# Patient Record
Sex: Female | Born: 1950
Health system: Southern US, Community
[De-identification: ages and names within clinical notes are randomized; demographics above are authoritative.]

## PROBLEM LIST (undated history)

## (undated) DIAGNOSIS — C20 Malignant neoplasm of rectum: Secondary | ICD-10-CM

## (undated) DIAGNOSIS — K921 Melena: Secondary | ICD-10-CM

## (undated) DIAGNOSIS — G4700 Insomnia, unspecified: Secondary | ICD-10-CM

## (undated) DIAGNOSIS — M1990 Unspecified osteoarthritis, unspecified site: Secondary | ICD-10-CM

## (undated) DIAGNOSIS — Z8042 Family history of malignant neoplasm of prostate: Secondary | ICD-10-CM

## (undated) DIAGNOSIS — R51 Headache: Secondary | ICD-10-CM

## (undated) DIAGNOSIS — Z5189 Encounter for other specified aftercare: Secondary | ICD-10-CM

## (undated) DIAGNOSIS — Z803 Family history of malignant neoplasm of breast: Secondary | ICD-10-CM

## (undated) DIAGNOSIS — Z800 Family history of malignant neoplasm of digestive organs: Secondary | ICD-10-CM

## (undated) DIAGNOSIS — R519 Headache, unspecified: Secondary | ICD-10-CM

## (undated) DIAGNOSIS — G8929 Other chronic pain: Secondary | ICD-10-CM

## (undated) DIAGNOSIS — N189 Chronic kidney disease, unspecified: Secondary | ICD-10-CM

## (undated) DIAGNOSIS — T7840XA Allergy, unspecified, initial encounter: Secondary | ICD-10-CM

## (undated) HISTORY — DX: Encounter for other specified aftercare: Z51.89

## (undated) HISTORY — DX: Family history of malignant neoplasm of breast: Z80.3

## (undated) HISTORY — DX: Headache, unspecified: R51.9

## (undated) HISTORY — DX: Allergy, unspecified, initial encounter: T78.40XA

## (undated) HISTORY — DX: Melena: K92.1

## (undated) HISTORY — DX: Unspecified osteoarthritis, unspecified site: M19.90

## (undated) HISTORY — DX: Family history of malignant neoplasm of prostate: Z80.42

## (undated) HISTORY — DX: Insomnia, unspecified: G47.00

## (undated) HISTORY — DX: Family history of malignant neoplasm of digestive organs: Z80.0

## (undated) HISTORY — PX: TONSILLECTOMY AND ADENOIDECTOMY: SUR1326

## (undated) HISTORY — PX: POLYPECTOMY: SHX149

## (undated) HISTORY — DX: Other chronic pain: G89.29

## (undated) HISTORY — PX: COLONOSCOPY: SHX174

## (undated) HISTORY — DX: Headache: R51

## (undated) HISTORY — PX: WISDOM TOOTH EXTRACTION: SHX21

## (undated) HISTORY — DX: Chronic kidney disease, unspecified: N18.9

---
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
## 1998-04-06 ENCOUNTER — Other Ambulatory Visit: Admission: RE | Admit: 1998-04-06 | Discharge: 1998-04-06 | Payer: Self-pay | Admitting: Obstetrics and Gynecology

## 1998-05-05 ENCOUNTER — Other Ambulatory Visit: Admission: RE | Admit: 1998-05-05 | Discharge: 1998-05-05 | Payer: Self-pay | Admitting: *Deleted

## 1998-05-13 ENCOUNTER — Other Ambulatory Visit: Admission: RE | Admit: 1998-05-13 | Discharge: 1998-05-13 | Payer: Self-pay | Admitting: Obstetrics and Gynecology

## 1999-03-17 ENCOUNTER — Encounter: Payer: Self-pay | Admitting: Family Medicine

## 1999-03-17 ENCOUNTER — Encounter: Admission: RE | Admit: 1999-03-17 | Discharge: 1999-03-17 | Payer: Self-pay | Admitting: Family Medicine

## 1999-05-04 ENCOUNTER — Other Ambulatory Visit: Admission: RE | Admit: 1999-05-04 | Discharge: 1999-05-04 | Payer: Self-pay | Admitting: Obstetrics and Gynecology

## 1999-05-04 ENCOUNTER — Encounter (INDEPENDENT_AMBULATORY_CARE_PROVIDER_SITE_OTHER): Payer: Self-pay

## 2000-06-20 ENCOUNTER — Other Ambulatory Visit: Admission: RE | Admit: 2000-06-20 | Discharge: 2000-06-20 | Payer: Self-pay | Admitting: Obstetrics and Gynecology

## 2001-07-02 ENCOUNTER — Other Ambulatory Visit: Admission: RE | Admit: 2001-07-02 | Discharge: 2001-07-02 | Payer: Self-pay | Admitting: Obstetrics and Gynecology

## 2003-03-09 ENCOUNTER — Other Ambulatory Visit: Admission: RE | Admit: 2003-03-09 | Discharge: 2003-03-09 | Payer: Self-pay | Admitting: Obstetrics and Gynecology

## 2004-03-09 ENCOUNTER — Other Ambulatory Visit: Admission: RE | Admit: 2004-03-09 | Discharge: 2004-03-09 | Payer: Self-pay | Admitting: Obstetrics and Gynecology

## 2008-01-28 ENCOUNTER — Ambulatory Visit: Payer: Self-pay | Admitting: Gastroenterology

## 2008-02-10 ENCOUNTER — Ambulatory Visit: Payer: Self-pay | Admitting: Gastroenterology

## 2010-01-02 DIAGNOSIS — Z5189 Encounter for other specified aftercare: Secondary | ICD-10-CM

## 2010-01-02 HISTORY — DX: Encounter for other specified aftercare: Z51.89

## 2010-01-02 HISTORY — PX: COLON SURGERY: SHX602

## 2010-07-25 ENCOUNTER — Encounter: Payer: Self-pay | Admitting: *Deleted

## 2010-07-25 ENCOUNTER — Telehealth: Payer: Self-pay | Admitting: Gastroenterology

## 2010-07-25 NOTE — Telephone Encounter (Signed)
Pt's last COLON 02/10/2008 without any problems or polyps, only family hx as a risk factor. Pt reports she's had a very tiny amount of blood from her rectum and her GYN thought it might be a fistula or fissure. Pt denies any pain and asked for an appt in late August and stated she will cancel if she gets better. Pt given an appt for 08/26/10 at 0900 and encouraged to keep it d/t the bleeding- new onset. Pt stated understanding.

## 2010-08-26 ENCOUNTER — Ambulatory Visit: Payer: Self-pay | Admitting: Gastroenterology

## 2010-09-02 ENCOUNTER — Encounter: Payer: Self-pay | Admitting: Gastroenterology

## 2010-09-02 ENCOUNTER — Ambulatory Visit (INDEPENDENT_AMBULATORY_CARE_PROVIDER_SITE_OTHER): Payer: BC Managed Care – PPO | Admitting: Gastroenterology

## 2010-09-02 VITALS — BP 110/76 | HR 96 | Ht 66.0 in | Wt 135.2 lb

## 2010-09-02 DIAGNOSIS — K625 Hemorrhage of anus and rectum: Secondary | ICD-10-CM

## 2010-09-02 DIAGNOSIS — Z800 Family history of malignant neoplasm of digestive organs: Secondary | ICD-10-CM | POA: Insufficient documentation

## 2010-09-02 NOTE — Patient Instructions (Signed)
Please go to the basement today for your labs.  Make sure you return the Ifob kit within 30 days or you will be charged for the kit.

## 2010-09-02 NOTE — Progress Notes (Signed)
This is a very pleasant 60 year old Caucasian female with a family history of colon cancer and colon polyps in several family members at elderly ages. She's had 2 negative colonoscopies, the last one performed 2 years ago. She comes in today because of some asymptomatic rectal bleeding several weeks ago. She currently is asymptomatic and denies rectal or abdominal pain, or any symptoms of anemia, upper GI or hepatobiliary complaints. Her appetite is good and her weight is stable. She has chronic headaches but otherwise is in excellent health. She has yearly physical exams by Dr. Theadora Rama and Dr. Marcelle Overlie in gynecology.  Current Medications, Allergies, Past Medical History, Past Surgical History, Family History and Social History were reviewed in [employer] record.  Pertinent Review of Systems Negative   Physical Exam: Awake alert no acute distress appearing her stated age. I cannot appreciate stigmata of chronic liver disease. Chest is clear cardiac exam is unremarkable with a regular rhythm. There is no organomegaly, abdominal masses or tenderness. Inspection of rectum is unremarkable as is rectal exam. There is solid stool present which is guaiac negative. Extremities unremarkable, and mental status is normal.    Assessment and Plan: Probable minor bleeding from internal hemorrhoids. We will check IFOB course for human hemoglobin in her stool, also CBC and anemia profile. I do not think she needs colonoscopy at this time unless her bleeding continues or her stool cards are positive. Otherwise we'll followup her colonoscopy as per clinical protocol.  Please copy her primary care physician, referring physician, and pertinent subspecialists. Encounter Diagnosis  Name Primary?  . Rectal bleeding Yes

## 2010-09-20 ENCOUNTER — Telehealth: Payer: Self-pay | Admitting: Gastroenterology

## 2010-09-20 NOTE — Telephone Encounter (Signed)
Pt called in to report another episode of blood in her stool. Pt saw Dr Jarold Motto on 09/02/10 for rectal bleeding. She has had 2 - COLONS before and Dr Jarold Motto was waiting on IFOB testing and labs before making a decision. Pt reports she had her labs faxed to [place]- they are under MEDIA from [place]. Called the lab and d/t the system being down this am, they are running behind. They hope her results will be ready tomorrow; they do have her cards. Explained this to pt. She reports she went for days w/o any blood, but yesterday, the blood was intertwined in her stool. Will call after discussing lab results with Dr Jarold Motto; pt stated understanding.

## 2010-09-22 ENCOUNTER — Other Ambulatory Visit: Payer: Self-pay | Admitting: Gastroenterology

## 2010-09-22 ENCOUNTER — Telehealth: Payer: Self-pay | Admitting: *Deleted

## 2010-09-22 ENCOUNTER — Other Ambulatory Visit: Payer: BC Managed Care – PPO

## 2010-09-22 DIAGNOSIS — K625 Hemorrhage of anus and rectum: Secondary | ICD-10-CM

## 2010-09-22 NOTE — Telephone Encounter (Signed)
Informed pt Dr Jarold Motto suggests ECL for pt. She will have her PRE VISIT on 09/29/10 at 0800 and her ECL on 10/17/10 at 0900. Pt stated understanding.

## 2010-09-22 NOTE — Telephone Encounter (Signed)
IFOB results were not read on 09/20/10. Pt call this am and results were positive per lab tech, but not entered; tech will have them resulted. Notified pt I will call her back after speaking with Dr Jarold Motto.  Dr Jarold Motto, pt's labs are under "MEDIA" and take the filter off.  Pt reports blood intertwined in the stool Monday and Tuesday of this week. Per 09/02/10 OV: Assessment and Plan: Probable minor bleeding from internal hemorrhoids. We will check IFOB course for human hemoglobin in her stool, also CBC and anemia profile. I do not think she needs colonoscopy at this time unless her bleeding continues or her stool cards are positive. Otherwise we'll followup her colonoscopy as per clinical protocol.  Please advise.

## 2010-09-22 NOTE — Telephone Encounter (Signed)
Message copied by Florene Glen on Thu Sep 22, 2010  3:54 PM ------      Message from: Jarold Motto, DAVID R      Created: Thu Sep 22, 2010 11:46 AM       ++ stool..needs endo/colon

## 2010-10-11 ENCOUNTER — Ambulatory Visit (AMBULATORY_SURGERY_CENTER): Payer: BC Managed Care – PPO

## 2010-10-11 VITALS — Ht 66.0 in | Wt 136.1 lb

## 2010-10-11 DIAGNOSIS — Z800 Family history of malignant neoplasm of digestive organs: Secondary | ICD-10-CM

## 2010-10-11 DIAGNOSIS — K625 Hemorrhage of anus and rectum: Secondary | ICD-10-CM

## 2010-10-11 MED ORDER — PEG-KCL-NACL-NASULF-NA ASC-C 100 G PO SOLR: 1.0000 | Freq: Once | ORAL | Status: AC

## 2010-10-17 ENCOUNTER — Ambulatory Visit (AMBULATORY_SURGERY_CENTER): Payer: BC Managed Care – PPO | Admitting: Gastroenterology

## 2010-10-17 ENCOUNTER — Encounter: Payer: Self-pay | Admitting: Gastroenterology

## 2010-10-17 VITALS — BP 122/75 | HR 77 | Temp 98.4°F | Resp 15 | Ht 66.0 in | Wt 136.0 lb

## 2010-10-17 DIAGNOSIS — D371 Neoplasm of uncertain behavior of stomach: Secondary | ICD-10-CM

## 2010-10-17 DIAGNOSIS — Z8371 Family history of colonic polyps: Secondary | ICD-10-CM

## 2010-10-17 DIAGNOSIS — D375 Neoplasm of uncertain behavior of rectum: Secondary | ICD-10-CM

## 2010-10-17 DIAGNOSIS — K625 Hemorrhage of anus and rectum: Secondary | ICD-10-CM

## 2010-10-17 DIAGNOSIS — Z800 Family history of malignant neoplasm of digestive organs: Secondary | ICD-10-CM

## 2010-10-17 DIAGNOSIS — D126 Benign neoplasm of colon, unspecified: Secondary | ICD-10-CM

## 2010-10-17 DIAGNOSIS — D378 Neoplasm of uncertain behavior of other specified digestive organs: Secondary | ICD-10-CM

## 2010-10-17 MED ORDER — SODIUM CHLORIDE 0.9 % IV SOLN: 500.0000 mL | INTRAVENOUS | Status: DC

## 2010-10-17 NOTE — Patient Instructions (Signed)
Please review discharge instructions (blue and green sheets)  Await pathology- Dr. Jarold Motto will get in touch with you either via letter or phone call as soon as we receive the pathology results.  He will also instruct you as to when you will need to have another procedure for this polyp  Resume normal medications

## 2010-10-17 NOTE — Progress Notes (Signed)
Pt passed large amount of air while in RR

## 2010-10-18 ENCOUNTER — Telehealth: Payer: Self-pay | Admitting: *Deleted

## 2010-10-18 NOTE — Telephone Encounter (Signed)
No answer. No ID on answering machine, no message left. 

## 2010-10-19 ENCOUNTER — Telehealth: Payer: Self-pay | Admitting: Gastroenterology

## 2010-10-19 DIAGNOSIS — K6289 Other specified diseases of anus and rectum: Secondary | ICD-10-CM

## 2010-10-19 NOTE — Telephone Encounter (Signed)
lmom for pt to call back. Polyp found was an adenoma that are sometimes pre cancerous and require more frequent observation- more frequent COLONs , 3 or 5 year surveillance instead of 10 years. The path report usually takes 5-7 days for completion; pt may call back for more questions.

## 2010-10-20 NOTE — Telephone Encounter (Signed)
Pt did not call back; we will call her when path is back.

## 2010-10-21 ENCOUNTER — Telehealth: Payer: Self-pay | Admitting: *Deleted

## 2010-10-21 NOTE — Telephone Encounter (Signed)
Phoned pt and she prefers Dr Abbey Chatters; r/s for 11/15/10 at 1:40pm. Pt will pick up contrast with instructions today.

## 2010-10-21 NOTE — Telephone Encounter (Signed)
NO CONCERN..SHE MAY SEE HEME.Marland KitchenMarland Kitchen

## 2010-10-21 NOTE — Telephone Encounter (Signed)
Per Dr Jarold Motto, order CT Abd/Pelvis and make a surgical referral; Dr Jarold Motto spoke with the pt. CT scan ordered for Monday, 10/24/10 at 0900am, NPO 4 hrs prior, Drink contast 1 bottle at 7am and the 2nd at 8am. Surgical referral with Dr Rayburn Ma for 10/25/10 at 0920am.

## 2010-10-21 NOTE — Telephone Encounter (Signed)
Pt stated dr Jarold Motto asked her this am if she had seen any blood and she reported no. At lunch, after her BM, there were small clots on the tissue and the BM was streaked with blood. Any advice? Thanks.

## 2010-10-24 ENCOUNTER — Ambulatory Visit (INDEPENDENT_AMBULATORY_CARE_PROVIDER_SITE_OTHER)
Admission: RE | Admit: 2010-10-24 | Discharge: 2010-10-24 | Disposition: A | Discharge status: Home / Self Care | Payer: BC Managed Care – PPO | Source: Ambulatory Visit | Attending: Gastroenterology | Admitting: Gastroenterology

## 2010-10-24 DIAGNOSIS — K6289 Other specified diseases of anus and rectum: Secondary | ICD-10-CM

## 2010-10-24 DIAGNOSIS — R198 Other specified symptoms and signs involving the digestive system and abdomen: Secondary | ICD-10-CM

## 2010-10-24 MED ORDER — IOHEXOL 300 MG/ML  SOLN
100.0000 mL | Freq: Once | INTRAMUSCULAR | Status: AC | PRN
  Administered 2010-10-24: 80 mL via INTRAVENOUS

## 2010-10-24 NOTE — Telephone Encounter (Signed)
Pt called to report she has diarrhea after the contrast. She asked about her CT scan, but there was no mention of the mass in the colon. Dr Bonnielee Haff will lokk at the scan and append per Erskine Squibb at [phone].

## 2010-10-25 ENCOUNTER — Ambulatory Visit (INDEPENDENT_AMBULATORY_CARE_PROVIDER_SITE_OTHER): Payer: Self-pay | Admitting: Surgery

## 2010-10-25 NOTE — Telephone Encounter (Addendum)
Dr Bonnielee Haff reviewed and made an addendum to the CT scan stating, "the rectal mass is not visualized". Notified the pt of this and she did not like the answer stating she did not understand how a 5cm mass could not show up on a CT scan. I could not reach a Radiologist in the viewing room, so I consulted Dr Rhea Belton who informed me that is why they use Colonoscopies for screening Colon Cancer rather than CT scan. Informed the pt of this and she stated it's hard for her to believe a 5cm mass did not show up. Explained to pt Dr Jarold Motto could not get the entire mass safely thru a scope, that's why he wants a surgeon to remove it entirely. The biopsies- 4 of them- were large and we don't know how much of the mass is left. We do not want to tell it's not cancer if we're uncertain. Pt still did not like my answers and I understand the pt's concern; Dr Rhea Belton spoke with pt and was able to answer her questions.

## 2010-11-01 ENCOUNTER — Ambulatory Visit (INDEPENDENT_AMBULATORY_CARE_PROVIDER_SITE_OTHER): Payer: BC Managed Care – PPO | Admitting: Surgery

## 2010-11-01 ENCOUNTER — Encounter (INDEPENDENT_AMBULATORY_CARE_PROVIDER_SITE_OTHER): Payer: Self-pay | Admitting: Surgery

## 2010-11-01 VITALS — BP 108/82 | HR 70 | Temp 98.1°F | Resp 20 | Ht 66.0 in | Wt 136.1 lb

## 2010-11-01 DIAGNOSIS — C20 Malignant neoplasm of rectum: Secondary | ICD-10-CM | POA: Insufficient documentation

## 2010-11-01 DIAGNOSIS — D128 Benign neoplasm of rectum: Secondary | ICD-10-CM

## 2010-11-01 HISTORY — DX: Malignant neoplasm of rectum: C20

## 2010-11-01 NOTE — Progress Notes (Signed)
Subjective:     Patient ID: Natasha Campbell, female   DOB: 1950/02/29, 60 y.o.   MRN: [id number]  HPI  Patient Care Team: Charolett Bumpers as PCP - General (Unknown Physician Specialty) Sheryn Bison, MD as Consulting Physician (Gastroenterology) Ardeth Sportsman, MD as Consulting Physician (General Surgery)  This patient is a 60 y.o.female who presents today for surgical evaluation.   Reason for visit: Rectal polyp with high-grade dysplasia in the proximal rectum. Consideration of surgery.  The patient is a pleasant healthy woman who had intermittent rectal bleeding for the past few months. It worsened. She had a normal colonoscopy 3 years ago. She was begun earlier this month. Dr. Jarold Motto found a 4.5 cm mass in the proximal rectum near the superior rectal fold.  He was able to reduce some of the piecemeal. Pathology showed tubulovillous adenoma with high-grade dysplasia. He sent the patient to me for surgical evaluation.  Patient walks several miles w/o difficulty. She normally has a bowel movement about every day. No prior abdominal surgeries. She comes today with her husband. There is family history of GI cancer. Her. No history of breast ovarian or other GYN cancers. Otherwise quite healthy and active.  Past Medical History  Diagnosis Date  . Family history of malignant neoplasm of gastrointestinal tract   . Chronic headaches   . Arthritis   . Blood in stool     Past Surgical History  Procedure Date  . Tonsillectomy and adenoidectomy     History   Social History  . Marital Status: Married    Spouse Name: N/A    Number of Children: 2  . Years of Education: N/A   Occupational History  .  [employer]   Social History Main Topics  . Smoking status: Never Smoker   . Smokeless tobacco: Never Used  . Alcohol Use: No  . Drug Use: No  . Sexually Active: Not on file   Other Topics Concern  . Not on file   Social History Narrative  . No narrative on file    Family  History  Problem Relation Age of Onset  . Colon cancer Paternal Grandmother   . Colon cancer Paternal Grandfather   . Colon cancer Maternal Grandmother   . Heart disease Mother   . Heart disease Father     Current outpatient prescriptions:Aspirin-Acetaminophen-Caffeine (EXCEDRIN PO), Take 1 tablet by mouth as needed.  , Disp: , Rfl: ;  Calcium Citrate-Vitamin D (CALCIUM CITRATE + PO), Take by mouth daily. Take 1200 mg daily , Disp: , Rfl: ;  cholecalciferol (VITAMIN D) 1000 UNITS tablet, Take 1,000 Units by mouth daily.  , Disp: , Rfl: ;  Coenzyme Q10 (CO Q-10 PO), Take by mouth daily. Take 100 mg daily , Disp: , Rfl:  fish oil-omega-3 fatty acids 1000 MG capsule, Take 1 g by mouth daily. Take 1200 mg daily , Disp: , Rfl: ;  imipramine (TOFRANIL) 10 MG tablet, Take 10 mg by mouth at bedtime.  , Disp: , Rfl: ;  Magnesium 500 MG CAPS, Take by mouth daily.  , Disp: , Rfl: ;  Multiple Vitamin (MULTIVITAMIN) tablet, Take 1 tablet by mouth daily.  , Disp: , Rfl: ;  NON FORMULARY, Zonisamide 150 mg 1 tablet daily , Disp: , Rfl:  NON FORMULARY, Flurbiprofen 100 1 as needed  , Disp: , Rfl: ;  rizatriptan (MAXALT) 10 MG tablet, Take 10 mg by mouth as needed. May repeat in 2 hours if needed , Disp: ,  Rfl: ;  Selenium 100 MCG TABS, Take by mouth daily.  , Disp: , Rfl: ;  vitamin E 100 UNIT capsule, Take 100 Units by mouth daily.  , Disp: , Rfl: ;  zolpidem (AMBIEN) 10 MG tablet, Take 10 mg by mouth at bedtime as needed.  , Disp: , Rfl:   No Known Allergies     Review of Systems  Constitutional: Negative for fever, chills, diaphoresis, appetite change and fatigue.  HENT: Negative for ear pain, sore throat, trouble swallowing, neck pain and ear discharge.   Eyes: Negative for photophobia, discharge and visual disturbance.  Respiratory: Negative for cough, choking, chest tightness and shortness of breath.   Cardiovascular: Negative for chest pain and palpitations.  Gastrointestinal: Positive for blood in  stool. Negative for nausea, vomiting, abdominal pain, diarrhea, constipation, abdominal distention, anal bleeding and rectal pain.       BM q 1-2d  Genitourinary: Negative for dysuria, frequency and difficulty urinating.  Musculoskeletal: Negative for myalgias and gait problem.  Skin: Negative for color change, pallor and rash.  Neurological: Negative for dizziness, speech difficulty, weakness and numbness.  Hematological: Negative for adenopathy.  Psychiatric/Behavioral: Negative for confusion and agitation. The patient is not nervous/anxious.        Objective:   Physical Exam  Constitutional: She is oriented to person, place, and time. She appears well-developed and well-nourished. No distress.  HENT:  Head: Normocephalic.  Mouth/Throat: Oropharynx is clear and moist. No oropharyngeal exudate.  Eyes: Conjunctivae and EOM are normal. Pupils are equal, round, and reactive to light. No scleral icterus.  Neck: Normal range of motion. Neck supple. No tracheal deviation present.  Cardiovascular: Normal rate, regular rhythm and intact distal pulses.   Pulmonary/Chest: Effort normal and breath sounds normal. No respiratory distress. She exhibits no tenderness.  Abdominal: Soft. She exhibits no distension and no mass. There is no tenderness. Hernia confirmed negative in the right inguinal area and confirmed negative in the left inguinal area.  Genitourinary: Vagina normal. No vaginal discharge found.       Perianal skin clean with good hygiene.  Mild hypopigmentation. No pruritis/rash.  No fissure.  No abscess/fistula.  No pilonidal disease.    Tolerates digital rectal exam.  Normal sphincter tone.  No rectal masses.  Mild fullness at tip anterior ly ?cervix.  Hemorrhoidal piles WNL   Musculoskeletal: Normal range of motion. She exhibits no tenderness.  Lymphadenopathy:    She has no cervical adenopathy.       Right: No inguinal adenopathy present.       Left: No inguinal adenopathy present.    Neurological: She is alert and oriented to person, place, and time. No cranial nerve deficit. She exhibits normal muscle tone. Coordination normal.  Skin: Skin is warm and dry. No rash noted. She is not diaphoretic. No erythema.  Psychiatric: She has a normal mood and affect. Her behavior is normal. Judgment and thought content normal.       Assessment:     Large proximal rectal polyp with high-grade dysplasia. Need for more definitive removal.    Plan:     Given that I cannot feel on digital exam and is quite proximal, I do not think she would be a good candidate for TEM. I think she would benefit from segmental rectosigmoid resection. The CAT scan does not show any evidence of metastatic disease. I think I see the fullness up in the mid body of the uterus implying that this is quite at the rectosigmoid  junction. I worried a peritoneal breech I tried to do it by TEM.  I did discuss doing a low anterior resection. I think should be an excellent candidate for laparoscopic technique. She had numerous questions and her husband as well. I believe I answered to their satisfaction.  The anatomy & physiology of the digestive tract was discussed.  The pathophysiology was discussed.  Natural history risks without surgery was discussed.   I feel the risks of no intervention will lead to serious problems that outweigh the operative risks; therefore, I recommended a partial colectomy to remove the pathology.  Laparoscopic & open techniques were discussed.   Risks such as bleeding, infection, abscess, leak, reoperation, possible ostomy, hernia, heart attack, death, and other risks were discussed.  Goals of post-operative recovery were discussed as well.  We will work to minimize complications.  An educational handout on the pathology was given as well.  Questions were answered.  The patient expresses understanding & wishes to proceed with surgery.

## 2010-11-01 NOTE — Patient Instructions (Signed)
Colon Polyps A polyp is extra tissue that grows inside your body. Colon polyps grow in the large intestine. The large intestine, also called the colon, is part of your digestive system. It is a long, hollow tube at the end of your digestive tract where your body makes and stores stool. Most polyps are not dangerous. They are benign. This means they are not cancerous. But over time, some types of polyps can turn into cancer. Polyps that are smaller than a pea are usually not harmful. But larger polyps could someday become or may already be cancerous. To be safe, doctors remove all polyps and test them.  WHO GETS POLYPS? Anyone can get polyps, but certain people are more likely than others. You may have a greater chance of getting polyps if:  You are over 50.   You have had polyps before.   Someone in your family has had polyps.   Someone in your family has had cancer of the large intestine.   Find out if someone in your family has had polyps. You may also be more likely to get polyps if you:   Eat a lot of fatty foods.   Smoke.   Drink alcohol.   Do not exercise.   Eat too much.  SYMPTOMS  Most small polyps do not cause symptoms. People often do not know they have one until their caregiver finds it during a regular checkup or while testing them for something else. Some people do have symptoms like these:  Bleeding from the anus. You might notice blood on your underwear or on toilet paper after you have had a bowel movement.   Constipation or diarrhea that lasts more than a week.   Blood in the stool. Blood can make stool look black or it can show up as red streaks in the stool.  If you have any of these symptoms, see your caregiver. HOW DOES THE DOCTOR TEST FOR POLYPS? The doctor can use four tests to check for polyps:  Digital rectal exam. The caregiver wears gloves and checks your rectum (the last part of the large intestine) to see if it feels normal. This test would find  polyps only in the rectum. Your caregiver may need to do one of the other tests listed below to find polyps higher up in the intestine.   Barium enema. The caregiver puts a liquid called barium into your rectum before taking x-rays of your large intestine. Barium makes your intestine look white in the pictures. Polyps are dark, so they are easy to see.   Sigmoidoscopy. With this test, the caregiver can see inside your large intestine. A thin flexible tube is placed into your rectum. The device is called a sigmoidoscope, which has a light and a tiny video camera in it. The caregiver uses the sigmoidoscope to look at the last third of your large intestine.   Colonoscopy. This test is like sigmoidoscopy, but the caregiver looks at all of the large intestine. It usually requires sedation. This is the most common method for finding and removing polyps.  TREATMENT   The caregiver will remove the polyp during sigmoidoscopy or colonoscopy. The polyp is then tested for cancer.   If you have had polyps, your caregiver may want you to get tested regularly in the future.  PREVENTION  There is not one sure way to prevent polyps. You might be able to lower your risk of getting them if you:  Eat more fruits and vegetables and less fatty   food.   Do not smoke.   Avoid alcohol.   Exercise every day.   Lose weight if you are overweight.   Eating more calcium and folate can also lower your risk of getting polyps. Some foods that are rich in calcium are milk, cheese, and broccoli. Some foods that are rich in folate are chickpeas, kidney beans, and spinach.   Aspirin might help prevent polyps. Studies are under way.  Document Released: 09/15/2003 Document Revised: 08/31/2010 Document Reviewed: 02/20/2007 [employer] Patient Information 2012 [employer]. 

## 2010-11-15 ENCOUNTER — Ambulatory Visit (INDEPENDENT_AMBULATORY_CARE_PROVIDER_SITE_OTHER): Payer: BC Managed Care – PPO | Admitting: General Surgery

## 2010-11-16 ENCOUNTER — Telehealth (INDEPENDENT_AMBULATORY_CARE_PROVIDER_SITE_OTHER): Payer: Self-pay

## 2010-11-16 NOTE — Telephone Encounter (Signed)
LMOM for pt to call me back about her message left on Monday for Dr Michaell Cowing [facility]

## 2010-11-21 ENCOUNTER — Encounter (HOSPITAL_COMMUNITY)
Admission: RE | Admit: 2010-11-21 | Discharge: 2010-11-21 | Disposition: A | Discharge status: Home / Self Care | Payer: BC Managed Care – PPO | Source: Ambulatory Visit | Attending: Surgery | Admitting: Surgery

## 2010-11-21 ENCOUNTER — Encounter (HOSPITAL_COMMUNITY): Payer: Self-pay

## 2010-11-21 HISTORY — DX: Headache: R51

## 2010-11-21 LAB — SURGICAL PCR SCREEN
MRSA, PCR: NEGATIVE
Staphylococcus aureus: POSITIVE — AB

## 2010-11-21 LAB — CBC
Hemoglobin: 13 g/dL (ref 12.0–15.0)
MCH: 29.7 pg (ref 26.0–34.0)
Platelets: 191 10*3/uL (ref 150–400)
RBC: 4.37 MIL/uL (ref 3.87–5.11)
WBC: 4.7 10*3/uL (ref 4.0–10.5)

## 2010-11-21 NOTE — Patient Instructions (Signed)
20 Natasha Campbell  11/21/2010   Your procedure is scheduled on:  Fri. 12/02/2010  Report to [facility] at 0530 AM.  Call this number if you have problems the morning of surgery: [phone]   Remember:Follow rectal prep from Dr. Gordy Savers office    Do not eat food:After Midnight.  Do not drink clear liquids: After Midnight.  Take these medicines the morning of surgery with A SIP OF WATER: NONE   Do not wear jewelry, make-up or nail polish.  Do not wear lotions, powders, or perfumes.   Do not shave 48 hours prior to surgery.  Do not bring valuables to the hospital.  Contacts, dentures or bridgework may not be worn into surgery.  Leave suitcase in the car. After surgery it may be brought to your room.  For patients admitted to the hospital, checkout time is 11:00 AM the day of discharge.   Patients discharged the day of surgery will not be allowed to drive home.  Name and phone number of your driver:   Special Instructions: CHG Shower Use Special Wash: 1/2 bottle night before surgery and 1/2 bottle morning of surgery.   Please read over the following fact sheets that you were given: MRSA Information

## 2010-11-22 ENCOUNTER — Other Ambulatory Visit (HOSPITAL_COMMUNITY): Payer: BC Managed Care – PPO

## 2010-12-01 MED ORDER — BUPIVACAINE 0.25 % ON-Q PUMP DUAL CATH 300 ML
300.0000 mL | INJECTION | Status: DC
  Administered 2010-12-02: 300 mL
  Filled 2010-12-01: qty 300

## 2010-12-01 MED ORDER — SODIUM CHLORIDE 0.9 % IV SOLN
1.0000 g | INTRAVENOUS | Status: DC
  Filled 2010-12-01: qty 1

## 2010-12-01 NOTE — Anesthesia Preprocedure Evaluation (Addendum)
Anesthesia Evaluation  Patient identified by MRN, date of birth, ID band Patient awake    Reviewed: Allergy & Precautions, H&P , NPO status , Patient's Chart, lab work & pertinent test results  Airway Mallampati: II TM Distance: >3 FB Neck ROM: Full    Dental No notable dental hx. (+) Teeth Intact and Dental Advisory Given   Pulmonary neg pulmonary ROS, Recent URI ,  clear to auscultation  Pulmonary exam normal       Cardiovascular neg cardio ROS Regular Normal    Neuro/Psych  Headaches, Negative Neurological ROS  Negative Psych ROS   GI/Hepatic negative GI ROS, Neg liver ROS,   Endo/Other  Negative Endocrine ROS  Renal/GU negative Renal ROS  Genitourinary negative   Musculoskeletal negative musculoskeletal ROS (+)   Abdominal   Peds negative pediatric ROS (+)  Hematology negative hematology ROS (+)   Anesthesia Other Findings   Reproductive/Obstetrics negative OB ROS                        Anesthesia Physical Anesthesia Plan  ASA: I  Anesthesia Plan: General   Post-op Pain Management:    Induction: Intravenous  Airway Management Planned: Oral ETT  Additional Equipment:   Intra-op Plan:   Post-operative Plan: Extubation in OR  Informed Consent: I have reviewed the patients History and Physical, chart, labs and discussed the procedure including the risks, benefits and alternatives for the proposed anesthesia with the patient or authorized representative who has indicated his/her understanding and acceptance.   Dental advisory given  Plan Discussed with: CRNA and Surgeon  Anesthesia Plan Comments:        Anesthesia Quick Evaluation

## 2010-12-02 ENCOUNTER — Encounter (HOSPITAL_COMMUNITY): Payer: Self-pay | Admitting: *Deleted

## 2010-12-02 ENCOUNTER — Other Ambulatory Visit (INDEPENDENT_AMBULATORY_CARE_PROVIDER_SITE_OTHER): Payer: Self-pay | Admitting: Surgery

## 2010-12-02 ENCOUNTER — Encounter (HOSPITAL_COMMUNITY): Payer: Self-pay | Admitting: Anesthesiology

## 2010-12-02 ENCOUNTER — Inpatient Hospital Stay (HOSPITAL_COMMUNITY)
Admission: RE | Admit: 2010-12-02 | Discharge: 2010-12-06 | DRG: 146 | Disposition: A | Discharge status: Home / Self Care | Payer: BC Managed Care – PPO | Source: Ambulatory Visit | Attending: Surgery | Admitting: Surgery

## 2010-12-02 ENCOUNTER — Encounter (HOSPITAL_COMMUNITY)
Admission: RE | Disposition: A | Discharge status: Home / Self Care | Payer: Self-pay | Source: Ambulatory Visit | Attending: Surgery

## 2010-12-02 ENCOUNTER — Inpatient Hospital Stay (HOSPITAL_COMMUNITY): Payer: BC Managed Care – PPO | Admitting: Anesthesiology

## 2010-12-02 DIAGNOSIS — Z800 Family history of malignant neoplasm of digestive organs: Secondary | ICD-10-CM

## 2010-12-02 DIAGNOSIS — D128 Benign neoplasm of rectum: Secondary | ICD-10-CM

## 2010-12-02 DIAGNOSIS — K620 Anal polyp: Secondary | ICD-10-CM | POA: Diagnosis present

## 2010-12-02 DIAGNOSIS — K625 Hemorrhage of anus and rectum: Secondary | ICD-10-CM

## 2010-12-02 DIAGNOSIS — D62 Acute posthemorrhagic anemia: Secondary | ICD-10-CM | POA: Diagnosis present

## 2010-12-02 DIAGNOSIS — C20 Malignant neoplasm of rectum: Secondary | ICD-10-CM | POA: Insufficient documentation

## 2010-12-02 DIAGNOSIS — M129 Arthropathy, unspecified: Secondary | ICD-10-CM | POA: Diagnosis present

## 2010-12-02 DIAGNOSIS — C189 Malignant neoplasm of colon, unspecified: Secondary | ICD-10-CM

## 2010-12-02 HISTORY — PX: COLON RESECTION: SHX5231

## 2010-12-02 HISTORY — DX: Malignant neoplasm of rectum: C20

## 2010-12-02 SURGERY — COLON RESECTION LAPAROSCOPIC
Anesthesia: General | Wound class: Contaminated

## 2010-12-02 MED ORDER — HYDROMORPHONE BOLUS VIA INFUSION
0.5000 mg | INTRAVENOUS | Status: DC | PRN
  Filled 2010-12-02: qty 200

## 2010-12-02 MED ORDER — NEOSTIGMINE METHYLSULFATE 1 MG/ML IJ SOLN
INTRAMUSCULAR | Status: DC | PRN
  Administered 2010-12-02: 3 mg via INTRAVENOUS

## 2010-12-02 MED ORDER — PROPOFOL 10 MG/ML IV EMUL
INTRAVENOUS | Status: DC | PRN
  Administered 2010-12-02: 150 mg via INTRAVENOUS

## 2010-12-02 MED ORDER — HYDROMORPHONE HCL PF 1 MG/ML IJ SOLN
INTRAMUSCULAR | Status: AC
  Administered 2010-12-02: 1 mg
  Filled 2010-12-02: qty 1

## 2010-12-02 MED ORDER — SODIUM CHLORIDE 0.9 % IV SOLN
INTRAVENOUS | Status: AC
  Filled 2010-12-02: qty 50

## 2010-12-02 MED ORDER — HETASTARCH-ELECTROLYTES 6 % IV SOLN
INTRAVENOUS | Status: DC | PRN
  Administered 2010-12-02: 10:00:00 via INTRAVENOUS

## 2010-12-02 MED ORDER — ONDANSETRON HCL 4 MG PO TABS: 4.0000 mg | ORAL_TABLET | Freq: Four times a day (QID) | ORAL | Status: DC | PRN

## 2010-12-02 MED ORDER — ZOLPIDEM TARTRATE 5 MG PO TABS: 5.0000 mg | ORAL_TABLET | Freq: Every evening | ORAL | Status: DC | PRN

## 2010-12-02 MED ORDER — PROMETHAZINE HCL 25 MG/ML IJ SOLN: 6.2500 mg | INTRAMUSCULAR | Status: DC | PRN

## 2010-12-02 MED ORDER — PHENOL 1.4 % MT LIQD: 2.0000 | OROMUCOSAL | Status: DC | PRN

## 2010-12-02 MED ORDER — ASPIRIN-ACETAMINOPHEN-CAFFEINE 250-250-65 MG PO TABS
1.0000 | ORAL_TABLET | Freq: Three times a day (TID) | ORAL | Status: DC | PRN
  Administered 2010-12-04: 1 via ORAL
  Filled 2010-12-02 (×2): qty 1

## 2010-12-02 MED ORDER — HEPARIN SODIUM (PORCINE) 5000 UNIT/ML IJ SOLN
5000.0000 [IU] | Freq: Once | INTRAMUSCULAR | Status: AC
  Administered 2010-12-02: 5000 [IU] via SUBCUTANEOUS

## 2010-12-02 MED ORDER — METOCLOPRAMIDE HCL 10 MG PO TABS: 5.0000 mg | ORAL_TABLET | Freq: Four times a day (QID) | ORAL | Status: DC | PRN

## 2010-12-02 MED ORDER — SODIUM CHLORIDE 0.9 % IV SOLN
1.0000 g | INTRAVENOUS | Status: DC | PRN
  Administered 2010-12-02: 1 g via INTRAVENOUS

## 2010-12-02 MED ORDER — LACTATED RINGERS IV SOLN
INTRAVENOUS | Status: DC
  Administered 2010-12-02 – 2010-12-04 (×3): via INTRAVENOUS

## 2010-12-02 MED ORDER — FLORA-Q PO CAPS
1.0000 | ORAL_CAPSULE | Freq: Every day | ORAL | Status: DC
  Administered 2010-12-02 – 2010-12-06 (×5): 1 via ORAL
  Filled 2010-12-02 (×5): qty 1

## 2010-12-02 MED ORDER — BUPIVACAINE-EPINEPHRINE 0.25% -1:200000 IJ SOLN
INTRAMUSCULAR | Status: DC | PRN
  Administered 2010-12-02: 50 mL

## 2010-12-02 MED ORDER — ACETAMINOPHEN 10 MG/ML IV SOLN
INTRAVENOUS | Status: AC
  Filled 2010-12-02: qty 100

## 2010-12-02 MED ORDER — ACETAMINOPHEN 325 MG PO TABS
650.0000 mg | ORAL_TABLET | Freq: Four times a day (QID) | ORAL | Status: DC
  Administered 2010-12-02 – 2010-12-06 (×16): 650 mg via ORAL
  Filled 2010-12-02 (×27): qty 2

## 2010-12-02 MED ORDER — LACTATED RINGERS IV BOLUS (SEPSIS): 1000.0000 mL | Freq: Four times a day (QID) | INTRAVENOUS | Status: AC | PRN

## 2010-12-02 MED ORDER — LACTATED RINGERS IV SOLN
INTRAVENOUS | Status: DC | PRN
  Administered 2010-12-02 (×3): via INTRAVENOUS

## 2010-12-02 MED ORDER — SODIUM CHLORIDE 0.9 % IR SOLN
Status: DC | PRN
  Administered 2010-12-02: 2000 mL

## 2010-12-02 MED ORDER — ALUM & MAG HYDROXIDE-SIMETH 400-400-40 MG/5ML PO SUSP
30.0000 mL | Freq: Four times a day (QID) | ORAL | Status: DC | PRN
  Filled 2010-12-02: qty 30

## 2010-12-02 MED ORDER — ZOLPIDEM TARTRATE 10 MG PO TABS: 10.0000 mg | ORAL_TABLET | Freq: Every evening | ORAL | Status: DC | PRN

## 2010-12-02 MED ORDER — METOCLOPRAMIDE HCL 5 MG/ML IJ SOLN: 5.0000 mg | Freq: Four times a day (QID) | INTRAMUSCULAR | Status: DC | PRN

## 2010-12-02 MED ORDER — LIP MEDEX EX OINT
1.0000 "application " | TOPICAL_OINTMENT | Freq: Two times a day (BID) | CUTANEOUS | Status: DC
  Administered 2010-12-03 – 2010-12-06 (×7): 1 via TOPICAL
  Filled 2010-12-02 (×2): qty 7

## 2010-12-02 MED ORDER — LACTATED RINGERS IR SOLN
Status: DC | PRN
  Administered 2010-12-02: 3000 mL

## 2010-12-02 MED ORDER — BUPIVACAINE-EPINEPHRINE 0.25% -1:200000 IJ SOLN
INTRAMUSCULAR | Status: AC
  Filled 2010-12-02: qty 1

## 2010-12-02 MED ORDER — OXYCODONE HCL 5 MG PO TABS: 5.0000 mg | ORAL_TABLET | ORAL | Status: AC | PRN

## 2010-12-02 MED ORDER — EPHEDRINE SULFATE 50 MG/ML IJ SOLN
INTRAMUSCULAR | Status: DC | PRN
  Administered 2010-12-02 (×6): 5 mg via INTRAVENOUS

## 2010-12-02 MED ORDER — IBUPROFEN 800 MG PO TABS
400.0000 mg | ORAL_TABLET | Freq: Four times a day (QID) | ORAL | Status: DC | PRN
  Administered 2010-12-03 – 2010-12-06 (×6): 800 mg via ORAL
  Filled 2010-12-02 (×3): qty 1
  Filled 2010-12-02 (×2): qty 2
  Filled 2010-12-02: qty 1
  Filled 2010-12-02: qty 2
  Filled 2010-12-02: qty 1

## 2010-12-02 MED ORDER — ALBUTEROL SULFATE (5 MG/ML) 0.5% IN NEBU: 2.5000 mg | INHALATION_SOLUTION | Freq: Four times a day (QID) | RESPIRATORY_TRACT | Status: DC | PRN

## 2010-12-02 MED ORDER — GUAIFENESIN-DM 100-10 MG/5ML PO SYRP: 15.0000 mL | ORAL_SOLUTION | ORAL | Status: DC | PRN

## 2010-12-02 MED ORDER — FENTANYL CITRATE 0.05 MG/ML IJ SOLN
INTRAMUSCULAR | Status: DC | PRN
  Administered 2010-12-02 (×5): 25 ug via INTRAVENOUS
  Administered 2010-12-02: 75 ug via INTRAVENOUS
  Administered 2010-12-02 (×2): 25 ug via INTRAVENOUS

## 2010-12-02 MED ORDER — OXYCODONE HCL 5 MG PO TABS
5.0000 mg | ORAL_TABLET | ORAL | Status: DC | PRN
  Administered 2010-12-02 (×2): 5 mg via ORAL
  Administered 2010-12-03: 10 mg via ORAL
  Administered 2010-12-03 – 2010-12-05 (×6): 5 mg via ORAL
  Filled 2010-12-02: qty 2
  Filled 2010-12-02 (×2): qty 1
  Filled 2010-12-02: qty 2
  Filled 2010-12-02 (×3): qty 1
  Filled 2010-12-02: qty 2
  Filled 2010-12-02: qty 1

## 2010-12-02 MED ORDER — ALVIMOPAN 12 MG PO CAPS
12.0000 mg | ORAL_CAPSULE | Freq: Once | ORAL | Status: AC
  Administered 2010-12-02: 12 mg via ORAL

## 2010-12-02 MED ORDER — MIDAZOLAM HCL 5 MG/5ML IJ SOLN
INTRAMUSCULAR | Status: DC | PRN
  Administered 2010-12-02 (×2): 1 mg via INTRAVENOUS
  Administered 2010-12-02: .5 mg via INTRAVENOUS

## 2010-12-02 MED ORDER — ONDANSETRON HCL 4 MG/2ML IJ SOLN: 4.0000 mg | Freq: Four times a day (QID) | INTRAMUSCULAR | Status: DC | PRN

## 2010-12-02 MED ORDER — MENTHOL 3 MG MT LOZG: 1.0000 | LOZENGE | OROMUCOSAL | Status: DC | PRN

## 2010-12-02 MED ORDER — SUCCINYLCHOLINE CHLORIDE 20 MG/ML IJ SOLN
INTRAMUSCULAR | Status: DC | PRN
  Administered 2010-12-02: 100 mg via INTRAVENOUS

## 2010-12-02 MED ORDER — GENTAMICIN SULFATE 0.1 % EX CREA
1.0000 "application " | TOPICAL_CREAM | Freq: Three times a day (TID) | CUTANEOUS | Status: DC
  Administered 2010-12-03: 1 via TOPICAL
  Filled 2010-12-02: qty 15

## 2010-12-02 MED ORDER — SUMATRIPTAN SUCCINATE 50 MG PO TABS: 50.0000 mg | ORAL_TABLET | ORAL | Status: DC | PRN

## 2010-12-02 MED ORDER — STERILE WATER FOR IRRIGATION IR SOLN
Status: DC | PRN
  Administered 2010-12-02: 1500 mL

## 2010-12-02 MED ORDER — ALVIMOPAN 12 MG PO CAPS
12.0000 mg | ORAL_CAPSULE | Freq: Two times a day (BID) | ORAL | Status: DC
  Administered 2010-12-03 – 2010-12-06 (×7): 12 mg via ORAL
  Filled 2010-12-02 (×8): qty 1

## 2010-12-02 MED ORDER — IMIPRAMINE HCL 10 MG PO TABS
20.0000 mg | ORAL_TABLET | Freq: Every day | ORAL | Status: DC
  Administered 2010-12-02 – 2010-12-05 (×4): 20 mg via ORAL
  Filled 2010-12-02 (×5): qty 2

## 2010-12-02 MED ORDER — KETAMINE HCL 10 MG/ML IJ SOLN
INTRAMUSCULAR | Status: DC | PRN
  Administered 2010-12-02: 1 mg via INTRAVENOUS
  Administered 2010-12-02: 2 mg via INTRAVENOUS
  Administered 2010-12-02: 1 mg via INTRAVENOUS
  Administered 2010-12-02: 2 mg via INTRAVENOUS
  Administered 2010-12-02: 1 mg via INTRAVENOUS
  Administered 2010-12-02: 2 mg via INTRAVENOUS
  Administered 2010-12-02: 1 mg via INTRAVENOUS

## 2010-12-02 MED ORDER — HYDROMORPHONE HCL PF 1 MG/ML IJ SOLN
0.5000 mg | INTRAMUSCULAR | Status: AC | PRN
  Administered 2010-12-02 (×4): 0.5 mg via INTRAVENOUS

## 2010-12-02 MED ORDER — ONDANSETRON HCL 4 MG/2ML IJ SOLN
INTRAMUSCULAR | Status: DC | PRN
  Administered 2010-12-02 (×2): 2 mg via INTRAVENOUS

## 2010-12-02 MED ORDER — GLYCOPYRROLATE 0.2 MG/ML IJ SOLN
INTRAMUSCULAR | Status: DC | PRN
  Administered 2010-12-02: .4 mg via INTRAVENOUS

## 2010-12-02 MED ORDER — BUPIVACAINE 0.25 % ON-Q PUMP DUAL CATH 300 ML
300.0000 mL | INJECTION | Status: AC
  Filled 2010-12-02: qty 300

## 2010-12-02 MED ORDER — CISATRACURIUM BESYLATE 2 MG/ML IV SOLN
INTRAVENOUS | Status: DC | PRN
  Administered 2010-12-02: 2 mg via INTRAVENOUS
  Administered 2010-12-02: 1 mg via INTRAVENOUS
  Administered 2010-12-02: 2 mg via INTRAVENOUS
  Administered 2010-12-02: 1 mg via INTRAVENOUS
  Administered 2010-12-02: 3 mg via INTRAVENOUS
  Administered 2010-12-02: 7 mg via INTRAVENOUS

## 2010-12-02 MED ORDER — ACETAMINOPHEN 10 MG/ML IV SOLN
INTRAVENOUS | Status: DC | PRN
  Administered 2010-12-02: 1000 mg via INTRAVENOUS

## 2010-12-02 MED ORDER — METOPROLOL TARTRATE 1 MG/ML IV SOLN
5.0000 mg | Freq: Four times a day (QID) | INTRAVENOUS | Status: DC | PRN
  Filled 2010-12-02: qty 5

## 2010-12-02 MED ORDER — PSYLLIUM 95 % PO PACK
1.0000 | PACK | Freq: Two times a day (BID) | ORAL | Status: DC
  Administered 2010-12-02 – 2010-12-06 (×8): 1 via ORAL
  Filled 2010-12-02 (×9): qty 1

## 2010-12-02 MED ORDER — LIDOCAINE HCL (CARDIAC) 20 MG/ML IV SOLN
INTRAVENOUS | Status: DC | PRN
  Administered 2010-12-02: 20 mg via INTRAVENOUS

## 2010-12-02 MED ORDER — LACTATED RINGERS IV SOLN
INTRAVENOUS | Status: DC
  Administered 2010-12-02: 12:00:00 via INTRAVENOUS

## 2010-12-02 MED ORDER — HEPARIN SODIUM (PORCINE) 5000 UNIT/ML IJ SOLN
5000.0000 [IU] | Freq: Three times a day (TID) | INTRAMUSCULAR | Status: DC
  Administered 2010-12-03: 5000 [IU] via SUBCUTANEOUS
  Filled 2010-12-02 (×4): qty 1

## 2010-12-02 MED ORDER — HEPARIN SODIUM (PORCINE) 5000 UNIT/ML IJ SOLN
5000.0000 [IU] | Freq: Three times a day (TID) | INTRAMUSCULAR | Status: DC
  Filled 2010-12-02 (×2): qty 1

## 2010-12-02 MED ORDER — ONDANSETRON 4 MG PO TBDP: 4.0000 mg | ORAL_TABLET | Freq: Four times a day (QID) | ORAL | Status: DC | PRN

## 2010-12-02 MED ORDER — HYDROMORPHONE HCL PF 2 MG/ML IJ SOLN
INTRAMUSCULAR | Status: AC
  Filled 2010-12-02: qty 1

## 2010-12-02 SURGICAL SUPPLY — 91 items
APPLIER CLIP 5 13 M/L LIGAMAX5 (MISCELLANEOUS)
APPLIER CLIP ROT 10 11.4 M/L (STAPLE)
APR CLP MED LRG 11.4X10 (STAPLE)
APR CLP MED LRG 5 ANG JAW (MISCELLANEOUS)
BAG URINE DRAINAGE (UROLOGICAL SUPPLIES) IMPLANT
BLADE EXTENDED COATED 6.5IN (ELECTRODE) ×2 IMPLANT
BLADE HEX COATED 2.75 (ELECTRODE) ×2 IMPLANT
BLADE SURG SZ10 CARB STEEL (BLADE) ×2 IMPLANT
CABLE HIGH FREQUENCY MONO STRZ (ELECTRODE) ×2 IMPLANT
CANISTER SUCTION 2500CC (MISCELLANEOUS) ×2 IMPLANT
CATH FOLEY SILVER 30CC 28FR (CATHETERS) IMPLANT
CELLS DAT CNTRL 66122 CELL SVR (MISCELLANEOUS) IMPLANT
CHLORAPREP W/TINT 26ML (MISCELLANEOUS) ×2 IMPLANT
CLIP APPLIE 5 13 M/L LIGAMAX5 (MISCELLANEOUS) IMPLANT
CLIP APPLIE ROT 10 11.4 M/L (STAPLE) IMPLANT
CLOSURE STERI STRIP 1/2 X4 (GAUZE/BANDAGES/DRESSINGS) ×2 IMPLANT
CLOTH BEACON ORANGE TIMEOUT ST (SAFETY) ×2 IMPLANT
COVER MAYO STAND STRL (DRAPES) ×2 IMPLANT
DECANTER SPIKE VIAL GLASS SM (MISCELLANEOUS) ×2 IMPLANT
DRAIN CHANNEL RND F F (WOUND CARE) IMPLANT
DRAPE LAPAROSCOPIC ABDOMINAL (DRAPES) ×2 IMPLANT
DRAPE LG THREE QUARTER DISP (DRAPES) ×2 IMPLANT
DRAPE WARM FLUID 44X44 (DRAPE) ×4 IMPLANT
DRSG TEGADERM 2-3/8X2-3/4 SM (GAUZE/BANDAGES/DRESSINGS) ×6 IMPLANT
DRSG TEGADERM 4X4.75 (GAUZE/BANDAGES/DRESSINGS) ×2 IMPLANT
ELECT REM PT RETURN 9FT ADLT (ELECTROSURGICAL) ×2
ELECTRODE REM PT RTRN 9FT ADLT (ELECTROSURGICAL) ×1 IMPLANT
ENDOLOOP SUT PDS II  0 18 (SUTURE) ×1
ENDOLOOP SUT PDS II 0 18 (SUTURE) ×1 IMPLANT
EVACUATOR SILICONE 100CC (DRAIN) IMPLANT
FILTER SMOKE EVAC LAPAROSHD (FILTER) IMPLANT
GAUZE SPONGE 2X2 8PLY STRL LF (GAUZE/BANDAGES/DRESSINGS) ×1 IMPLANT
GELPOINT ADV PLATFORM (ENDOMECHANICALS)
GLOVE ECLIPSE 8.0 STRL XLNG CF (GLOVE) ×4 IMPLANT
GLOVE INDICATOR 8.0 STRL GRN (GLOVE) ×4 IMPLANT
GOWN STRL NON-REIN LRG LVL3 (GOWN DISPOSABLE) ×2 IMPLANT
GOWN STRL REIN XL XLG (GOWN DISPOSABLE) ×4 IMPLANT
HAND ACTIVATED (MISCELLANEOUS) IMPLANT
KIT BASIN OR (CUSTOM PROCEDURE TRAY) ×2 IMPLANT
LEGGING LITHOTOMY PAIR STRL (DRAPES) ×2 IMPLANT
LIGASURE IMPACT 36 18CM CVD LR (INSTRUMENTS) IMPLANT
NS IRRIG 1000ML POUR BTL (IV SOLUTION) ×4 IMPLANT
PENCIL BUTTON HOLSTER BLD 10FT (ELECTRODE) ×2 IMPLANT
PLATFORM STD W/COL CELL SVR (ENDOMECHANICALS) IMPLANT
RTRCTR WOUND ALEXIS 18CM MED (MISCELLANEOUS)
SCISSORS LAP 5X35 DISP (ENDOMECHANICALS) ×2 IMPLANT
SEALER TISSUE G2 CVD JAW 35 (ENDOMECHANICALS) IMPLANT
SEALER TISSUE G2 CVD JAW 45CM (ENDOMECHANICALS) ×2 IMPLANT
SET IRRIG TUBING LAPAROSCOPIC (IRRIGATION / IRRIGATOR) ×2 IMPLANT
SLEEVE Z-THREAD 5X100MM (TROCAR) ×4 IMPLANT
SPONGE GAUZE 2X2 STER 10/PKG (GAUZE/BANDAGES/DRESSINGS) ×1
SPONGE GAUZE 4X4 12PLY (GAUZE/BANDAGES/DRESSINGS) ×2 IMPLANT
SPONGE LAP 18X18 X RAY DECT (DISPOSABLE) ×4 IMPLANT
STAPLER CIRC ILS CVD 33MM 37CM (STAPLE) ×2 IMPLANT
STAPLER CUT CVD 40MM BLUE (STAPLE) ×2 IMPLANT
STAPLER VISISTAT 35W (STAPLE) ×2 IMPLANT
SUCTION POOLE TIP (SUCTIONS) ×2 IMPLANT
SUT ETHILON 2 0 PS N (SUTURE) ×4 IMPLANT
SUT MNCRL AB 4-0 PS2 18 (SUTURE) IMPLANT
SUT PDS AB 1 CTX 36 (SUTURE) IMPLANT
SUT PDS AB 1 TP1 96 (SUTURE) IMPLANT
SUT PROLENE 0 CT 2 (SUTURE) ×2 IMPLANT
SUT PROLENE 2 0 CT2 30 (SUTURE) ×4 IMPLANT
SUT PROLENE 2 0 KS (SUTURE) IMPLANT
SUT SILK 2 0 (SUTURE) ×2
SUT SILK 2 0 SH CR/8 (SUTURE) ×2 IMPLANT
SUT SILK 2-0 18XBRD TIE 12 (SUTURE) ×1 IMPLANT
SUT SILK 3 0 (SUTURE)
SUT SILK 3 0 SH CR/8 (SUTURE) ×2 IMPLANT
SUT SILK 3-0 18XBRD TIE 12 (SUTURE) IMPLANT
SUT VIC AB 2-0 SH 18 (SUTURE) IMPLANT
SUT VICRYL 2 0 18  UND BR (SUTURE)
SUT VICRYL 2 0 18 UND BR (SUTURE) IMPLANT
SYR 30ML LL (SYRINGE) IMPLANT
SYR BULB IRRIGATION 50ML (SYRINGE) ×2 IMPLANT
SYRINGE IRR TOOMEY STRL 70CC (SYRINGE) IMPLANT
SYS LAPSCP GELPORT 120MM (MISCELLANEOUS) ×2
SYSTEM LAPSCP GELPORT 120MM (MISCELLANEOUS) ×1 IMPLANT
TOWEL OR 17X26 10 PK STRL BLUE (TOWEL DISPOSABLE) ×2 IMPLANT
TRAY FOLEY CATH 14FRSI W/METER (CATHETERS) ×2 IMPLANT
TRAY LAP CHOLE (CUSTOM PROCEDURE TRAY) ×2 IMPLANT
TROCAR XCEL BLADELESS 5X75MML (TROCAR) ×2 IMPLANT
TROCAR Z-THREAD FIOS 11X100 BL (TROCAR) ×2 IMPLANT
TROCAR Z-THREAD FIOS 12X100MM (TROCAR) IMPLANT
TROCAR Z-THREAD FIOS 5X100MM (TROCAR) ×2 IMPLANT
TROCAR Z-THREAD SLEEVE 11X100 (TROCAR) IMPLANT
TUBING FILTER THERMOFLATOR (ELECTROSURGICAL) ×2 IMPLANT
TUNNELER SHEATH ON-Q 16GX12 DP (PAIN MANAGEMENT) ×2 IMPLANT
WATER STERILE IRR 1500ML POUR (IV SOLUTION) ×2 IMPLANT
YANKAUER SUCT BULB TIP 10FT TU (MISCELLANEOUS) ×2 IMPLANT
YANKAUER SUCT BULB TIP NO VENT (SUCTIONS) ×2 IMPLANT

## 2010-12-02 NOTE — Anesthesia Postprocedure Evaluation (Signed)
  Anesthesia Post-op Note  Patient: Natasha Campbell  Procedure(s) Performed:  COLON RESECTION LAPAROSCOPIC - Laparoscopic Low Anterior Resection, Rigid Proctoscopy  Patient Location: PACU  Anesthesia Type: General  Level of Consciousness: awake and alert   Airway and Oxygen Therapy: Patient Spontanous Breathing  Post-op Pain: mild  Post-op Assessment: Post-op Vital signs reviewed, Patient's Cardiovascular Status Stable, Respiratory Function Stable, Patent Airway and No signs of Nausea or vomiting  Post-op Vital Signs: stable  Complications: No apparent anesthesia complications

## 2010-12-02 NOTE — Brief Op Note (Addendum)
12/02/2010  11:00 AM  PATIENT:  Natasha Campbell  60 y.o. 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  open proximal, silk stitch at polyp.  anastomotic rings (blue stitch proximal)  DISPOSITION OF SPECIMEN:  PATHOLOGY  COUNTS:  YES  TOURNIQUET:  * No tourniquets in log *  DICTATION: .Other Dictation: Dictation Number [id number]  PLAN OF CARE: Admit to inpatient   PATIENT DISPOSITION:  PACU - hemodynamically stable.   Delay start of Pharmacological VTE agent (>24hrs) due to surgical blood loss or risk of bleeding:  NO

## 2010-12-02 NOTE — Op Note (Signed)
Natasha Campbell, Natasha Campbell NO.:  [id number]  MEDICAL RECORD NO.:  [id number]  LOCATION:  1529                         FACILITY:  [facility]  PHYSICIAN:  Ardeth Sportsman, MD     DATE OF BIRTH:  02-27-1950  DATE OF PROCEDURE:  12/02/2010 DATE OF DISCHARGE:                              OPERATIVE REPORT   PRIMARY CARE PHYSICIAN:  Theadora Rama, MD.  GASTROENTEROLOGIST:  Vania Rea. Jarold Motto, MD, Clementeen Graham, FACP, FAGA.  OPERATING SURGEON:  Ardeth Sportsman, MD  ASSISTANT:  Ollen Devynn Scheff. Vernell Morgans, M.D.  PREOPERATIVE DIAGNOSIS:  Tubulovillous adenoma with high-grade dysplasia, status post partial polypectomy at rectosigmoid junction.  POSTOPERATIVE DIAGNOSIS:  Tubulovillous adenoma with high-grade dysplasia, status post partial polypectomy at rectosigmoid junction.  PROCEDURES PERFORMED: 1. Rigid proctoscopy. 2. Laparoscopically-assisted low anterior resection with colorectal     stapled anastomosis.  ANESTHESIA: 1. General anesthesia. 2. Local anesthetic and a field block around all port sites. 3. On-Q continuous bupivacaine pump in preperitoneal plane for a     regional block.  DRAINS:  None.  ESTIMATED BLOOD LOSS:  50 mL.  COMPLICATIONS:  None major.  SPECIMENS: 1. Rectosigmoid colon, open end is proximal.  Silk stitches at the     retained polyp site, staple end is distal.  ANASTOMOTIC RINGS:  Blue stitches and proximal ring.  INDICATIONS:  Natasha Campbell is a 60 year old female who noticed rectal bleeding and was found to have a large polyp in the proximal rectum. Dr. Jarold Motto did a partial polypectomy on this, but did not feel it was safe to completely excise.  Pathology showed high-grade dysplasia.  He sent the patient to me for surgical evaluation.  Given the proximal nature of it, I did not think she was a good candidate for TEM since I was worried about peritoneal breach.  The anatomy and physiology of the digestive tract was explained. Pathophysiology of  colorectal polyps and its natural history risk. Possibility of retained cancer within the remaining specimen was discussed.  Options discussed.  Recommendation was made for segmental resection of the rectosigmoid region that had a polyp within it.  Risks such as bleeding, stroke, heart attack, death, anastomotic leak, need for colostomy, reoperation etc. were discussed.  Questions were answered and she agreed to proceed.  OPERATIVE FINDINGS:  She had a retained polyp in the left anterior region at the rectosigmoid junction.  It was not able to be seen by rigid proctoscopy, but was able to be palpated and later confirmed on excision.  There was no evidence of any metastatic disease.  The anastomosis sits at 12 cm from the anal verge.  DESCRIPTION OF PROCEDURE:  Informed consent was confirmed.  The patient underwent general anesthesia without any difficulty.  She had a Foley catheter sterilely placed.  She had sequential compression devices active during the entire case.  She was positioned in lithotomy with arms tucked.  Surgical time-out confirmed our plan.  I began with a digital exam and rigid proctoscopy.  Her prep was poor. I could get up to 20 cm, but I could not definitively locate the polyp for certain.  I got excellent visualization up to 10 cm, but then  she had some moderate angulation of her rectal fold and I could get into the sigmoid colon.  However, it was hard to glean a definite location.  Knowing that the 10 cm distally were well seen and cleared, I decided to proceed with retrosigmoid resection.  Dr. Jarold Motto had noted it was hidden near the superior rectal fold, so I felt like he had good anatomical landmarks.  CT scan did not show any definite localization either, but there was no obvious mass proximal.  I decided to proceed with abdominal exploration.  After re-prepping and draping confirming proper time-out, I placed a #5 mm port in the right upper  paramedian abdomen using optical entry technique.  I did this with the patient in steep reverse Trendelenburg and right side up.  Entry was clean.  I introduced carbon dioxide insufflation.  Camera inspection revealed no injury.  Under direct visualization, I placed 5 mm ports in the right mid abdomen in the right suprapubic region.  I began with abdominal exploration.  I positioned the patient in Trendelenburg positioning and right side down.  I reflected the greater omentum off the left colon and freed some adhesions off and reflected that up over the stomach and liver to get that out of the way.  I hoped to get the small bowel out of the pelvis.  I could see the rectosigmoid region.  There was a little bit of mild hematoma at the rectosigmoid junction in the mesentery posteriorly, but I did not see any tattooing or any other abnormalities.  I proceeded with rectosigmoid dissection. I scored the peritoneum on the sigmoid mesentery on the right side and then followed that all way down to the peritoneal reflection on the right.  I carried that up a little more cephalad as well.  I therefore proceeded with medial to lateral dissection.  I elevated the sigmoid mesentery off the retroperitoneum.  I immediately saw the left ureter and gonadal vessels and left iliac as her retroperitoneum was actually thin and felt the anatomic structure as well.  I freed the rectosigmoid mesentery off the structures using primarily blunt dissection as well as some focused bipolar EnSeal dissection.  I followed that up to the takeoff of the inferior mesenteric artery, which actually was a little more proximal.  I did further dissection and freed the left colon off its retroperitoneal structures including the left obturator's fascia of the left kidney all the way up to the splenic flexure.  I did further dissection and freed off the mesial rectum over the sacral promontory and down off the presacral plane.  I  could see the nervi erigentes coming up in the classic wishbone pattern over the sacrum and kept those posterior.  I did dissection to mobilize the left colon in a lateral to medial fashion.  I was able to free its attachments off the left pelvic brim and free the sigmoid colon off its attachments at the line of Toldt.  I was able to connect with the retroperitoneal dissection pocket and further dissection up to the splenic flexure.  I then turned attention down and continued dissection distally to help free the left rectum peritoneal coverings off as well to get good mobilization.  I again saw and preserved the retroperitoneal structures, especially the ureter, gonadals and iliacs.  I did further dissection on the mesorectum and freed it bluntly all the way down to the coccyx to help get a good stretch on the rectosigmoid.  She had some redundant left  colon and had a good reach at the descending colon easily.  We reached down into the mid pelvis well.  I went ahead and isolated the superior rectal artery coming off the inferior mesenteric artery and ligated that using an EnSeal system with several burns and ligation.  I ligated that stump also with a 0 PDS endo- loop.  I kept the left colic branch intact since it was nice and since the left colon mesentery was long and stretched well.  I marked the area of epiploic appendage at the region of the distal descending colon that would easily reach down the pelvis.  At this point, I decided to place a gel port as a wound protector through a Pfannenstiel incision.  I did feel the rectosigmoid and had excellent mobilization.  It was hard to palpate anything from the serosal side.  I therefore did use my right hand as a rectal examination and my left hand in through the wound protector and felt.  Finally I could feel a 1.5 cm lobulated mass on the left anterior aspect, right at the rectosigmoid junction.  Dr. Carolynne Edouard agreed.  He placed a silk  stitch at that spot.  We went ahead and isolated the mesorectum and transected at the junction between the proximal and mid rectum.  After skeletonizing the mesorectum using bipolar system and stapled off with a contour stapler.  I eviscerated and found a good location of the descending colon that would easily reach down to the rectal stump.  We took the mesentery radially, preserving the left colic artery by taking the superior rectal drainage, although it had been ligated along with some somewhat thickened fattening.  They were, however, suspicious for perhaps a few lymph nodes right at that junction.  I clamped the descending colon proximally and transected at the descending/sigmoid junction using a scalpel.  I had healthy pink mucosa that bled well implying a good blood supply.  I did sizers and was able to place a 33 anvil into this open end and closed with a 0 Prolene pursestring stitch around it.  Next we went and opened up the specimen.  I opened up on the anti mesenteric side, at the rectosigmoid junction.  I could easily palpate the polyp and transect further until I could eviscerate and  visually confirm that there was a polyp.  I had about 5 cm distal margin with good margins.  There was no other abnormalities.  We felt this was consistent with description by Dr. Jarold Motto and the location, and felt that we had gotten the appropriate mass.  Next we scoped back in.  Dr. Carolynne Edouard did rigid proctoscopy, confirmed there were no other polyps in the remaining rectal stump.  He brought a 33 stapler up through the rectal polyp and brought the spike out.  I attached the anvil of the descending colon to the spike of the rectal stump.  We had made sure that the uterus and vagina were freed off the anterior rectal wall and brought the anvil on the stapler.  We held a clamp for 60 seconds.  He fired and held the fire for 30 seconds and released.  He had 2 excellent anastomotic rings.  He  did rigid proctoscopy and confirmed the anastomosis was at 11 cm from the anal verge.  We did insufflation under water and we had a good airtight seal arguing against it with a negative air leak test.  The anastomosis looked healthy and viable.  She did have about  a chicken egg size fibroid on the posterior dome of the uterus, but we decided to leave that alone.  We did return to diagnostic laparoscopy, confirmed hemostasis was excellent on the remaining left colon.  There was some small bowel underneath the left colon mesentery, so we mobilized and freed that off and confirmed that there was nothing entrapped underneath.  The anastomosis rested well without any tension and things looked pink and happy.  I evacuated carbon dioxide and removed the ports.  I placed the On-Q catheters.  I closed the fascia of the Pfannenstiel incision using 0 Vicryl stitch to help reapproximate the rectus muscles to the midline and close the peritoneum.  I closed the anterior rectus fascia transversely using #1 loop PDS.  I closed the skin at the port sites using Monocryl and also the Pfannenstiel incision.  I did leave a few small gaps to allow some Betadine wicks in the Pfannenstiel wound.  I placed sterile dressing.  The patient is being extubated.  She should go to recovery room.  I think she can go to the floor.  I discussed postop care in the office with the patient just prior to surgery.  I have written instructions.  I am about to locate her husband and family and discuss with them as well.     Ardeth Sportsman, MD     SCG/MEDQ  D:  12/02/2010  T:  12/02/2010  Job:  [id number]  cc:   Rolly Pancake Fax: [phone]  Theadora Rama, MD Fax: [phone]

## 2010-12-02 NOTE — Transfer of Care (Signed)
Immediate Anesthesia Transfer of Care Note  Patient: Natasha Campbell  Procedure(s) Performed:  COLON RESECTION LAPAROSCOPIC - Laparoscopic Low Anterior Resection, Rigid Proctoscopy  Patient Location: PACU  Anesthesia Type: General  Level of Consciousness: awake and alert   Airway & Oxygen Therapy: Patient Spontanous Breathing  Post-op Assessment: Report given to PACU RN and Post -op Vital signs reviewed and stable  Post vital signs: Reviewed and stable  Complications: No apparent anesthesia complications

## 2010-12-02 NOTE — H&P (Signed)
Patient ID: Natasha Campbell, female DOB: May 28, 1950, 60 y.o. MRN: [id number]  HPI  Patient Care Team:  Charolett Bumpers as PCP - General (Unknown Physician Specialty)  Sheryn Bison, MD as Consulting Physician (Gastroenterology)  Ardeth Sportsman, MD as Consulting Physician (General Surgery)   This patient is a 60 y.o.female who presents today for surgical evaluation.  Reason for visit: Rectal polyp with high-grade dysplasia in the proximal rectum. Consideration of surgery.   The patient is a pleasant healthy woman who had intermittent rectal bleeding for the past few months. It worsened. She had a normal colonoscopy 3 years ago. She was begun earlier this month. Dr. Jarold Motto found a 4.5 cm mass in the proximal rectum near the superior rectal fold. He was able to reduce some of the piecemeal. Pathology showed tubulovillous adenoma with high-grade dysplasia. He sent the patient to me for surgical evaluation.  Patient walks several miles w/o difficulty. She normally has a bowel movement about every day. No prior abdominal surgeries. She comes today with her husband. There is family history of GI cancer. Her. No history of breast ovarian or other GYN cancers. Otherwise quite healthy and active.     Past Medical History   Diagnosis  Date   .  Family history of malignant neoplasm of gastrointestinal tract    .  Chronic headaches    .  Arthritis    .  Blood in stool     Past Surgical History   Procedure  Date   .  Tonsillectomy and adenoidectomy     History    Social History   .  Marital Status:  Married     Spouse Name:  N/A     Number of Children:  2   .  Years of Education:  N/A    Occupational History   .   [employer]    Social History Main Topics   .  Smoking status:  Never Smoker   .  Smokeless tobacco:  Never Used   .  Alcohol Use:  No   .  Drug Use:  No   .  Sexually Active:  Not on file    Other Topics  Concern   .  Not on file    Social History Narrative   .  No  narrative on file    Family History   Problem  Relation  Age of Onset   .  Colon cancer  Paternal Grandmother    .  Colon cancer  Paternal Grandfather    .  Colon cancer  Maternal Grandmother    .  Heart disease  Mother    .  Heart disease  Father     Current outpatient prescriptions:Aspirin-Acetaminophen-Caffeine (EXCEDRIN PO), Take 1 tablet by mouth as needed. , Disp: , Rfl: ; Calcium Citrate-Vitamin D (CALCIUM CITRATE + PO), Take by mouth daily. Take 1200 mg daily , Disp: , Rfl: ; cholecalciferol (VITAMIN D) 1000 UNITS tablet, Take 1,000 Units by mouth daily. , Disp: , Rfl: ; Coenzyme Q10 (CO Q-10 PO), Take by mouth daily. Take 100 mg daily , Disp: , Rfl:  fish oil-omega-3 fatty acids 1000 MG capsule, Take 1 g by mouth daily. Take 1200 mg daily , Disp: , Rfl: ; imipramine (TOFRANIL) 10 MG tablet, Take 10 mg by mouth at bedtime. , Disp: , Rfl: ; Magnesium 500 MG CAPS, Take by mouth daily. , Disp: , Rfl: ; Multiple Vitamin (MULTIVITAMIN) tablet, Take 1 tablet by mouth daily. ,  Disp: , Rfl: ; NON FORMULARY, Zonisamide 150 mg 1 tablet daily , Disp: , Rfl:  NON FORMULARY, Flurbiprofen 100 1 as needed , Disp: , Rfl: ; rizatriptan (MAXALT) 10 MG tablet, Take 10 mg by mouth as needed. May repeat in 2 hours if needed , Disp: , Rfl: ; Selenium 100 MCG TABS, Take by mouth daily. , Disp: , Rfl: ; vitamin E 100 UNIT capsule, Take 100 Units by mouth daily. , Disp: , Rfl: ; zolpidem (AMBIEN) 10 MG tablet, Take 10 mg by mouth at bedtime as needed. , Disp: , Rfl:  No Known Allergies  Review of Systems  Constitutional: Negative for fever, chills, diaphoresis, appetite change and fatigue.  HENT: Negative for ear pain, sore throat, trouble swallowing, neck pain and ear discharge.  Eyes: Negative for photophobia, discharge and visual disturbance.  Respiratory: Negative for cough, choking, chest tightness and shortness of breath.  Cardiovascular: Negative for chest pain and palpitations.  Gastrointestinal: Positive  for blood in stool. Negative for nausea, vomiting, abdominal pain, diarrhea, constipation, abdominal distention, anal bleeding and rectal pain.  BM q 1-2d  Genitourinary: Negative for dysuria, frequency and difficulty urinating.  Musculoskeletal: Negative for myalgias and gait problem.  Skin: Negative for color change, pallor and rash.  Neurological: Negative for dizziness, speech difficulty, weakness and numbness.  Hematological: Negative for adenopathy.  Psychiatric/Behavioral: Negative for confusion and agitation. The patient is not nervous/anxious.    Objective:   Physical Exam  Constitutional: She is oriented to person, place, and time. She appears well-developed and well-nourished. No distress.  Smiling, relaxed HENT:  Head: Normocephalic.  Mouth/Throat: Oropharynx is clear and moist. No oropharyngeal exudate.  Eyes: Conjunctivae and EOM are normal. Pupils are equal, round, and reactive to light. No scleral icterus.  Neck: Normal range of motion. Neck supple. No tracheal deviation present.  Cardiovascular: Normal rate, regular rhythm and intact distal pulses.  Pulmonary/Chest: Effort normal and breath sounds normal. No respiratory distress. She exhibits no tenderness.  Abdominal: Soft. She exhibits no distension and no mass. There is no tenderness. Hernia confirmed negative in the right inguinal area and confirmed negative in the left inguinal area.  Musculoskeletal: Normal range of motion. She exhibits no tenderness.  Lymphadenopathy:  She has no cervical adenopathy.  Right: No inguinal adenopathy present.  Left: No inguinal adenopathy present.  Neurological: She is alert and oriented to person, place, and time. No cranial nerve deficit. She exhibits normal muscle tone. Coordination normal.  Skin: Skin is warm and dry. No rash noted. She is not diaphoretic. No erythema.  Psychiatric: She has a normal mood and affect. Her behavior is normal. Judgment and thought content normal.     Assessment:    Large proximal rectal polyp with high-grade dysplasia. Need for more definitive removal.   Plan:    Given that I cannot feel on digital exam and is quite proximal, I do not think she would be a good candidate for TEM. I think she would benefit from segmental rectosigmoid resection. The CAT scan does not show any evidence of metastatic disease. I think I see the fullness up in the mid body of the uterus implying that this is quite at the rectosigmoid junction. I worried a peritoneal breech I tried to do it by TEM.  I did discuss doing a low anterior resection. I think should be an excellent candidate for laparoscopic technique. She had numerous questions and her husband as well. I believe I answered to their satisfaction.  The anatomy &  physiology of the digestive tract was discussed. The pathophysiology was discussed. Natural history risks without surgery was discussed. I feel the risks of no intervention will lead to serious problems that outweigh the operative risks; therefore, I recommended a partial colectomy to remove the pathology. Laparoscopic & open techniques were discussed.  Risks such as bleeding, infection, abscess, leak, reoperation, possible ostomy, hernia, heart attack, death, and other risks were discussed. Goals of post-operative recovery were discussed as well. We will work to minimize complications. An educational handout on the pathology was given as well. Questions were answered. The patient expresses understanding & wishes to proceed with surgery.   ATTENDING ADDENDUM:  I personally reviewed patient's record, examined patient, and formulated the following plan: Plan lap segmental resection of rectosigmoid mass.  Post-op recommendations, pathway, concerns, etc were again explained.  She agrees to proceed

## 2010-12-03 LAB — BASIC METABOLIC PANEL
BUN: 22 mg/dL (ref 6–23)
Calcium: 7.9 mg/dL — ABNORMAL LOW (ref 8.4–10.5)
GFR calc Af Amer: 78 mL/min — ABNORMAL LOW (ref >90)
GFR calc non Af Amer: 67 mL/min — ABNORMAL LOW (ref >90)
Glucose, Bld: 151 mg/dL — ABNORMAL HIGH (ref 70–99)
Potassium: 4.3 mEq/L (ref 3.5–5.1)

## 2010-12-03 LAB — CBC
Hemoglobin: 7.7 g/dL — ABNORMAL LOW (ref 12.0–15.0)
MCH: 30 pg (ref 26.0–34.0)
MCHC: 32.9 g/dL (ref 30.0–36.0)
Platelets: 185 10*3/uL (ref 150–400)
RDW: 13.7 % (ref 11.5–15.5)

## 2010-12-03 LAB — PROTIME-INR: INR: 1.23 (ref 0.00–1.49)

## 2010-12-03 LAB — PREPARE RBC (CROSSMATCH)

## 2010-12-03 MED ORDER — LACTATED RINGERS IV BOLUS (SEPSIS)
1000.0000 mL | Freq: Once | INTRAVENOUS | Status: AC
  Administered 2010-12-03: 1000 mL via INTRAVENOUS

## 2010-12-03 MED ORDER — ZONISAMIDE 25 MG PO CAPS
150.0000 mg | ORAL_CAPSULE | Freq: Every day | ORAL | Status: DC
Start: 2010-12-03 — End: 2010-12-06
  Administered 2010-12-03 – 2010-12-05 (×3): 150 mg via ORAL
  Filled 2010-12-03 (×5): qty 2

## 2010-12-03 NOTE — Progress Notes (Addendum)
Repeat hemoglobin is 6.3.  The patient is alert and oriented. Skin is warm and dry. Nailbeds are pale. Conjunctiva are pale.  Blood pressure is about 118/67,  HR 111.  Abdomen is soft and nontender.  Patient and husband described the stools as being brown with no blood in them.  Assessment: Acute blood loss anemia. She is stable.I do not know whether this is secondary to intraoperative blood loss, or whether there has been postoperative bleeding. Historically that this does not sound like an anastomotic bleed.  Plan:  I will check a PT, PTT and transfuse 2 units of packed red blood cells. Heparin has already been discontinued. I discussed this with the patient and her husband. I advised against ambulating due to the risk of syncope. They asked very good questions about blood-borne diseases. They're comfortable with this plan.  Sharif Rendell M 12/03/2010/3:20 PM

## 2010-12-03 NOTE — Progress Notes (Signed)
1 Day Post-Op  Subjective: Patient is awake and alert and states that she feels pretty good. Tolerating clear liquids. She has asked to pass flatus and some liquid stool per rectum. Abdominal pain is under good control. She has angulated to the bathroom. Foley catheter was removed this morning on schedule.  Objective: Vital signs in last 24 hours: Temp:  [97.3 F (36.3 C)-98.8 F (37.1 C)] 98.1 F (36.7 C) (12/01 1345) Pulse Rate:  [90-120] 108  (12/01 1345) Resp:  [16-20] 20  (12/01 1345) BP: (90-126)/(62-84) 116/65 mmHg (12/01 1345) SpO2:  [98 %-100 %] 100 % (12/01 1345) Last BM Date: 12/02/10  Intake/Output from previous day: 11/30 0701 - 12/01 0700 In: 5013 [P.O.:240; I.V.:4273; IV Piggyback:500] Out: 1475 [Urine:1325; Blood:150] Intake/Output this shift: Total I/O In: 1200 [P.O.:1200] Out: 200 [Urine:200]  General appearance: alert Resp: clear to auscultation bilaterally GI: abdomen is generally soft. Slightly distended. Minimally tender. On-Q device in place.  Lab Results:  Results for orders placed during the hospital encounter of 12/02/10 (from the past 24 hour(s))  BASIC METABOLIC PANEL     Status: Abnormal   Collection Time   12/03/10  4:12 AM      Component Value Range   Sodium 134 (*) 135 - 145 (mEq/L)   Potassium 4.3  3.5 - 5.1 (mEq/L)   Chloride 101  96 - 112 (mEq/L)   CO2 25  19 - 32 (mEq/L)   Glucose, Bld 151 (*) 70 - 99 (mg/dL)   BUN 22  6 - 23 (mg/dL)   Creatinine, Ser 1.19  0.50 - 1.10 (mg/dL)   Calcium 7.9 (*) 8.4 - 10.5 (mg/dL)   GFR calc non Af Amer 67 (*) >90 (mL/min)   GFR calc Af Amer 78 (*) >90 (mL/min)  CBC     Status: Abnormal   Collection Time   12/03/10  4:12 AM      Component Value Range   WBC 13.3 (*) 4.0 - 10.5 (K/uL)   RBC 2.57 (*) 3.87 - 5.11 (MIL/uL)   Hemoglobin 7.7 (*) 12.0 - 15.0 (g/dL)   HCT 14.7 (*) 82.9 - 46.0 (%)   MCV 91.1  78.0 - 100.0 (fL)   MCH 30.0  26.0 - 34.0 (pg)   MCHC 32.9  30.0 - 36.0 (g/dL)   RDW 56.2  13.0 -  86.5 (%)   Platelets 185  150 - 400 (K/uL)     Studies/Results: @RISRSLT24 @     . acetaminophen  650 mg Oral QID  . alvimopan  12 mg Oral BID  . Flora-Q  1 capsule Oral Daily  . gentamicin cream  1 application Topical TID  . heparin  5,000 Units Subcutaneous Q8H  . HYDROmorphone      . HYDROmorphone      . imipramine  20 mg Oral QHS  . lip balm  1 application Topical BID  . psyllium  1 packet Oral BID     Assessment/Plan: s/p Procedure(s): COLON RESECTION LAPAROSCOPIC Ileus resolving early. Will cautiously advance diet to full liquids.  Postop anemia. This does not correlate with her physical exam, vital signs, or blood loss in OR. Will repeat hemoglobin stat and repeat labs tomorrow morning. Patient was informed of the low hemoglobin value.  Increase IV to 125 cc per hour until we can reassess her volume status and whether she has lost blood not  Discontinue pharmacologic DVT prophylaxis because of risk of bleeding.       LOS: 1 day  Shamiyah Ngu M 12/03/2010  . .prob

## 2010-12-04 LAB — CBC
HCT: 24.1 % — ABNORMAL LOW (ref 36.0–46.0)
MCV: 85.8 fL (ref 78.0–100.0)
Platelets: 138 10*3/uL — ABNORMAL LOW (ref 150–400)
RBC: 2.81 MIL/uL — ABNORMAL LOW (ref 3.87–5.11)
WBC: 11.7 10*3/uL — ABNORMAL HIGH (ref 4.0–10.5)

## 2010-12-04 LAB — BASIC METABOLIC PANEL
CO2: 27 mEq/L (ref 19–32)
Chloride: 104 mEq/L (ref 96–112)
Creatinine, Ser: 0.78 mg/dL (ref 0.50–1.10)
GFR calc Af Amer: >90 mL/min (ref >90)
Potassium: 3.7 mEq/L (ref 3.5–5.1)

## 2010-12-04 LAB — HEMOGLOBIN AND HEMATOCRIT, BLOOD: Hemoglobin: 8.1 g/dL — ABNORMAL LOW (ref 12.0–15.0)

## 2010-12-04 NOTE — Progress Notes (Signed)
Patient received 2 units of PRBCs . 1st unit was finished at 2030 and the 2nd unit finished at 2355. Time in Epic does reflect the correct time the unit of blood was finished.

## 2010-12-04 NOTE — Progress Notes (Signed)
2 Days Post-Op  Subjective: Feeling better. +flatus and +BM (nonbloody). Tolerating full liquid diet  Objective: Vital signs in last 24 hours: Temp:  [97.6 F (36.4 C)-98.9 F (37.2 C)] 98.6 F (37 C) (12/02 0636) Pulse Rate:  [104-115] 105  (12/02 0636) Resp:  [16-20] 18  (12/02 0636) BP: (102-130)/(63-84) 130/82 mmHg (12/02 0636) SpO2:  [94 %-100 %] 94 % (12/02 0636) Last BM Date: 12/02/10  Intake/Output from previous day: 12/01 0701 - 12/02 0700 In: 3935.8 [P.O.:1440; I.V.:1764.6; Blood:731.3] Out: 1950 [Urine:1950] Intake/Output this shift:    General appearance: alert, cooperative and no distress GI: minimal incisional tenderness, ND, no peritonitis or sign of bleeding, Incisions without infection, wicks from lower incision removed and wound redressed.  Lab Results:   Basename 12/04/10 0140 12/03/10 1405 12/03/10 0412  WBC 11.7* -- 13.3*  HGB 8.2* 6.3* --  HCT 24.1* 18.9* --  PLT 138* -- 185   BMET  Basename 12/04/10 0140 12/03/10 0412  NA 135 134*  K 3.7 4.3  CL 104 101  CO2 27 25  GLUCOSE 113* 151*  BUN 14 22  CREATININE 0.78 0.92  CALCIUM 7.9* 7.9*   PT/INR  Basename 12/03/10 1645  LABPROT 15.8*  INR 1.23   ABG No results found for this basename: PHART:2,PCO2:2,PO2:2,HCO3:2 in the last 72 hours  Studies/Results: No results found.  Anti-infectives: Anti-infectives     Start     Dose/Rate Route Frequency Ordered Stop   12/01/10 2130   ertapenem (INVANZ) 1 g in sodium chloride 0.9 % 50 mL IVPB  Status:  Discontinued        1 g 100 mL/hr over 30 Minutes Intravenous 60 min pre-op 12/01/10 2129 12/02/10 1258          Assessment/Plan: s/p Procedure(s): COLON RESECTION LAPAROSCOPIC Heplock IV, will follow H/H to eval for ongoing blood loss/anemia, otherwise she seems to be doing okay and appropriate response to transfusion, if Hgb stable tommorrow, would restart lovenox.  LOS: 2 days    Lodema Pilot DAVID 12/04/2010

## 2010-12-05 LAB — CBC
Hemoglobin: 7.8 g/dL — ABNORMAL LOW (ref 12.0–15.0)
MCH: 28.6 pg (ref 26.0–34.0)
Platelets: 156 10*3/uL (ref 150–400)
RBC: 2.73 MIL/uL — ABNORMAL LOW (ref 3.87–5.11)
WBC: 8.7 10*3/uL (ref 4.0–10.5)

## 2010-12-05 LAB — TYPE AND SCREEN

## 2010-12-05 LAB — OCCULT BLOOD X 1 CARD TO LAB, STOOL: Fecal Occult Bld: POSITIVE

## 2010-12-05 NOTE — Progress Notes (Signed)
Pt states that she has not taken the Gentamicin cream 0.1% application for approximately 3 - weeks now; does not wish to take the medication while in the hospital. Marcelino Duster, RN

## 2010-12-05 NOTE — Progress Notes (Signed)
3 Days Post-Op  Subjective: Pt tolerating liquids.  Denies shortness of breath.  Denies sig pain.    Objective: Vital signs in last 24 hours: Temp:  [97.2 F (36.2 C)-98.8 F (37.1 C)] 97.5 F (36.4 C) (12/03 1800) Pulse Rate:  [97-107] 97  (12/03 1800) Resp:  [18-20] 20  (12/03 1800) BP: (117-131)/(76-86) 130/86 mmHg (12/03 1800) SpO2:  [92 %-100 %] 97 % (12/03 1800) Last BM Date: 12/03/10  Intake/Output from previous day: 12/02 0701 - 12/03 0700 In: 600 [P.O.:600] Out: 3700 [Urine:3700] Intake/Output this shift:    General appearance: alert and cooperative Resp: clear to auscultation bilaterally GI: soft, non-tender; bowel sounds normal; no masses,  no organomegaly  Lab Results:   [facility] 12/05/10 0442 12/04/10 1045 12/04/10 0140  WBC 8.7 -- 11.7*  HGB 7.8* 8.1* --  HCT 23.7* 23.8* --  PLT 156 -- 138*   BMET  Basename 12/04/10 0140 12/03/10 0412  NA 135 134*  K 3.7 4.3  CL 104 101  CO2 27 25  GLUCOSE 113* 151*  BUN 14 22  CREATININE 0.78 0.92  CALCIUM 7.9* 7.9*   PT/INR  Basename 12/03/10 1645  LABPROT 15.8*  INR 1.23   ABG No results found for this basename: PHART:2,PCO2:2,PO2:2,HCO3:2 in the last 72 hours  Studies/Results: No results found.  Anti-infectives: Anti-infectives     Start     Dose/Rate Route Frequency Ordered Stop   12/01/10 2130   ertapenem [facility]) 1 g in sodium chloride 0.9 % 50 mL IVPB  Status:  Discontinued        1 g 100 mL/hr over 30 Minutes Intravenous 60 min pre-op 12/01/10 2129 12/02/10 1258          Assessment/Plan: s/p Procedure(s): COLON RESECTION LAPAROSCOPIC Advance diet Recheck CBC in AM  LOS: 3 days    Meril Dray 12/05/2010

## 2010-12-05 NOTE — Progress Notes (Signed)
Patient has ambulated in hall with husband this shift. No c/o pain at present.

## 2010-12-05 NOTE — Progress Notes (Signed)
Pt had small stool in measuring hat- possibly small amt of blood in stool but no order for testing- will save stool for now

## 2010-12-05 NOTE — Progress Notes (Deleted)
Pt being d/c to home. Husband and pt have verbalized understanding of all d/c instructions. Pt taken to car via wheelchair. Marcelino Duster, RN

## 2010-12-06 LAB — CBC
HCT: 26.7 % — ABNORMAL LOW (ref 36.0–46.0)
Hemoglobin: 8.8 g/dL — ABNORMAL LOW (ref 12.0–15.0)
MCV: 88.1 fL (ref 78.0–100.0)
RBC: 3.03 MIL/uL — ABNORMAL LOW (ref 3.87–5.11)
WBC: 6.7 10*3/uL (ref 4.0–10.5)

## 2010-12-06 MED ORDER — OXYCODONE HCL 5 MG PO TABS: 5.0000 mg | ORAL_TABLET | ORAL | Status: DC | PRN

## 2010-12-06 NOTE — Progress Notes (Signed)
Patient ID: Natasha Campbell, female   DOB: January 17, 1950, 60 y.o.   MRN: [id number] 4 Days Post-Op   Subjective: Pt tolerating regular diet.    Objective: Vital signs in last 24 hours: Temp:  [97.5 F (36.4 C)-98.3 F (36.8 C)] 98.2 F (36.8 C) (12/04 1005) Pulse Rate:  [90-101] 101  (12/04 1005) Resp:  [18-20] 18  (12/04 1005) BP: (115-134)/(73-87) 115/73 mmHg (12/04 1005) SpO2:  [97 %-100 %] 97 % (12/04 1005) Last BM Date: 12/05/10  Intake/Output from previous day: 12/03 0701 - 12/04 0700 In: 1320 [P.O.:1320] Out: 4850 [Urine:4850] Intake/Output this shift: Total I/O In: 240 [P.O.:240] Out: 400 [Urine:400]  General appearance: alert and cooperative Resp: clear to auscultation bilaterally GI: soft, non-tender; bowel sounds normal; no masses,  no organomegaly  Lab Results:   [facility] 12/06/10 1251 12/05/10 0442  WBC 6.7 8.7  HGB 8.8* 7.8*  HCT 26.7* 23.7*  PLT 189 156   BMET  Basename 12/04/10 0140  NA 135  K 3.7  CL 104  CO2 27  GLUCOSE 113*  BUN 14  CREATININE 0.78  CALCIUM 7.9*   Assessment/Plan: s/p Procedure(s): COLON RESECTION LAPAROSCOPIC HCT stable Home when tolerating diet.   LOS: 4 days    [facility] 12/06/2010

## 2010-12-06 NOTE — Progress Notes (Signed)
Pt d/c to the home. Pt and pt's significant other (spouse) verbalize understanding of all d/c instructions. States they have no additional questions concerning d/c instructions. Pt taken to car via wheelchair. Marcelino Duster, RN

## 2010-12-07 ENCOUNTER — Encounter (HOSPITAL_COMMUNITY): Payer: Self-pay | Admitting: Surgery

## 2010-12-07 NOTE — Discharge Summary (Addendum)
Physician Discharge Summary  Patient ID: DEBBY CLYNE MRN: [id number] DOB/AGE: 10-Nov-1950 60 y.o.  Admit date: 12/02/2010 Discharge date: 12/06/2010  Admission Diagnoses: Rectal polyp with high grade dysplasia  Discharge Diagnoses: T1N0 adenocarcinoma of proximal rectum Active Problems:  Family history of malignant neoplasm of gastrointestinal tract Post-operative anemia     Discharged Condition: good  Hospital Course: Pt had lap- assisted resection by LAR of rectosigmoid colon containing the retained polyp.  She advanced on her diet gradually.  She developed post-operative anemia & was transfused.  Her Hgb stabilized.  She mobilized well.  By the time of D/C, she was walking well, tolerating PO well, had good pain control, etc.  Therefore she was D/C'd home on POD#4  Consults: none  Significant Diagnostic Studies:   Treatments: surgery: Lap LAR / rigid proctoscopy   Discharge Exam: Blood pressure 115/73, pulse 101, temperature 98.2 F (36.8 C), temperature source Oral, resp. rate 18, height 5\' 6"  (1.676 m), weight 134 lb (60.782 kg), SpO2 97.00%.  General: Pt awake/alert/oriented x4 in no major acute distress Eyes: PERRL, normal EOM. Neuro: CN II-XII intact w/o focal sensory/motor deficits. Lymph: No head/neck/groin lymphadenopathy Psych:  No delerium/psychosis/paranoia HEENT: Normocephalic, Mucus membranes moist.  No thrush Neck: Supple, No tracheal deviation Chest: No pain w good excursion CV:  Pulses intact.  Regular rhythm Abdomen: soft, nontender/nondistended.  No incarcerated hernias.  Incisions clean Ext:  SCDs BLE.  No mjr edema.  No cyanosis Skin: No petechiae / purpurae  Disposition: Home or Self Care  Discharge Orders    Future Appointments: Provider: Department: Dept Phone: Center:   12/15/2010 11:30 AM Ardeth Sportsman, MD [facility] [phone] None     Future Orders Please Complete By Expires   Diet - low sodium heart healthy      Increase  activity slowly      Discharge instructions      Comments:   [facility]      [facility], [place] [phone]  OPEN ABDOMINAL SURGERY: POST OP INSTRUCTIONS  Always review your discharge instruction sheet given to you by the facility where your surgery was performed.  IF YOU HAVE DISABILITY OR FAMILY LEAVE FORMS, YOU MUST BRING THEM TO THE OFFICE FOR PROCESSING.  PLEASE DO NOT GIVE THEM TO YOUR DOCTOR.  A prescription for pain medication may be given to you upon discharge.  Take your pain medication as prescribed, if needed.  If narcotic pain medicine is not needed, then you may take acetaminophen (Tylenol) or ibuprofen (Advil) as needed. Take your usually prescribed medications unless otherwise directed. If you need a refill on your pain medication, please contact your pharmacy. They will contact our office to request authorization.  Prescriptions will not be filled after 5pm or on week-ends. You should follow a light diet the first few days after arrival home, such as soup and crackers, pudding, etc.unless your doctor has advised otherwise. A high-fiber, low fat diet can be resumed as tolerated.   Be sure to include lots of fluids daily. Most patients will experience some swelling and bruising on the chest and neck area.  Ice packs will help.  Swelling and bruising can take several days to resolve Most patients will experience some swelling and bruising in the area of the incision. Ice pack will help. Swelling and bruising can take several days to resolve..  It is common to experience some constipation if taking pain medication after surgery.  Increasing fluid intake and taking a stool softener will usually help  or prevent this problem from occurring.  A mild laxative (Milk of Magnesia or Miralax) should be taken according to package directions if there are no bowel movements after 48 hours.  You may have steri-strips (small skin tapes) in place directly over the incision.  These strips should  be left on the skin for 7-10 days.  If your surgeon used skin glue on the incision, you may shower in 24 hours.  The glue will flake off over the next 2-3 weeks.  Any sutures or staples will be removed at the office during your follow-up visit. You may find that a light gauze bandage over your incision may keep your staples from being rubbed or pulled. You may shower and replace the bandage daily. ACTIVITIES:  You may resume regular (light) daily activities beginning the next day-such as daily self-care, walking, climbing stairs-gradually increasing activities as tolerated.  You may have sexual intercourse when it is comfortable.  Refrain from any heavy lifting or straining until approved by your doctor. You may drive when you no longer are taking prescription pain medication, you can comfortably wear a seatbelt, and you can safely maneuver your car and apply brakes Return to Work: To be determined at post op visit. You should see your doctor in the office for a follow-up appointment approximately two weeks after your surgery.  Make sure that you call for this appointment within a day or two after you arrive home to insure a convenient appointment time. OTHER INSTRUCTIONS:  _____________________________________________________________ _____________________________________________________________  WHEN TO CALL YOUR DOCTOR: Fever over 101.0 Inability to urinate Nausea and/or vomiting Extreme swelling or bruising Continued bleeding from incision. Increased pain, redness, or drainage from the incision. Difficulty swallowing or breathing Muscle cramping or spasms. Numbness or tingling in hands or feet or around lips.  The clinic staff is available to answer your questions during regular business hours.  Please don't hesitate to call and ask to speak to one of the nurses if you have concerns.  For further questions, please visit [email or web address]     Driving Restrictions       Comments:   No driving on narcotics, and no driving while unable to swerve or slam on the breaks.   Lifting restrictions      Comments:   No lifting over 20 pounds times 6 weeks.   Call MD for:  temperature >100.4      Call MD for:  persistant nausea and vomiting      Call MD for:  severe uncontrolled pain      Call MD for:  redness, tenderness, or signs of infection (pain, swelling, redness, odor or green/yellow discharge around incision site)      Call MD for:  extreme fatigue      Call MD for:  persistant dizziness or light-headedness        Discharge Medication List as of 12/06/2010  3:52 PM    START taking these medications   Details  !! oxyCODONE (OXY IR/ROXICODONE) 5 MG immediate release tablet Take 1-2 tablets (5-10 mg total) by mouth every 4 (four) hours as needed for pain., Starting 12/02/2010, Until Mon 12/12/10, Print    !! oxyCODONE (OXY IR/ROXICODONE) 5 MG immediate release tablet Take 1-2 tablets (5-10 mg total) by mouth every 4 (four) hours as needed., Starting 12/06/2010, Until Fri 12/16/10, Print     !! - Potential duplicate medications found. Please discuss with provider.    CONTINUE these medications which have NOT CHANGED   Details  Aspirin-Acetaminophen-Caffeine (  EXCEDRIN PO) Take 1 tablet by mouth as needed. HEADACHE, Until Discontinued, Historical Med    Calcium Citrate-Vitamin D (CALCIUM CITRATE + PO) Take 600 mg by mouth 2 (two) times daily. , Until Discontinued, Historical Med    Cholecalciferol (VITAMIN D) 2000 UNITS tablet Take 2,000 Units by mouth daily. , Until Discontinued, Historical Med    Coenzyme Q10 (CO Q-10 PO) Take 100 mg by mouth daily. , Until Discontinued, Historical Med    DM-Phenylephrine-Acetaminophen (ALKA-SELTZER PLUS DAY COLD/FLU) 10-5-325 MG CAPS Take 1 capsule by mouth every 4 (four) hours as needed. COUGH , Until Discontinued, Historical Med    fish oil-omega-3 fatty acids 1000 MG capsule Take 1 g by mouth daily. , Until  Discontinued, Historical Med    gentamicin cream (GARAMYCIN) 0.1 % Apply 1 application topically 3 (three) times daily. , Until Discontinued, Historical Med    ibuprofen (ADVIL,MOTRIN) 200 MG tablet Take 200 mg by mouth every 6 (six) hours as needed.  , Until Discontinued, Historical Med    imipramine (TOFRANIL) 10 MG tablet Take 20 mg by mouth at bedtime. , Until Discontinued, Historical Med    Magnesium 500 MG CAPS Take 1 capsule by mouth daily. , Until Discontinued, Historical Med    Multiple Vitamin (MULTIVITAMIN) tablet Take 1 tablet by mouth daily. , Until Discontinued, Historical Med    !! NON FORMULARY Take 150 mg by mouth at bedtime. Zonisamide 150 mg 1 tablet daily, Until Discontinued, Historical Med    !! NON FORMULARY Take 100 mg by mouth every 8 (eight) hours as needed. Flurbiprofen.  HEADACHE , Until Discontinued, Historical Med    Phenyleph-Doxylamine-DM-APAP (ALKA-SELTZER PLS NIGHT CLD/FLU PO) Take 2 tablets by mouth every 6 (six) hours as needed. COLD, Until Discontinued, Historical Med    rizatriptan (MAXALT) 10 MG tablet Take 10 mg by mouth as needed. May repeat in 2 hours if needed. HEADACHE, Until Discontinued, Historical Med    Selenium 100 MCG TABS Take 1 tablet by mouth 2 (two) times daily. , Until Discontinued, Historical Med    zolpidem (AMBIEN) 10 MG tablet Take 10 mg by mouth at bedtime as needed. SLEEP , Until Discontinued, Historical Med     !! - Potential duplicate medications found. Please discuss with provider.     Follow-up Information    Follow up with Curley Fayette C., MD. Make an appointment in 1 week.   Contact information:   [employer] [address] [place] [address] [phone]          Signed: Ardeth Sportsman. 12/07/2010, 4:22 PM

## 2010-12-08 ENCOUNTER — Telehealth (INDEPENDENT_AMBULATORY_CARE_PROVIDER_SITE_OTHER): Payer: Self-pay

## 2010-12-08 NOTE — Telephone Encounter (Signed)
Called pt with path report and advised her that there was a small cancer in the mass that was removed but no other treatment needed per DR Gross. The pt notified that the cure rate of 95% with surgery. The pt requested a copy of her path report to be mailed to her so I will put copy in the mail today./ [facility]

## 2010-12-15 ENCOUNTER — Ambulatory Visit (INDEPENDENT_AMBULATORY_CARE_PROVIDER_SITE_OTHER): Payer: BC Managed Care – PPO | Admitting: Surgery

## 2010-12-15 ENCOUNTER — Encounter (INDEPENDENT_AMBULATORY_CARE_PROVIDER_SITE_OTHER): Payer: Self-pay | Admitting: Surgery

## 2010-12-15 VITALS — BP 128/84 | HR 88 | Temp 97.6°F | Resp 16 | Ht 66.5 in | Wt 137.2 lb

## 2010-12-15 DIAGNOSIS — C20 Malignant neoplasm of rectum: Secondary | ICD-10-CM

## 2010-12-15 DIAGNOSIS — G4700 Insomnia, unspecified: Secondary | ICD-10-CM | POA: Insufficient documentation

## 2010-12-15 MED ORDER — OXYCODONE HCL 5 MG PO TABS
5.0000 mg | ORAL_TABLET | ORAL | Status: AC | PRN
Start: 2010-12-15 — End: 2010-12-25

## 2010-12-15 NOTE — Progress Notes (Signed)
Subjective:     Patient ID: Natasha Campbell, female   DOB: February 12, 1950, 60 y.o.   MRN: [id number]  HPI  Natasha Campbell  [birth year] [id number]  Patient Care Team: Charolett Bumpers as PCP - General (Unknown Physician Specialty) Sheryn Bison, MD as Consulting Physician (Gastroenterology) Ardeth Sportsman, MD as Consulting Physician (General Surgery)  This patient is a 60 y.o.female who presents today for surgical evaluation.   Procedure: Laparoscopic low anterior resection  Diagnosis: Stage I early cancer in remaining polyp.  The patient comes in today doing pretty well. She is using a few oxycodone at night to help sleep. Trying to maximize her no narcotic pain control.  No fevers or chills or sweats. Bowel movements about every day with occasional loose stools. Energy level slowly recurred returning. She's been hesitant to exercise despite her husband encouragement. She has not shower or bathe and one that was located. She's been doing sponge baths. Overall improving from last week and certainly since discharge.  She her husband felt relieved that we caught in early cancer. She had numerous questions about the pathology and long-term issues et Karie Soda.  Patient Active Problem List  Diagnoses  . Family history of malignant neoplasm of gastrointestinal tract  . Rectal cancer, T1N0 s/p LAR 12/02/2010  . Insomnia    Past Medical History  Diagnosis Date  . Family history of malignant neoplasm of gastrointestinal tract   . Chronic headaches   . Blood in stool   . Arthritis   . Headache     migraines  . Recurrent upper respiratory infection (URI)     cold now started 11/16/2010  . Rectal cancer, T1N0 s/p LAR 12/02/2010 11/01/2010    Past Surgical History  Procedure Date  . Tonsillectomy and adenoidectomy     age 60  . Colon resection 12/02/2010    Procedure: COLON RESECTION LAPAROSCOPIC;  Surgeon: Ardeth Sportsman, MD;  Location: [facility];  Service: General;  Laterality: N/A;   Laparoscopic Low Anterior Resection, Rigid Proctoscopy    History   Social History  . Marital Status: Married    Spouse Name: N/A    Number of Children: 2  . Years of Education: N/A   Occupational History  .  [employer]   Social History Main Topics  . Smoking status: Never Smoker   . Smokeless tobacco: Never Used  . Alcohol Use: No  . Drug Use: No  . Sexually Active: Not on file   Other Topics Concern  . Not on file   Social History Narrative  . No narrative on file    Family History  Problem Relation Age of Onset  . Colon cancer Paternal Grandmother   . Cancer Paternal Grandmother     colon  . Colon cancer Paternal Grandfather   . Cancer Paternal Grandfather     colon  . Colon cancer Maternal Grandmother   . Cancer Maternal Grandmother     colon  . Heart disease Mother   . Heart disease Father     pacemaker    Current outpatient prescriptions:Aspirin-Acetaminophen-Caffeine (EXCEDRIN PO), Take 1 tablet by mouth as needed. HEADACHE, Disp: , Rfl: ;  Calcium Citrate-Vitamin D (CALCIUM CITRATE + PO), Take 600 mg by mouth 2 (two) times daily. , Disp: , Rfl: ;  Cholecalciferol (VITAMIN D) 2000 UNITS tablet, Take 2,000 Units by mouth daily. , Disp: , Rfl: ;  Coenzyme Q10 (CO Q-10 PO), Take 100 mg by mouth daily. , Disp: , Rfl:  fish  oil-omega-3 fatty acids 1000 MG capsule, Take 1 g by mouth daily. , Disp: , Rfl: ;  ibuprofen (ADVIL,MOTRIN) 200 MG tablet, Take 200 mg by mouth every 6 (six) hours as needed.  , Disp: , Rfl: ;  imipramine (TOFRANIL) 10 MG tablet, Take 20 mg by mouth at bedtime. , Disp: , Rfl: ;  Magnesium 500 MG CAPS, Take 1 capsule by mouth daily. , Disp: , Rfl: ;  Multiple Vitamin (MULTIVITAMIN) tablet, Take 1 tablet by mouth daily. , Disp: , Rfl:  NON FORMULARY, Take 150 mg by mouth at bedtime. Zonisamide 150 mg 1 tablet daily, Disp: , Rfl: ;  NON FORMULARY, Take 100 mg by mouth every 8 (eight) hours as needed. Flurbiprofen.  HEADACHE , Disp: , Rfl: ;   Phenyleph-Doxylamine-DM-APAP (ALKA-SELTZER PLS NIGHT CLD/FLU PO), Take 2 tablets by mouth every 6 (six) hours as needed. COLD, Disp: , Rfl:  rizatriptan (MAXALT) 10 MG tablet, Take 10 mg by mouth as needed. May repeat in 2 hours if needed. HEADACHE, Disp: , Rfl: ;  Selenium 100 MCG TABS, Take 1 tablet by mouth 2 (two) times daily. , Disp: , Rfl: ;  zolpidem (AMBIEN) 10 MG tablet, Take 10 mg by mouth at bedtime as needed. SLEEP , Disp: , Rfl: ;  zonisamide (ZONEGRAN) 50 MG capsule, , Disp: , Rfl:  oxyCODONE (OXY IR/ROXICODONE) 5 MG immediate release tablet, Take 1-2 tablets (5-10 mg total) by mouth every 4 (four) hours as needed for pain., Disp: 50 tablet, Rfl: 0  No Known Allergies  BP 128/84  Pulse 88  Temp(Src) 97.6 F (36.4 C) (Temporal)  Resp 16  Ht 5' 6.5" (1.689 m)  Wt 137 lb 3.2 oz (62.234 kg)  BMI 21.81 kg/m2     Review of Systems  Constitutional: Negative for fever, chills and diaphoresis.  HENT: Negative for ear pain, sore throat and trouble swallowing.   Eyes: Negative for photophobia and visual disturbance.  Respiratory: Negative for cough and choking.   Cardiovascular: Negative for chest pain, palpitations and leg swelling.  Gastrointestinal: Negative for nausea, vomiting, abdominal pain, diarrhea, blood in stool, abdominal distention, anal bleeding and rectal pain.  Genitourinary: Negative for dysuria, frequency and difficulty urinating.  Musculoskeletal: Negative for myalgias, arthralgias and gait problem.  Skin: Negative for color change, pallor and rash.  Neurological: Negative for dizziness, speech difficulty, weakness and numbness.  Hematological: Negative for adenopathy.  Psychiatric/Behavioral: Negative for confusion and agitation. The patient is not nervous/anxious.        Objective:   Physical Exam  Constitutional: She is oriented to person, place, and time. She appears well-developed and well-nourished. No distress.  HENT:  Head: Normocephalic.    Mouth/Throat: Oropharynx is clear and moist. No oropharyngeal exudate.  Eyes: Conjunctivae and EOM are normal. Pupils are equal, round, and reactive to light. No scleral icterus.  Neck: Normal range of motion. No tracheal deviation present.  Cardiovascular: Normal rate and intact distal pulses.   Pulmonary/Chest: Effort normal. No respiratory distress. She exhibits no tenderness.  Abdominal: Soft. She exhibits no distension and no mass. There is no tenderness. There is no rebound and no guarding. Hernia confirmed negative in the right inguinal area and confirmed negative in the left inguinal area.       Pt had bandages on since leaving hospital - I removed them.  Incisions clean with normal healing ridges.  No hernias  Genitourinary: No vaginal discharge found.  Musculoskeletal: Normal range of motion. She exhibits no tenderness.  Lymphadenopathy:  Right: No inguinal adenopathy present.       Left: No inguinal adenopathy present.  Neurological: She is alert and oriented to person, place, and time. No cranial nerve deficit. She exhibits normal muscle tone. Coordination normal.  Skin: Skin is warm and dry. No rash noted. She is not diaphoretic.  Psychiatric: She has a normal mood and affect. Her behavior is normal.       Assessment:     2 weeks s/p lap LAR for early rectal cancer, recovering well    Plan:     Increase activity as tolerated.  Do not push through pain.  I encouraged her to exercise more regularly. Walk 60 minutes a day. Her husband agreed.  Increase nonnarcotic pain control. Again gave a handout discussing this  Advanced on diet as tolerated. Bowel regimen to avoid problems.  Okay to travel over holidays. Again, I discussed reasons to call or other issues. My hope is she's getting out of the woods as far as any serious complications. However, it is only 2 weeks.  She will need any followup colonoscopy in December 2013. [facility] and [facility] will help ensure  that.  Continue bowel regimen with high fiber diet.  I went over the pathology in detail. CT scan is negative. Cure rate is very high with surgery only. She & her husband seemed reassured.  Return to clinic 3weeks. The patient expressed understanding and appreciation

## 2010-12-15 NOTE — Patient Instructions (Signed)

## 2011-01-02 ENCOUNTER — Telehealth (INDEPENDENT_AMBULATORY_CARE_PROVIDER_SITE_OTHER): Payer: Self-pay

## 2011-01-02 NOTE — Telephone Encounter (Signed)
C/o of frequent BM's- as many as 20 per day- thumb size- regular diet- metamucil at bedtime- per Dr. Magnus Ivan get  C.Diff test. - Rx faxed to [phone] [facility]. - Patient out of town.

## 2011-01-05 ENCOUNTER — Telehealth (INDEPENDENT_AMBULATORY_CARE_PROVIDER_SITE_OTHER): Payer: Self-pay | Admitting: General Surgery

## 2011-01-05 NOTE — Telephone Encounter (Signed)
Called patient to follow up on her phone call from Monday. Patient was sent for a C-diff test out of town. Patient went to the urgent care and was evaluated. Since her bowel movements were all hard they did not think she needed the C-diff test. She states this is getting better. Patient does tend to have about 3 very small bowel movements after every meal, but these are formed. Patient has no other symptoms. No fever, chills, diarrhea. Patient will keep an eye on this and call if needed. She has a follow up appt in a week and will keep appt.

## 2011-01-09 NOTE — Telephone Encounter (Signed)
Pt better - refused C.Diff test.  BMs more normal. Due for close f/u

## 2011-01-12 ENCOUNTER — Encounter (INDEPENDENT_AMBULATORY_CARE_PROVIDER_SITE_OTHER): Payer: Self-pay | Admitting: Surgery

## 2011-01-12 ENCOUNTER — Ambulatory Visit (INDEPENDENT_AMBULATORY_CARE_PROVIDER_SITE_OTHER): Payer: BC Managed Care – PPO | Admitting: Surgery

## 2011-01-12 VITALS — BP 108/62 | HR 70 | Resp 16 | Ht 66.0 in | Wt 135.0 lb

## 2011-01-12 DIAGNOSIS — D649 Anemia, unspecified: Secondary | ICD-10-CM

## 2011-01-12 DIAGNOSIS — C20 Malignant neoplasm of rectum: Secondary | ICD-10-CM

## 2011-01-12 LAB — BASIC METABOLIC PANEL
Chloride: 103 mEq/L (ref 96–112)
Creat: 0.99 mg/dL (ref 0.50–1.10)
Potassium: 4.3 mEq/L (ref 3.5–5.3)

## 2011-01-12 LAB — CBC WITH DIFFERENTIAL/PLATELET
Basophils Relative: 1 % (ref 0–1)
Hemoglobin: 12.3 g/dL (ref 12.0–15.0)
Lymphs Abs: 1.4 10*3/uL (ref 0.7–4.0)
Monocytes Relative: 6 % (ref 3–12)
Neutro Abs: 2.9 10*3/uL (ref 1.7–7.7)
Neutrophils Relative %: 61 % (ref 43–77)
RBC: 4.27 MIL/uL (ref 3.87–5.11)

## 2011-01-12 LAB — ALBUMIN: Albumin: 4.6 g/dL (ref 3.5–5.2)

## 2011-01-12 NOTE — Progress Notes (Signed)
Subjective:     Patient ID: Natasha Campbell, female   DOB: Jan 04, 1950, 61 y.o.   MRN: [id number]  HPI   Natasha Campbell  14-Feb-1950 [id number]  Patient Care Team: Charolett Bumpers as PCP - General (Unknown Physician Specialty) Sheryn Bison, MD as Consulting Physician (Gastroenterology) Ardeth Sportsman, MD as Consulting Physician (General Surgery)  This patient is a 61 y.o.female who presents today for surgical evaluation.   Procedure: Laparoscopic low anterior resection 12/02/2010  Diagnosis: Stage I early cancer in remaining polyp.  The patient comes in today doing OK but is frustrated that she is not normal.  Off all narcotics.   Using PRN motrin, 4-6 pills a day.  No fevers or chills or sweats.   Bowel movements are numerous small pellets with some rectal fullness.  Called w concerns, declined Cdiff test & went to UC.  They said she was fine (we have bnot been successful in getting their note...yet).  BMs better, not loose but still not normal. Energy level slowly returning. She is still not walking/exercising despite her husband encouragement.   Overall improving from last week and certainly since discharge, but not where she wants to be.   She again has numerous questions.  She is afraid to go back to work yet  Patient Active Problem List  Diagnoses  . Family history of malignant neoplasm of gastrointestinal tract  . Rectal cancer, T1N0 s/p LAR 12/02/2010  . Insomnia  . Postoperative anemia    Past Medical History  Diagnosis Date  . Family history of malignant neoplasm of gastrointestinal tract   . Chronic headaches   . Blood in stool   . Arthritis   . Headache     migraines  . Recurrent upper respiratory infection (URI)     cold now started 11/16/2010  . Rectal cancer, T1N0 s/p LAR 12/02/2010 11/01/2010    Past Surgical History  Procedure Date  . Tonsillectomy and adenoidectomy     age 30  . Colon resection 12/02/2010    Procedure: COLON RESECTION  LAPAROSCOPIC;  Surgeon: Ardeth Sportsman, MD;  Location: [facility];  Service: General;  Laterality: N/A;  Laparoscopic Low Anterior Resection, Rigid Proctoscopy    History   Social History  . Marital Status: Married    Spouse Name: N/A    Number of Children: 2  . Years of Education: N/A   Occupational History  .  [employer]   Social History Main Topics  . Smoking status: Never Smoker   . Smokeless tobacco: Never Used  . Alcohol Use: No  . Drug Use: No  . Sexually Active: Not on file   Other Topics Concern  . Not on file   Social History Narrative  . No narrative on file    Family History  Problem Relation Age of Onset  . Colon cancer Paternal Grandmother   . Cancer Paternal Grandmother     colon  . Colon cancer Paternal Grandfather   . Cancer Paternal Grandfather     colon  . Colon cancer Maternal Grandmother   . Cancer Maternal Grandmother     colon  . Heart disease Mother   . Heart disease Father     pacemaker    Current outpatient prescriptions:Aspirin-Acetaminophen-Caffeine (EXCEDRIN PO), Take 1 tablet by mouth as needed. HEADACHE, Disp: , Rfl: ;  Calcium Citrate-Vitamin D (CALCIUM CITRATE + PO), Take 600 mg by mouth 2 (two) times daily. , Disp: , Rfl: ;  Cholecalciferol (VITAMIN D) 2000 UNITS  tablet, Take 2,000 Units by mouth daily. , Disp: , Rfl: ;  Coenzyme Q10 (CO Q-10 PO), Take 100 mg by mouth daily. , Disp: , Rfl:  fish oil-omega-3 fatty acids 1000 MG capsule, Take 1 g by mouth daily. , Disp: , Rfl: ;  ibuprofen (ADVIL,MOTRIN) 200 MG tablet, Take 200 mg by mouth every 6 (six) hours as needed.  , Disp: , Rfl: ;  imipramine (TOFRANIL) 10 MG tablet, Take 20 mg by mouth at bedtime. , Disp: , Rfl: ;  Magnesium 500 MG CAPS, Take 1 capsule by mouth daily. , Disp: , Rfl: ;  Multiple Vitamin (MULTIVITAMIN) tablet, Take 1 tablet by mouth daily. , Disp: , Rfl:  NON FORMULARY, Take 150 mg by mouth at bedtime. Zonisamide 150 mg 1 tablet daily, Disp: , Rfl: ;  NON FORMULARY, Take  100 mg by mouth every 8 (eight) hours as needed. Flurbiprofen.  HEADACHE , Disp: , Rfl: ;  rizatriptan (MAXALT) 10 MG tablet, Take 10 mg by mouth as needed. May repeat in 2 hours if needed. HEADACHE, Disp: , Rfl: ;  Selenium 100 MCG TABS, Take 1 tablet by mouth 2 (two) times daily. , Disp: , Rfl:  zolpidem (AMBIEN) 10 MG tablet, Take 10 mg by mouth at bedtime as needed. SLEEP , Disp: , Rfl: ;  zonisamide (ZONEGRAN) 50 MG capsule, , Disp: , Rfl: ;  Phenyleph-Doxylamine-DM-APAP (ALKA-SELTZER PLS NIGHT CLD/FLU PO), Take 2 tablets by mouth every 6 (six) hours as needed. COLD, Disp: , Rfl:   No Known Allergies  BP 108/62  Pulse 70  Resp 16  Ht 5\' 6"  (1.676 m)  Wt 135 lb (61.236 kg)  BMI 21.79 kg/m2     Review of Systems  Constitutional: Negative for fever, chills and diaphoresis.  HENT: Negative for ear pain, sore throat and trouble swallowing.   Eyes: Negative for photophobia and visual disturbance.  Respiratory: Negative for cough and choking.   Cardiovascular: Negative for chest pain, palpitations and leg swelling.  Gastrointestinal: Negative for nausea, vomiting, abdominal pain, diarrhea, blood in stool, abdominal distention, anal bleeding and rectal pain.  Genitourinary: Negative for dysuria, frequency and difficulty urinating.  Musculoskeletal: Negative for myalgias, arthralgias and gait problem.  Skin: Negative for color change, pallor and rash.  Neurological: Negative for dizziness, speech difficulty, weakness and numbness.  Hematological: Negative for adenopathy.  Psychiatric/Behavioral: Negative for confusion and agitation. The patient is not nervous/anxious.        Objective:   Physical Exam  Constitutional: She is oriented to person, place, and time. She appears well-developed and well-nourished. No distress.  HENT:  Head: Normocephalic.  Mouth/Throat: Oropharynx is clear and moist. No oropharyngeal exudate.  Eyes: Conjunctivae and EOM are normal. Pupils are equal, round,  and reactive to light. No scleral icterus.  Neck: Normal range of motion. No tracheal deviation present.  Cardiovascular: Normal rate and intact distal pulses.   Pulmonary/Chest: Effort normal. No respiratory distress. She exhibits no tenderness.  Abdominal: Soft. She exhibits no distension and no mass. There is no tenderness. There is no rebound and no guarding. Hernia confirmed negative in the right inguinal area and confirmed negative in the left inguinal area.       Incisions clean with normal healing ridges.  No hernias  Genitourinary: No vaginal discharge found.  Musculoskeletal: Normal range of motion. She exhibits no tenderness.  Lymphadenopathy:       Right: No inguinal adenopathy present.       Left: No inguinal adenopathy present.  Neurological: She is alert and oriented to person, place, and time. No cranial nerve deficit. She exhibits normal muscle tone. Coordination normal.  Skin: Skin is warm and dry. No rash noted. She is not diaphoretic.  Psychiatric: She has a normal mood and affect. Her behavior is normal.       Assessment:     5 weeks s/p lap LAR for early rectal cancer, recovering OKl    Plan:     Increase activity as tolerated.  Do not push through pain.  I AGAIN encouraged her to exercise more regularly. Walk 60 minutes a day. Her husband agreed.  Bowel regimen to avoid problems.  Inc metamucil up to 6 doses a day (using 2 now) to help bulk up stoos.  I suspect that the EEA anastomosis is swollen & tight, but it was 33mm & should allow normal caliber stools.  Too high up to check by rectal.    She will need any followup colonoscopy in December 2013.  GI and [facility] will help ensure that.  Continue bowel regimen with high fiber diet.  Return to clinic 3weeks. The patient expressed understanding and appreciation.  RTwork next Thu-Fri 1/2 days, then full the following week  Check labs to see if anemia/nutrition numbers better.  CT scan if worse or not  improving.

## 2011-01-12 NOTE — Patient Instructions (Signed)
GENERAL SURGERY: POST OP INSTRUCTIONS  1. DIET: Follow a light bland diet the first 24 hours after arrival home, such as soup, liquids, crackers, etc.  Be sure to include lots of fluids daily.  Avoid fast food or heavy meals as your are more likely to get nauseated.   2. Take your usually prescribed home medications unless otherwise directed. 3. PAIN CONTROL: a. Pain is best controlled by a usual combination of three different methods TOGETHER: i. Ice/Heat ii. Over the counter pain medication iii. Prescription pain medication b. Most patients will experience some swelling and bruising around the incisions.  Ice packs or heating pads (30-60 minutes up to 6 times a day) will help. Use ice for the first few days to help decrease swelling and bruising, then switch to heat to help relax tight/sore spots and speed recovery.  Some people prefer to use ice alone, heat alone, alternating between ice & heat.  Experiment to what works for you.  Swelling and bruising can take several weeks to resolve.   c. It is helpful to take an over-the-counter pain medication regularly for the first few weeks.  Choose one of the following that works best for you: i. Naproxen (Aleve, etc)  Two 220mg tabs twice a day ii. Ibuprofen (Advil, etc) Three 200mg tabs four times a day (every meal & bedtime) iii. Acetaminophen (Tylenol, etc) 500-650mg four times a day (every meal & bedtime) d. A  prescription for pain medication (such as oxycodone, hydrocodone, etc) should be given to you upon discharge.  Take your pain medication as prescribed.  i. If you are having problems/concerns with the prescription medicine (does not control pain, nausea, vomiting, rash, itching, etc), please call us [phone] to see if we need to switch you to a different pain medicine that will work better for you and/or control your side effect better. ii. If you need a refill on your pain medication, please contact your pharmacy.  They will contact our  office to request authorization. Prescriptions will not be filled after 5 pm or on week-ends. 4. Avoid getting constipated.  Between the surgery and the pain medications, it is common to experience some constipation.  Increasing fluid intake and taking a fiber supplement (such as Metamucil, Citrucel, FiberCon, MiraLax, etc) 1-2 times a day regularly will usually help prevent this problem from occurring.  A mild laxative (prune juice, Milk of Magnesia, MiraLax, etc) should be taken according to package directions if there are no bowel movements after 48 hours.   5. Wash / shower every day.  You may shower over the dressings as they are waterproof.  Continue to shower over incision(s) after the dressing is off. 6. Remove your waterproof bandages 5 days after surgery.  You may leave the incision open to air.  You may have skin tapes (Steri Strips) covering the incision(s).  Leave them on until one week, then remove.  You may replace a dressing/Band-Aid to cover the incision for comfort if you wish.      7. ACTIVITIES as tolerated:   a. You may resume regular (light) daily activities beginning the next day-such as daily self-care, walking, climbing stairs-gradually increasing activities as tolerated.  If you can walk 30 minutes without difficulty, it is safe to try more intense activity such as jogging, treadmill, bicycling, low-impact aerobics, swimming, etc. b. Save the most intensive and strenuous activity for last such as sit-ups, heavy lifting, contact sports, etc  Refrain from any heavy lifting or straining until you   are off narcotics for pain control.   c. DO NOT PUSH THROUGH PAIN.  Let pain be your guide: If it hurts to do something, don't do it.  Pain is your body warning you to avoid that activity for another week until the pain goes down. d. You may drive when you are no longer taking prescription pain medication, you can comfortably wear a seatbelt, and you can safely maneuver your car and apply  brakes. e. You may have sexual intercourse when it is comfortable.  8. FOLLOW UP in our office a. Please call [facility] at [phone] to set up an appointment to see your surgeon in the office for a follow-up appointment approximately 2-3 weeks after your surgery. b. Make sure that you call for this appointment the day you arrive home to insure a convenient appointment time. 9. IF YOU HAVE DISABILITY OR FAMILY LEAVE FORMS, BRING THEM TO THE OFFICE FOR PROCESSING.  DO NOT GIVE THEM TO YOUR DOCTOR.   WHEN TO CALL US [phone]: 1. Poor pain control 2. Reactions / problems with new medications (rash/itching, nausea, etc)  3. Fever over 101.5 F (38.5 C) 4. Worsening swelling or bruising 5. Continued bleeding from incision. 6. Increased pain, redness, or drainage from the incision   The clinic staff is available to answer your questions during regular business hours (8:30am-5pm).  Please don't hesitate to call and ask to speak to one of our nurses for clinical concerns.   If you have a medical emergency, go to the nearest emergency room or call 911.  A surgeon from [facility] is always on call at the hospitals   [facility] [address], [address], [place], [place]  [address] ? MAIN: [phone] ? TOLL FREE: [phone] ?  FAX [phone] [email or web address]  

## 2011-01-13 LAB — URINALYSIS W MICROSCOPIC + REFLEX CULTURE
Bilirubin Urine: NEGATIVE
Crystals: NONE SEEN
Leukocytes, UA: NEGATIVE
Specific Gravity, Urine: 1.013 (ref 1.005–1.030)
Squamous Epithelial / LPF: NONE SEEN
Urobilinogen, UA: 0.2 mg/dL (ref 0.0–1.0)

## 2011-01-13 LAB — PREALBUMIN: Prealbumin: 32.5 mg/dL (ref 17.0–34.0)

## 2011-01-16 ENCOUNTER — Telehealth (INDEPENDENT_AMBULATORY_CARE_PROVIDER_SITE_OTHER): Payer: Self-pay

## 2011-01-16 NOTE — Telephone Encounter (Signed)
Called pt to notify her of her labs that were drawn last wk of being normal per DR Gross. I asked how the pt was feeling and she is doing much better since she increase her Metamucil. The pt said that her bowels are the best they ever been and she is walking everyday now. The pt feels really good.

## 2011-01-19 ENCOUNTER — Telehealth (INDEPENDENT_AMBULATORY_CARE_PROVIDER_SITE_OTHER): Payer: Self-pay

## 2011-01-19 ENCOUNTER — Encounter (INDEPENDENT_AMBULATORY_CARE_PROVIDER_SITE_OTHER): Payer: Self-pay

## 2011-01-19 NOTE — Telephone Encounter (Signed)
LMOM stating that I did do a new note for work giving her part time hours next wk and return full duty on 01-30-11. I faxed the note attn:Judy Gerre Pebbles [phone].

## 2011-02-06 ENCOUNTER — Ambulatory Visit (INDEPENDENT_AMBULATORY_CARE_PROVIDER_SITE_OTHER): Payer: BC Managed Care – PPO | Admitting: Surgery

## 2011-02-06 ENCOUNTER — Encounter (INDEPENDENT_AMBULATORY_CARE_PROVIDER_SITE_OTHER): Payer: Self-pay | Admitting: Surgery

## 2011-02-06 DIAGNOSIS — R12 Heartburn: Secondary | ICD-10-CM

## 2011-02-06 DIAGNOSIS — C20 Malignant neoplasm of rectum: Secondary | ICD-10-CM

## 2011-02-06 NOTE — Progress Notes (Signed)
Subjective:     Patient ID: Natasha Campbell, female   DOB: March 22, 1950, 61 y.o.   MRN: [id number]  HPI   ANALILIA GEDDIS  [birth year] [id number]  Patient Care Team: Charolett Bumpers as PCP - General (Unknown Physician Specialty) Sheryn Bison, MD as Consulting Physician (Gastroenterology) Ardeth Sportsman, MD as Consulting Physician (General Surgery)  This patient is a 61 y.o.female who presents today for surgical evaluation.   Procedure: Laparoscopic low anterior resection 12/02/2010  Diagnosis: Stage I early cancer in remaining polyp.  The patient comes in today doing much better.  No pain.  No fevers or chills or sweats.  Energy level is coming back.  BMs better in that well-formed & more normal caliber w 6tsp Metamucil a day. But, more flatulent  Mild mid-back soreness - improving.  Mild HB.  She is walking/exercising more with her husband's encouragement.   She & he again have numerous questions.   Patient Active Problem List  Diagnoses  . Family history of malignant neoplasm of gastrointestinal tract  . Rectal cancer, T1N0 s/p LAR 12/02/2010  . Insomnia  . Postoperative anemia  . Heartburn    Past Medical History  Diagnosis Date  . Family history of malignant neoplasm of gastrointestinal tract   . Chronic headaches   . Blood in stool   . Arthritis   . Headache     migraines  . Recurrent upper respiratory infection (URI)     cold now started 11/16/2010  . Rectal cancer, T1N0 s/p LAR 12/02/2010 11/01/2010    Past Surgical History  Procedure Date  . Tonsillectomy and adenoidectomy     age 61  . Colon resection 12/02/2010    Procedure: COLON RESECTION LAPAROSCOPIC;  Surgeon: Ardeth Sportsman, MD;  Location: [facility];  Service: General;  Laterality: N/A;  Laparoscopic Low Anterior Resection, Rigid Proctoscopy    History   Social History  . Marital Status: Married    Spouse Name: N/A    Number of Children: 2  . Years of Education: N/A   Occupational History  .   [employer]   Social History Main Topics  . Smoking status: Never Smoker   . Smokeless tobacco: Never Used  . Alcohol Use: No  . Drug Use: No  . Sexually Active: Not on file   Other Topics Concern  . Not on file   Social History Narrative  . No narrative on file    Family History  Problem Relation Age of Onset  . Colon cancer Paternal Grandmother   . Cancer Paternal Grandmother     colon  . Colon cancer Paternal Grandfather   . Cancer Paternal Grandfather     colon  . Colon cancer Maternal Grandmother   . Cancer Maternal Grandmother     colon  . Heart disease Mother   . Heart disease Father     pacemaker    Current outpatient prescriptions:Aspirin-Acetaminophen-Caffeine (EXCEDRIN PO), Take 1 tablet by mouth as needed. HEADACHE, Disp: , Rfl: ;  Calcium Citrate-Vitamin D (CALCIUM CITRATE + PO), Take 600 mg by mouth 2 (two) times daily. , Disp: , Rfl: ;  Cholecalciferol (VITAMIN D) 2000 UNITS tablet, Take 2,000 Units by mouth daily. , Disp: , Rfl: ;  Coenzyme Q10 (CO Q-10 PO), Take 100 mg by mouth daily. , Disp: , Rfl:  fish oil-omega-3 fatty acids 1000 MG capsule, Take 1 g by mouth daily. , Disp: , Rfl: ;  ibuprofen (ADVIL,MOTRIN) 200 MG tablet, Take 200 mg by  mouth every 6 (six) hours as needed.  , Disp: , Rfl: ;  imipramine (TOFRANIL) 10 MG tablet, Take 20 mg by mouth at bedtime. , Disp: , Rfl: ;  Magnesium 500 MG CAPS, Take 1 capsule by mouth daily. , Disp: , Rfl: ;  Multiple Vitamin (MULTIVITAMIN) tablet, Take 1 tablet by mouth daily. , Disp: , Rfl:  NON FORMULARY, Take 150 mg by mouth at bedtime. Zonisamide 150 mg 1 tablet daily, Disp: , Rfl: ;  NON FORMULARY, Take 100 mg by mouth every 8 (eight) hours as needed. Flurbiprofen.  HEADACHE , Disp: , Rfl: ;  rizatriptan (MAXALT) 10 MG tablet, Take 10 mg by mouth as needed. May repeat in 2 hours if needed. HEADACHE, Disp: , Rfl: ;  Selenium 100 MCG TABS, Take 1 tablet by mouth 2 (two) times daily. , Disp: , Rfl:  zolpidem (AMBIEN) 10 MG  tablet, Take 10 mg by mouth at bedtime as needed. SLEEP , Disp: , Rfl: ;  zonisamide (ZONEGRAN) 50 MG capsule, , Disp: , Rfl:   No Known Allergies  BP 118/82  Pulse 76  Temp(Src) 98 F (36.7 C) (Temporal)  Resp 16  Ht 5\' 6"  (1.676 m)  Wt 137 lb 3.2 oz (62.234 kg)  BMI 22.14 kg/m2     Review of Systems  Constitutional: Negative for fever, chills and diaphoresis.  HENT: Negative for ear pain, sore throat and trouble swallowing.   Eyes: Negative for photophobia and visual disturbance.  Respiratory: Negative for cough and choking.   Cardiovascular: Negative for chest pain, palpitations and leg swelling.  Gastrointestinal: Negative for nausea, vomiting, abdominal pain, diarrhea, blood in stool, abdominal distention, anal bleeding and rectal pain.  Genitourinary: Negative for dysuria, frequency and difficulty urinating.  Musculoskeletal: Negative for myalgias, arthralgias and gait problem.  Skin: Negative for color change, pallor and rash.  Neurological: Negative for dizziness, speech difficulty, weakness and numbness.  Hematological: Negative for adenopathy.  Psychiatric/Behavioral: Negative for confusion and agitation. The patient is not nervous/anxious.        Objective:   Physical Exam  Constitutional: She is oriented to person, place, and time. She appears well-developed and well-nourished. No distress.  HENT:  Head: Normocephalic.  Mouth/Throat: Oropharynx is clear and moist. No oropharyngeal exudate.  Eyes: Conjunctivae and EOM are normal. Pupils are equal, round, and reactive to light. No scleral icterus.  Neck: Normal range of motion. No tracheal deviation present.  Cardiovascular: Normal rate and intact distal pulses.   Pulmonary/Chest: Effort normal. No respiratory distress. She exhibits no tenderness.  Abdominal: Soft. She exhibits no distension and no mass. There is no tenderness. There is no rebound and no guarding. Hernia confirmed negative in the right inguinal  area and confirmed negative in the left inguinal area.       Incisions clean with normal healing ridges.  No hernias  Genitourinary: No vaginal discharge found.  Musculoskeletal: Normal range of motion. She exhibits no tenderness.  Lymphadenopathy:       Right: No inguinal adenopathy present.       Left: No inguinal adenopathy present.  Neurological: She is alert and oriented to person, place, and time. No cranial nerve deficit. She exhibits normal muscle tone. Coordination normal.  Skin: Skin is warm and dry. No rash noted. She is not diaphoretic.  Psychiatric: She has a normal mood and affect. Her behavior is normal.       Assessment:     2 months s/p lap LAR for early rectal cancer, recovering  well    Plan:     I spent 30 minutes re-reviewing pathology, follow-up, endoscopy, diet, bowel regimen, exercise, etc...  Increase activity as tolerated.  Do not push through pain.  Exercise more regularly, 60 minutes a day. Her husband agreed.  Continue bowel regimen to avoid problems.   May change gradually to MiraLax or flax seed if gassiness a problem.  OK to try Align/Activia/Beano to help tolerate fiber better.  She will need any followup colonoscopy in December 2013. [facility] will help ensure that.  Continue high fiber diet.  Return to clinic PRN.  The patient & husband expressed understanding and appreciation.

## 2011-02-06 NOTE — Patient Instructions (Signed)
GETTING TO GOOD BOWEL HEALTH. Irregular bowel habits such as constipation and diarrhea can lead to many problems over time.  Having one soft bowel movement a day is the most important way to prevent further problems.  The anorectal canal is designed to handle stretching and feces to safely manage our ability to get rid of solid waste (feces, poop, stool) out of our body.  BUT, hard constipated stools can act like ripping concrete bricks and diarrhea can be a burning fire to this very sensitive area of our body, causing inflamed hemorrhoids, anal fissures, increasing risk is perirectal abscesses, abdominal pain/bloating, an making irritable bowel worse.     The goal: ONE SOFT BOWEL MOVEMENT A DAY!  To have soft, regular bowel movements:    Drink at least 8 tall glasses of water a day.     Take plenty of fiber.  Fiber is the undigested part of plant food that passes into the colon, acting s "natures broom" to encourage bowel motility and movement.  Fiber can absorb and hold large amounts of water. This results in a larger, bulkier stool, which is soft and easier to pass. Work gradually over several weeks up to 6 servings a day of fiber (25g a day even more if needed) in the form of: o Vegetables -- Root (potatoes, carrots, turnips), leafy green (lettuce, salad greens, celery, spinach), or cooked high residue (cabbage, broccoli, etc) o Fruit -- Fresh (unpeeled skin & pulp), Dried (prunes, apricots, cherries, etc ),  or stewed ( applesauce)  o Whole grain breads, pasta, etc (whole wheat)  o Bran cereals    Bulking Agents -- This type of water-retaining fiber generally is easily obtained each day by one of the following:  o Psyllium bran -- The psyllium plant is remarkable because its ground seeds can retain so much water. This product is available as Metamucil, Konsyl, Effersyllium, Per Diem Fiber, or the less expensive generic preparation in drug and health food stores. Although labeled a laxative, it really  is not a laxative.  o Methylcellulose -- This is another fiber derived from wood which also retains water. It is available as Citrucel. o Polyethylene Glycol - and "artificial" fiber commonly called Miralax or Glycolax.  It is helpful for people with gassy or bloated feelings with regular fiber o Flax Seed - a less gassy fiber than psyllium   No reading or other relaxing activity while on the toilet. If bowel movements take longer than 5 minutes, you are too constipated   AVOID CONSTIPATION.  High fiber and water intake usually takes care of this.  Sometimes a laxative is needed to stimulate more frequent bowel movements, but    Laxatives are not a good long-term solution as it can wear the colon out. o Osmotics (Milk of Magnesia, Fleets phosphosoda, Magnesium citrate, MiraLax, GoLytely) are safer than  o Stimulants (Senokot, Castor Oil, Dulcolax, Ex Lax)    o Do not take laxatives for more than 7days in a row.    IF SEVERELY CONSTIPATED, try a Bowel Retraining Program: o Do not use laxatives.  o Eat a diet high in roughage, such as bran cereals and leafy vegetables.  o Drink six (6) ounces of prune or apricot juice each morning.  o Eat two (2) large servings of stewed fruit each day.  o Take one (1) heaping tablespoon of a psyllium-based bulking agent twice a day. Use sugar-free sweetener when possible to avoid excessive calories.  o Eat a normal breakfast.  o   Set aside 15 minutes after breakfast to sit on the toilet, but do not strain to have a bowel movement.  o If you do not have a bowel movement by the third day, use an enema and repeat the above steps.    Controlling diarrhea o Switch to liquids and simpler foods for a few days to avoid stressing your intestines further. o Avoid dairy products (especially milk & ice cream) for a short time.  The intestines often can lose the ability to digest lactose when stressed. o Avoid foods that cause gassiness or bloating.  Typical foods include  beans and other legumes, cabbage, broccoli, and dairy foods.  Every person has some sensitivity to other foods, so listen to our body and avoid those foods that trigger problems for you. o Adding fiber (Citrucel, Metamucil, psyllium, Miralax) gradually can help thicken stools by absorbing excess fluid and retrain the intestines to act more normally.  Slowly increase the dose over a few weeks.  Too much fiber too soon can backfire and cause cramping & bloating. o Probiotics (such as active yogurt, Align, etc) may help repopulate the intestines and colon with normal bacteria and calm down a sensitive digestive tract.  Most studies show it to be of mild help, though, and such products can be costly. o Medicines:   Bismuth subsalicylate (ex. Kayopectate, Pepto Bismol) every 30 minutes for up to 6 doses can help control diarrhea.  Avoid if pregnant.   Loperamide (Immodium) can slow down diarrhea.  Start with two tablets (4mg  total) first and then try one tablet every 6 hours.  Avoid if you are having fevers or severe pain.  If you are not better or start feeling worse, stop all medicines and call your doctor for advice o Call your doctor if you are getting worse or not better.  Sometimes further testing (cultures, endoscopy, X-ray studies, bloodwork, etc) may be needed to help diagnose and treat the cause of the diarrhea.   Heartburn Heartburn is a painful, burning sensation in the chest. It may feel worse in certain positions, such as lying down or bending over. It is caused by stomach acid backing up into the tube that carries food from the mouth down to the stomach (lower esophagus).  CAUSES   Large meals.   Certain foods and drinks.   Exercise.   Increased acid production.   Being overweight or obese.   Certain medicines.  SYMPTOMS   Burning pain in the chest or lower throat.   Bitter taste in the mouth.   Coughing.  DIAGNOSIS  If the usual treatments for heartburn do not improve your  symptoms, then tests may be done to see if there is another condition present. Possible tests may include:  X-rays.   Endoscopy. This is when a tube with a light and a camera on the end is used to examine the esophagus and the stomach.   A test to measure the amount of acid in the esophagus (pH test).   A test to see if the esophagus is working properly (esophageal manometry).   Blood, breath, or stool tests to check for bacteria that cause ulcers.  TREATMENT   Your caregiver may tell you to use certain over-the-counter medicines (antacids, acid reducers) for mild heartburn.   Your caregiver may prescribe medicines to decrease the acid in your stomach or protect your stomach lining.   Your caregiver may recommend certain diet changes.   For severe cases, your caregiver may recommend that the  head of your bed be elevated on blocks. (Sleeping with more pillows is not an effective treatment as it only changes the position of your head and does not improve the main problem of stomach acid refluxing into the esophagus.)  HOME CARE INSTRUCTIONS   Take all medicines as directed by your caregiver.   Raise the head of your bed by putting blocks under the legs if instructed to by your caregiver.   Do not exercise right after eating.   Avoid eating 2 or 3 hours before bed. Do not lie down right after eating.   Eat small meals throughout the day instead of 3 large meals.   Stop smoking if you smoke.   Maintain a healthy weight.   Identify foods and beverages that make your symptoms worse and avoid them. Foods you may want to avoid include:   Peppers.   Chocolate.   High-fat foods, including fried foods.   Spicy foods.   Garlic and onions.   Citrus fruits, including oranges, grapefruit, lemons, and limes.   Food containing tomatoes or tomato products.   Mint.   Carbonated drinks, caffeinated drinks, and alcohol.   Vinegar.  SEEK IMMEDIATE MEDICAL CARE IF:  You have  severe chest pain that goes down your arm or into your jaw or neck.   You feel sweaty, dizzy, or lightheaded.   You are short of breath.   You vomit blood.   You have difficulty or pain with swallowing.   You have bloody or black, tarry stools.   You have episodes of heartburn more than 3 times a week for more than 2 weeks.  MAKE SURE YOU:  Understand these instructions.   Will watch your condition.   Will get help right away if you are not doing well or get worse.  Document Released: 05/07/2008 Document Revised: 08/31/2010 Document Reviewed: 06/05/2010 [facility] Patient Information 2012 [place], [place].

## 2011-05-09 ENCOUNTER — Telehealth: Payer: Self-pay | Admitting: Gastroenterology

## 2011-05-09 NOTE — Telephone Encounter (Signed)
Vee the RN at [facility] reports pt awoke in the middle of the night with abdominal pain. Elisabeth Cara has a Film/video editor who saw pt. U/A was negative and they have sent a culture in; Vicodin was given for pain. Pt had rectal cancer in November last year and had surgery by Dr Michaell Cowing. She did report to the RN her stools were getting smaller. Pt given an appt with Willette Cluster, NP in am.

## 2011-05-10 ENCOUNTER — Encounter: Payer: Self-pay | Admitting: Nurse Practitioner

## 2011-05-10 ENCOUNTER — Ambulatory Visit (INDEPENDENT_AMBULATORY_CARE_PROVIDER_SITE_OTHER): Payer: BC Managed Care – PPO | Admitting: Nurse Practitioner

## 2011-05-10 VITALS — BP 108/70 | HR 92

## 2011-05-10 DIAGNOSIS — R358 Other polyuria: Secondary | ICD-10-CM

## 2011-05-10 DIAGNOSIS — R102 Pelvic and perineal pain: Secondary | ICD-10-CM

## 2011-05-10 DIAGNOSIS — N949 Unspecified condition associated with female genital organs and menstrual cycle: Secondary | ICD-10-CM

## 2011-05-10 DIAGNOSIS — R3589 Other polyuria: Secondary | ICD-10-CM

## 2011-05-10 DIAGNOSIS — R194 Change in bowel habit: Secondary | ICD-10-CM

## 2011-05-10 DIAGNOSIS — R198 Other specified symptoms and signs involving the digestive system and abdomen: Secondary | ICD-10-CM

## 2011-05-10 DIAGNOSIS — R350 Frequency of micturition: Secondary | ICD-10-CM

## 2011-05-10 MED ORDER — DICYCLOMINE HCL 10 MG PO CAPS: ORAL_CAPSULE | ORAL | Status: DC

## 2011-05-10 NOTE — Progress Notes (Signed)
Natasha Campbell [id number] 22-Sep-1950   HISTORY OR PRESENT ILLNESS : Patient is a 61 year old female known to Dr. Jarold Motto for a family history of colon cancer and a personal history of colon cancer. She had a large tubulovillous adenomatous polyp removed from the rectum by Dr. Jarold Motto in October 2012. In December 2012 she underwent a low anterior resection. Colon pathology was compatible with adenocarcinoma arising in a tubulovillous adenoma, invading into the submucosa. There was no evidence of angiolymphatic invasion. Fifteen lymph nodes were negative, margins were clear. She has felt fine from a GI standpoint since surgery.  Two days ago Natasha Campbell developed sharp pelvic pain. Over the last two days she's had increased frequency of urination. She's also having increased frequency of stool though stools are not loose. She's urinating almost every thirty minutes. No hematuria. No blood in her stool. No fevers. For pain she has been taking Advil every four hours , it helps but the pain is still present to some degree. Patient initially thought she had a urinary tract infection. She saw Dr. Cleda Clarks, her urinalysis was normal, urine culture is pending. Her weight is stable ago.   A few weeks ago patient's dose of imipramine was increased from 20-30 mg. Because of numbness in upper extremities, patient reduced the dose back to 20 mg several days ago. No other recent medication changes.    Current Medications, Allergies, Past Medical History, Past Surgical History, Family History and Social History were reviewed in [employer] record.   PHYSICAL EXAMINATION : General:  Well developed  female in no acute distress Head: Normocephalic and atraumatic Eyes:  sclerae anicteric,conjunctive pink. Ears: Normal auditory acuity Neck: Supple, no masses.  Lungs: Clear throughout to auscultation Heart: Regular rate and rhythm; no murmurs heard Abdomen: Soft, nondistended, nontender. No  masses or hepatomegaly noted. Normal bowel sounds Rectal: Scant amount of light brown stool in vault. No gross abnormalities of the rectum. Musculoskeletal: Symmetrical with no gross deformities  Skin: No lesions on visible extremities Extremities: No edema or deformities noted Neurological: Oriented, grossly nonfocal Cervical Nodes:  No significant cervical adenopathy Psychological:  Alert and cooperative. Normal mood and affect  ASSESSMENT AND PLAN :  1. Two day history of pelvic pain, increased frequency of urination as well as increased frequency of formed bowel movements . Urinalysis is normal. Urine culture is pending.  This is an unusual presentation and it isn't clear what is causing simultaneous urinary and gastrointestinal symptoms. She is 5 months out from a low anterior resection so doubt post-operative problem. Tofranil can cause bowel changes and she did increase her dosage a few weeks ago but that wouldn't explain her pelvic pain. For further evaluation will obtain a pelvic ultrasound. She can try Levsin for possible spasm type pain. She will get urine culture result to me when available. We will call patient with ultrasound results and further recommendations. She may ultimately need GYN and /or urology evaluation.    2. History of colon cancer, s/p low anterior resection December 2012. Colon pathology was compatible with adenocarcinoma arising in a tubulovillous adenoma, invading into the submucosa. There was no evidence of angiolymphatic invasion. Fifteen lymph nodes were negative, margins were clear. She is for follow colonoscopy at one year.

## 2011-05-10 NOTE — Patient Instructions (Signed)
We sent a prescription for Bentyl ( Dicyclomine) to [facility] corner of [address]/ [address]  We scheduled an Ultrasound for Friday 05-12-2011, [facility]   .  Arrive at 1:15. PM Nothing to eat after 7:30 AM. They want you to come with a full bladder.  Drink water slowly after 12:30 PM .

## 2011-05-11 ENCOUNTER — Encounter: Payer: Self-pay | Admitting: Nurse Practitioner

## 2011-05-11 DIAGNOSIS — R194 Change in bowel habit: Secondary | ICD-10-CM | POA: Insufficient documentation

## 2011-05-11 DIAGNOSIS — R102 Pelvic and perineal pain: Secondary | ICD-10-CM | POA: Insufficient documentation

## 2011-05-11 DIAGNOSIS — R3589 Other polyuria: Secondary | ICD-10-CM | POA: Insufficient documentation

## 2011-05-11 DIAGNOSIS — R358 Other polyuria: Secondary | ICD-10-CM | POA: Insufficient documentation

## 2011-05-11 NOTE — Progress Notes (Signed)
Reviewed, I think she ought to taper off her Tofranil .She may be able to get back on it if her symptoms persist after stopping it for several weeks.

## 2011-05-12 ENCOUNTER — Other Ambulatory Visit (HOSPITAL_COMMUNITY): Payer: BC Managed Care – PPO

## 2011-05-15 ENCOUNTER — Other Ambulatory Visit (HOSPITAL_COMMUNITY): Payer: BC Managed Care – PPO

## 2011-05-15 ENCOUNTER — Telehealth: Payer: Self-pay | Admitting: *Deleted

## 2011-05-15 NOTE — Telephone Encounter (Signed)
I looked for the results of the Ultrasound that was to be done on this pt on Fri 05-12-2011.  There were no results so I called and spoke to Tiffany at [facility].  She said the pt called and said she was asymptomatic and she cancelled the Ultrasounds.  I called the pt today and left a message to call us if she was having more problems and made her aware we knew she cancelled the ultasound.

## 2011-05-16 ENCOUNTER — Telehealth: Payer: Self-pay | Admitting: *Deleted

## 2011-05-16 NOTE — Telephone Encounter (Signed)
Per Willette Cluster, NP, call patient and tell her if symptoms return, we will get the ultrasound.

## 2011-05-16 NOTE — Telephone Encounter (Signed)
Spoke with patient and she states the symptoms were totally gone in next morning. Only symptom left was numbness in fingers which she had prior to OV. She will call us back if symptoms reoccur.

## 2011-05-18 ENCOUNTER — Telehealth: Payer: Self-pay | Admitting: *Deleted

## 2011-05-18 NOTE — Telephone Encounter (Signed)
Received a call from [place] , [facility].  She said the pt was originally scheduled for [place] on 05-12-2011. The pt called back and rescheduled it for 05-15-2011.  She did not come for that appt.  I called the pt and left her a message to call Vernona Peake at [facility] [phone] if she wants to cancel the appt  Or she can call [phone] Radiology and reschedule the Pelvic US/Transvaginal US.  I need to know if the pt wants to cancel so I can cancel it and get it off the radiology work que.

## 2011-05-18 NOTE — Telephone Encounter (Signed)
The patient called me back and had called our nurse, Rene Kocher,  on 05-16-2011  To let her know her pain was gone and she was feeling much better and decided not to have the Ultrasound done. I called Alesha at Radiology to advise and she told me the order was still on their work que.  I transferred her to [place] and they spoke. Morrie Sheldon asked Lamona Curl to cancel the orders.  They are now cancelled.

## 2011-09-13 ENCOUNTER — Telehealth: Payer: Self-pay | Admitting: Gastroenterology

## 2011-09-13 NOTE — Telephone Encounter (Signed)
lmom for pt to call back. Per Dr Michaell Cowing' OV note on 01/12/11, pt is to repeat COLON in December 2013.

## 2011-09-14 ENCOUNTER — Encounter: Payer: Self-pay | Admitting: Gastroenterology

## 2011-09-14 NOTE — Telephone Encounter (Signed)
lmom for pt that I changed the Recall date and she can schedule the COLON with me or with the scheduler who answers the phone.

## 2011-09-14 NOTE — Telephone Encounter (Signed)
Pt had Lap rectal resection for Stage 1 rectal cancer on 12/02/10 and in Dr Michaell Cowing' notes on 02/06/11, he wrote pt should have a repeat COLON in 12/2011. Pt wants to know if she can have the COLON in early November before selecting her insurance carrier for next year? Please advise. Thanks.

## 2011-09-14 NOTE — Telephone Encounter (Signed)
So, I can order in early November? Thanks.

## 2011-09-14 NOTE — Telephone Encounter (Signed)
I agree she needs colonoscopy at one and 3 years postop

## 2011-09-14 NOTE — Telephone Encounter (Signed)
i agree colon now

## 2011-09-20 NOTE — Telephone Encounter (Signed)
Pt has scheduled her own appts for pre op and procedure

## 2011-10-10 DIAGNOSIS — G43009 Migraine without aura, not intractable, without status migrainosus: Secondary | ICD-10-CM | POA: Insufficient documentation

## 2011-10-23 ENCOUNTER — Ambulatory Visit (AMBULATORY_SURGERY_CENTER): Payer: BC Managed Care – PPO | Admitting: *Deleted

## 2011-10-23 ENCOUNTER — Encounter: Payer: Self-pay | Admitting: Gastroenterology

## 2011-10-23 VITALS — Ht 66.0 in | Wt 143.2 lb

## 2011-10-23 DIAGNOSIS — Z85038 Personal history of other malignant neoplasm of large intestine: Secondary | ICD-10-CM

## 2011-10-23 MED ORDER — MOVIPREP 100 G PO SOLR: ORAL | Status: DC

## 2011-11-06 ENCOUNTER — Ambulatory Visit (AMBULATORY_SURGERY_CENTER): Payer: BC Managed Care – PPO | Admitting: Gastroenterology

## 2011-11-06 ENCOUNTER — Encounter: Payer: Self-pay | Admitting: Gastroenterology

## 2011-11-06 VITALS — BP 137/74 | HR 79 | Temp 98.2°F | Resp 13 | Ht 66.0 in | Wt 143.0 lb

## 2011-11-06 DIAGNOSIS — Z85038 Personal history of other malignant neoplasm of large intestine: Secondary | ICD-10-CM

## 2011-11-06 DIAGNOSIS — Z800 Family history of malignant neoplasm of digestive organs: Secondary | ICD-10-CM

## 2011-11-06 MED ORDER — SODIUM CHLORIDE 0.9 % IV SOLN: 500.0000 mL | INTRAVENOUS | Status: DC

## 2011-11-06 NOTE — Patient Instructions (Addendum)
YOU HAD AN ENDOSCOPIC PROCEDURE TODAY AT THE [facility]: Refer to the procedure report that was given to you for any specific questions about what was found during the examination.  If the procedure report does not answer your questions, please call your gastroenterologist to clarify.  If you requested that your care partner not be given the details of your procedure findings, then the procedure report has been included in a sealed envelope for you to review at your convenience later.  YOU SHOULD EXPECT: Some feelings of bloating in the abdomen. Passage of more gas than usual.  Walking can help get rid of the air that was put into your GI tract during the procedure and reduce the bloating. If you had a lower endoscopy (such as a colonoscopy or flexible sigmoidoscopy) you may notice spotting of blood in your stool or on the toilet paper. If you underwent a bowel prep for your procedure, then you may not have a normal bowel movement for a few days.  DIET: Your first meal following the procedure should be a light meal and then it is ok to progress to your normal diet.  A half-sandwich or bowl of soup is an example of a good first meal.  Heavy or fried foods are harder to digest and may make you feel nauseous or bloated.  Likewise meals heavy in dairy and vegetables can cause extra gas to form and this can also increase the bloating.  Drink plenty of fluids but you should avoid alcoholic beverages for 24 hours.  ACTIVITY: Your care partner should take you home directly after the procedure.  You should plan to take it easy, moving slowly for the rest of the day.  You can resume normal activity the day after the procedure however you should NOT DRIVE or use heavy machinery for 24 hours (because of the sedation medicines used during the test).    SYMPTOMS TO REPORT IMMEDIATELY: A gastroenterologist can be reached at any hour.  During normal business hours, 8:30 AM to 5:00 PM Monday through Friday,  call [phone].  After hours and on weekends, please call the GI answering service at [phone] who will take a message and have the physician on call contact you.   Following lower endoscopy (colonoscopy or flexible sigmoidoscopy):  Excessive amounts of blood in the stool  Significant tenderness or worsening of abdominal pains  Swelling of the abdomen that is new, acute  Fever of 100F or higher   FOLLOW UP: If any biopsies were taken you will be contacted by phone or by letter within the next 1-3 weeks.  Call your gastroenterologist if you have not heard about the biopsies in 3 weeks.  Our staff will call the home number listed on your records the next business day following your procedure to check on you and address any questions or concerns that you may have at that time regarding the information given to you following your procedure. This is a courtesy call and so if there is no answer at the home number and we have not heard from you through the emergency physician on call, we will assume that you have returned to your regular daily activities without incident.  SIGNATURES/CONFIDENTIALITY: You and/or your care partner have signed paperwork which will be entered into your electronic medical record.  These signatures attest to the fact that that the information above on your After Visit Summary has been reviewed and is understood.  Full responsibility of the confidentiality of   of this discharge information lies with you and/or your care-partner.   Repeat exam in 3 years

## 2011-11-06 NOTE — Progress Notes (Signed)
Patient did not experience any of the following events: a burn prior to discharge; a fall within the facility; wrong site/side/patient/procedure/implant event; or a hospital transfer or hospital admission upon discharge from the facility. (G8907) Patient did not have preoperative order for IV antibiotic SSI prophylaxis. (G8918)  

## 2011-11-06 NOTE — Op Note (Signed)
[facility] [address] [place], [address]   COLONOSCOPY PROCEDURE REPORT  PATIENT: Natasha, Campbell  MR#: [id number] BIRTHDATE: [birth year] , 61  yrs. old GENDER: Female ENDOSCOPIST: Mardella Layman, MD, [facility] REFERRED BY: PROCEDURE DATE:  11/06/2011 PROCEDURE: ASA CLASS:   Class II INDICATIONS:high risk patient with personal history of colon cancer and surgical resection of early rectal cancer 1 year ago.Marland Kitchen MEDICATIONS: propofol (Diprivan) 200mg  IV  DESCRIPTION OF PROCEDURE:   After the risks and benefits and of the procedure were explained, informed consent was obtained.  A digital rectal exam revealed no abnormalities of the rectum.    The LB [id number]  endoscope was introduced through the anus and advanced to the cecum, which was identified by both the appendix and ileocecal valve .  The quality of the prep was adequate, using MoviPrep .  The instrument was then slowly withdrawn as the colon was fully examined.     COLON FINDINGS: A normal appearing cecum, ileocecal valve, and appendiceal orifice were identified.  The ascending, hepatic flexure, transverse, splenic flexure, descending, sigmoid colon and rectum appeared unremarkable.  No polyps or cancers were seen. There was evidence of a prior low anterior surgical anastomosis. Retroflexed views revealed no abnormalities.     The scope was then withdrawn from the patient and the procedure completed.  COMPLICATIONS: There were no complications. ENDOSCOPIC IMPRESSION: 1.   Normal colon...no polyps or cancer 2.   There was evidence of a prior low anterior surgical anastomosis   RECOMMENDATIONS: Repeat Colonoscopy in 3 years.   REPEAT EXAM:  XB:JYNWGN Gross, MD and Theadora Rama, MD  _______________________________ eSigned:  Mardella Layman, MD, [facility] 11/06/2011 9:19 AM

## 2011-11-07 ENCOUNTER — Telehealth: Payer: Self-pay | Admitting: *Deleted

## 2011-11-07 NOTE — Telephone Encounter (Signed)
  Follow up Call-  Call back number 11/06/2011 10/17/2010  Post procedure Call Back phone  # [phone]  Permission to leave phone message Yes -     Patient questions:  Do you have a fever, pain , or abdominal swelling? no Pain Score  0 *  Have you tolerated food without any problems? yes  Have you been able to return to your normal activities? yes  Do you have any questions about your discharge instructions: Diet   no Medications  no Follow up visit  no  Do you have questions or concerns about your Care? no  Actions: * If pain score is 4 or above: No action needed, pain <4.

## 2011-12-04 NOTE — Telephone Encounter (Signed)
Error

## 2012-01-03 DIAGNOSIS — N189 Chronic kidney disease, unspecified: Secondary | ICD-10-CM

## 2012-01-03 HISTORY — DX: Chronic kidney disease, unspecified: N18.9

## 2012-09-26 ENCOUNTER — Other Ambulatory Visit: Payer: Self-pay | Admitting: Nephrology

## 2012-09-26 DIAGNOSIS — N182 Chronic kidney disease, stage 2 (mild): Secondary | ICD-10-CM

## 2012-09-30 ENCOUNTER — Other Ambulatory Visit: Payer: BC Managed Care – PPO

## 2012-09-30 ENCOUNTER — Ambulatory Visit
Admission: RE | Admit: 2012-09-30 | Discharge: 2012-09-30 | Disposition: A | Discharge status: Home / Self Care | Payer: BC Managed Care – PPO | Source: Ambulatory Visit | Attending: Nephrology | Admitting: Nephrology

## 2012-09-30 DIAGNOSIS — N182 Chronic kidney disease, stage 2 (mild): Secondary | ICD-10-CM

## 2012-10-01 ENCOUNTER — Encounter (INDEPENDENT_AMBULATORY_CARE_PROVIDER_SITE_OTHER): Payer: Self-pay

## 2012-11-15 DIAGNOSIS — N189 Chronic kidney disease, unspecified: Secondary | ICD-10-CM | POA: Insufficient documentation

## 2013-01-06 ENCOUNTER — Other Ambulatory Visit (HOSPITAL_COMMUNITY): Payer: Self-pay | Admitting: Family Medicine

## 2013-01-06 ENCOUNTER — Ambulatory Visit
Admission: RE | Admit: 2013-01-06 | Discharge: 2013-01-06 | Disposition: A | Discharge status: Home / Self Care | Payer: BC Managed Care – PPO | Source: Ambulatory Visit | Attending: Family Medicine | Admitting: Family Medicine

## 2013-01-06 DIAGNOSIS — R52 Pain, unspecified: Secondary | ICD-10-CM

## 2013-01-06 DIAGNOSIS — W19XXXA Unspecified fall, initial encounter: Secondary | ICD-10-CM

## 2013-09-18 ENCOUNTER — Other Ambulatory Visit: Payer: Self-pay | Admitting: Obstetrics and Gynecology

## 2013-09-22 LAB — CYTOLOGY - PAP

## 2014-08-25 ENCOUNTER — Encounter: Payer: Self-pay | Admitting: *Deleted

## 2014-08-31 ENCOUNTER — Encounter: Payer: Self-pay | Admitting: Internal Medicine

## 2014-10-30 ENCOUNTER — Ambulatory Visit (AMBULATORY_SURGERY_CENTER): Payer: Self-pay | Admitting: *Deleted

## 2014-10-30 VITALS — Ht 65.0 in | Wt 152.2 lb

## 2014-10-30 DIAGNOSIS — Z85038 Personal history of other malignant neoplasm of large intestine: Secondary | ICD-10-CM

## 2014-10-30 MED ORDER — SUPREP BOWEL PREP KIT 17.5-3.13-1.6 GM/177ML PO SOLN: 1.0000 | Freq: Once | ORAL | Status: DC

## 2014-10-30 NOTE — Progress Notes (Signed)
Patient denies any allergies to egg or soy products. Patient denies complications with anesthesia/sedation.  Patient denies oxygen use at home and denies diet medications. Emmi instructions for colonoscopy explained and patient denied.

## 2014-11-04 ENCOUNTER — Encounter: Payer: Self-pay | Admitting: Gastroenterology

## 2014-11-13 ENCOUNTER — Encounter: Payer: Self-pay | Admitting: Internal Medicine

## 2014-11-23 ENCOUNTER — Encounter: Payer: Self-pay | Admitting: Internal Medicine

## 2014-12-29 NOTE — Addendum Note (Signed)
Addended by: Steva Ready on: 12/29/2014 02:56 PM   Modules accepted: Level of Service

## 2015-01-25 ENCOUNTER — Telehealth: Payer: Self-pay | Admitting: Internal Medicine

## 2015-01-25 NOTE — Telephone Encounter (Signed)
Pt scheduled for colon Wednesday 01-27-2015 arriving at 10 am for an 11 am colon.   Instructed Tuesday clears only, no solid foods. Instructed 1st suprep 6 pm Tuesday and on how to mix and drink . Instructed Wednesday am 600 2nd suprep and clears until; 8 am then it all stops, not even a sip of water after 8 am Wednesday , instructed all meds in by 8 am Wednesday .     Pt verbalized understanding of instructions discussed today   Lelan Pons PV

## 2015-01-27 ENCOUNTER — Ambulatory Visit (AMBULATORY_SURGERY_CENTER): Payer: BLUE CROSS/BLUE SHIELD | Admitting: Internal Medicine

## 2015-01-27 ENCOUNTER — Encounter: Payer: Self-pay | Admitting: Internal Medicine

## 2015-01-27 VITALS — BP 128/68 | HR 67 | Temp 98.9°F | Resp 22 | Ht 65.0 in | Wt 152.0 lb

## 2015-01-27 DIAGNOSIS — D120 Benign neoplasm of cecum: Secondary | ICD-10-CM

## 2015-01-27 DIAGNOSIS — Z85038 Personal history of other malignant neoplasm of large intestine: Secondary | ICD-10-CM | POA: Diagnosis not present

## 2015-01-27 DIAGNOSIS — D125 Benign neoplasm of sigmoid colon: Secondary | ICD-10-CM | POA: Diagnosis not present

## 2015-01-27 MED ORDER — SODIUM CHLORIDE 0.9 % IV SOLN: 500.0000 mL | INTRAVENOUS | Status: DC

## 2015-01-27 NOTE — Patient Instructions (Signed)
YOU HAD AN ENDOSCOPIC PROCEDURE TODAY AT THE [facility]:   Refer to the procedure report that was given to you for any specific questions about what was found during the examination.  If the procedure report does not answer your questions, please call your gastroenterologist to clarify.  If you requested that your care partner not be given the details of your procedure findings, then the procedure report has been included in a sealed envelope for you to review at your convenience later.  YOU SHOULD EXPECT: Some feelings of bloating in the abdomen. Passage of more gas than usual.  Walking can help get rid of the air that was put into your GI tract during the procedure and reduce the bloating. If you had a lower endoscopy (such as a colonoscopy or flexible sigmoidoscopy) you may notice spotting of blood in your stool or on the toilet paper. If you underwent a bowel prep for your procedure, you may not have a normal bowel movement for a few days.  Please Note:  You might notice some irritation and congestion in your nose or some drainage.  This is from the oxygen used during your procedure.  There is no need for concern and it should clear up in a day or so.  SYMPTOMS TO REPORT IMMEDIATELY:   Following lower endoscopy (colonoscopy or flexible sigmoidoscopy):  Excessive amounts of blood in the stool  Significant tenderness or worsening of abdominal pains  Swelling of the abdomen that is new, acute  Fever of 100F or higher   For urgent or emergent issues, a gastroenterologist can be reached at any hour by calling [phone].   DIET: Your first meal following the procedure should be a small meal and then it is ok to progress to your normal diet. Heavy or fried foods are harder to digest and may make you feel nauseous or bloated.  Likewise, meals heavy in dairy and vegetables can increase bloating.  Drink plenty of fluids but you should avoid alcoholic beverages for 24  hours.  ACTIVITY:  You should plan to take it easy for the rest of today and you should NOT DRIVE or use heavy machinery until tomorrow (because of the sedation medicines used during the test).    FOLLOW UP: Our staff will call the number listed on your records the next business day following your procedure to check on you and address any questions or concerns that you may have regarding the information given to you following your procedure. If we do not reach you, we will leave a message.  However, if you are feeling well and you are not experiencing any problems, there is no need to return our call.  We will assume that you have returned to your regular daily activities without incident.  If any biopsies were taken you will be contacted by phone or by letter within the next 1-3 weeks.  Please call us at [phone] if you have not heard about the biopsies in 3 weeks.    SIGNATURES/CONFIDENTIALITY: You and/or your care partner have signed paperwork which will be entered into your electronic medical record.  These signatures attest to the fact that that the information above on your After Visit Summary has been reviewed and is understood.  Full responsibility of the confidentiality of this discharge information lies with you and/or your care-partner.  Polyp information given.  

## 2015-01-27 NOTE — Op Note (Signed)
[facility]  [address] [place], [address]   COLONOSCOPY PROCEDURE REPORT  PATIENT: Natasha Campbell, Natasha Campbell  MR#: [id number] BIRTHDATE: [birth year] , 54  yrs. old GENDER: female ENDOSCOPIST: Jerene Bears, MD REFERRED [id number] R Patterson, M.D. PROCEDURE DATE:  01/27/2015 PROCEDURE:   Colonoscopy, surveillance and Colonoscopy with snare polypectomy First Screening Colonoscopy - Avg.  risk and is 50 yrs.  old or older - No.  Prior Negative Screening - Now for repeat screening. N/A  History of Adenoma - Now for follow-up colonoscopy & has been > or = to 3 yrs.  Yes hx of adenoma.  Has been 3 or more years since last colonoscopy.  Polyps removed today? Yes ASA CLASS:   Class III INDICATIONS:Surveillance due to prior colonic neoplasia and PH Rectal Adenocarcinoma s/p LAR, last colon 2013. MEDICATIONS: Monitored anesthesia care, Propofol 400 mg IV, and Lidocaine 40 mg IV  DESCRIPTION OF PROCEDURE:   After the risks benefits and alternatives of the procedure were thoroughly explained, informed consent was obtained.  The digital rectal exam revealed no rectal mass.   The LB [id number]  endoscope was introduced through the anus and advanced to the cecum, which was identified by both the appendix and ileocecal valve. No adverse events experienced. The quality of the prep was excellent.  (Suprep was used)  The instrument was then slowly withdrawn as the colon was fully examined. Estimated blood loss is zero unless otherwise noted in this procedure report.  COLON FINDINGS: Two sessile polyps ranging from 2 to 82mm in size were found at the cecum.  Polypectomies were performed with a cold snare.  The resection was complete, the polyp tissue was completely retrieved and sent to histology.   A sessile polyp measuring 4 mm in size was found in the sigmoid colon.  A polypectomy was performed with a cold snare.  The resection was complete, the polyp tissue was completely  retrieved and sent to histology.   There was evidence of a normal appearing prior surgical anastomosis in the rectosigmoid colon.  Retroflexed views revealed no abnormalities. The time to cecum = 1.6 Withdrawal time = 11.6   The scope was withdrawn and the procedure completed. COMPLICATIONS: There were no immediate complications.  ENDOSCOPIC IMPRESSION: 1.   Two sessile polyps ranging from 2 to 9mm in size were found at the cecum; polypectomies were performed with a cold snare 2.   Sessile polyp was found in the sigmoid colon; polypectomy was performed with a cold snare 3.   There was evidence of prior surgical anastomosis in the rectosigmoid colon  RECOMMENDATIONS: 1.  Await pathology results 2.  Timing of repeat colonoscopy will be determined by pathology findings. 3.  You will receive a letter within 1-2 weeks with the results of your biopsy as well as final recommendations.  Please call my office if you have not received a letter after 3 weeks.  eSigned:  Jerene Bears, MD 01/27/2015 11:26 AM   cc:  the patient, Dr. Leola Brazil

## 2015-01-27 NOTE — Progress Notes (Signed)
Stable to RR 

## 2015-01-27 NOTE — Progress Notes (Signed)
Called to room to assist during endoscopic procedure.  Patient ID and intended procedure confirmed with present staff. Received instructions for my participation in the procedure from the performing physician.  

## 2015-01-28 ENCOUNTER — Telehealth: Payer: Self-pay

## 2015-01-28 NOTE — Telephone Encounter (Signed)
  Follow up Call-  Call back number 01/27/2015  Post procedure Call Back phone  # [phone]  Permission to leave phone message Yes     Patient questions:  Do you have a fever, pain , or abdominal swelling? No. Pain Score  0 *  Have you tolerated food without any problems? Yes.    Have you been able to return to your normal activities? Yes.    Do you have any questions about your discharge instructions: Diet   No. Medications  No. Follow up visit  No.  Do you have questions or concerns about your Care? No.  Actions: * If pain score is 4 or above: No action needed, pain <4.  No problems per the pt. maw

## 2015-02-03 ENCOUNTER — Encounter: Payer: Self-pay | Admitting: Internal Medicine

## 2015-12-30 ENCOUNTER — Telehealth: Payer: Self-pay | Admitting: Internal Medicine

## 2015-12-30 NOTE — Telephone Encounter (Signed)
Patient with rectal bleeding and a history of colon cancer.  She will come in and see Nicoletta Ba PA on 01/07/16 10:00

## 2015-12-30 NOTE — Telephone Encounter (Signed)
Rectal bleeding has been intermittent for the last few weeks

## 2016-01-07 ENCOUNTER — Ambulatory Visit (INDEPENDENT_AMBULATORY_CARE_PROVIDER_SITE_OTHER): Payer: BLUE CROSS/BLUE SHIELD | Admitting: Physician Assistant

## 2016-01-07 ENCOUNTER — Encounter (INDEPENDENT_AMBULATORY_CARE_PROVIDER_SITE_OTHER): Payer: Self-pay

## 2016-01-07 ENCOUNTER — Encounter: Payer: Self-pay | Admitting: Physician Assistant

## 2016-01-07 VITALS — BP 120/70 | HR 76 | Ht 65.25 in | Wt 146.4 lb

## 2016-01-07 DIAGNOSIS — K625 Hemorrhage of anus and rectum: Secondary | ICD-10-CM | POA: Diagnosis not present

## 2016-01-07 DIAGNOSIS — Z85048 Personal history of other malignant neoplasm of rectum, rectosigmoid junction, and anus: Secondary | ICD-10-CM

## 2016-01-07 MED ORDER — NA SULFATE-K SULFATE-MG SULF 17.5-3.13-1.6 GM/177ML PO SOLN: 1.0000 | Freq: Once | ORAL | 0 refills | Status: AC

## 2016-01-07 NOTE — Patient Instructions (Addendum)
You have been scheduled for a colonoscopy. Please follow written instructions given to you at your visit today.  Please pick up your prep supplies at the pharmacy within the next 1-3 days. [facility] [address]  If you use inhalers (even only as needed), please bring them with you on the day of your procedure. Your physician has requested that you go to www.startemmi.com and enter the access code given to you at your visit today. This web site gives a general overview about your procedure. However, you should still follow specific instructions given to you by our office regarding your preparation for the procedure. 

## 2016-01-07 NOTE — Progress Notes (Addendum)
Subjective:    Patient ID: Natasha Campbell, female    DOB: [birth year], 66 y.o.   MRN: [id number]  HPI  Natasha Campbell is a pleasant 66 year old white female, known to Dr. Hilarie Campbell, previous patient of Dr. Sharlett Campbell with diagnosis of rectal cancer, arising from a large flat villous adenomatous polyp in 2012. She had a T1 N0 lesion and underwent low anterior resection. She has had serial follow-up. Her last colonoscopy was done in January 2017 per Dr. Hilarie Campbell with finding of 2 small sessile polyps in the cecum and a 4 mm polyp in the sigmoid colon. Path on the polyps,all consistent with tubular adenomas. She had a normal-appearing anastomosis and no internal hemorrhoids. She was recommended to have follow-up in 3 years. She comes in today stating that she had one episode in September 2017 with noticing a streak of blood on a bowel movement. She did not notice any more blood until November and had 1 more episode noticing dark blood on a bowel movement. About 2 weeks ago she had a third episode. She shows me a picture of a formed brown stool with a streak of red blood mixed in. She'Campbell not had any further bleeding since. She denies any rectal pain pressure or discomfort. No abdominal pain. She says her stools have always been narrower in caliber since her surgery and has not noted any recent changes. Patient has been in otherwise very good health. She does mention that she has had 3 grandparents deceased from colorectal cancer.   Review of Systems Pertinent positive and negative review of systems were noted in the above HPI section.  All other review of systems was otherwise negative.  Outpatient Encounter Prescriptions as of 01/07/2016  Medication Sig  . acetaminophen (TYLENOL) 325 MG tablet Take 650 mg by mouth every 6 (six) hours as needed for mild pain, fever or headache.  . Aspirin-Acetaminophen-Caffeine (EXCEDRIN PO) Take 1 tablet by mouth as needed. HEADACHE  . Calcium Citrate-Vitamin D (CALCIUM CITRATE + PO)  Take 600 mg by mouth 2 (two) times daily.   . Cholecalciferol (VITAMIN D) 2000 UNITS tablet Take 2,000 Units by mouth daily.   . clonazePAM (KLONOPIN) 1 MG tablet Take 1 mg by mouth at bedtime as needed. for sleep  . ibuprofen (ADVIL) 200 MG tablet Take 200 mg by mouth as needed.  . rizatriptan (MAXALT) 10 MG tablet Take 10 mg by mouth as needed. Reported on 01/27/2015  . Na Sulfate-K Sulfate-Mg Sulf 17.5-3.13-1.6 GM/180ML SOLN Take 1 kit by mouth once.  . [DISCONTINUED] zolpidem (AMBIEN) 10 MG tablet Take 10 mg by mouth at bedtime as needed. Reported on 01/27/2015  . [DISCONTINUED] zonisamide (ZONEGRAN) 50 MG capsule Take 50 mg by mouth daily. Takes 3 tablets at bedtime   No facility-administered encounter medications on file as of 01/07/2016.    No Known Allergies Patient Active Problem List   Diagnosis Date Noted  . Pelvic pain 05/11/2011  . Polyuria 05/11/2011  . Bowel habit changes 05/11/2011  . Heartburn 02/06/2011  . Postoperative anemia 01/12/2011  . Insomnia 12/15/2010  . Rectal cancer, T1N0 Campbell/p LAR 12/02/2010 11/01/2010  . Family history of malignant neoplasm of gastrointestinal tract 09/02/2010   Social History   Social History  . Marital status: Married    Spouse name: N/A  . Number of children: 2  . Years of education: N/A   Occupational History  .  [employer]   Social History Main Topics  . Smoking status: Never Smoker  . Smokeless tobacco:  Never Used  . Alcohol use 0.0 oz/week     Comment: socially  . Drug use: No  . Sexual activity: Yes    Birth control/ protection: Post-menopausal   Other Topics Concern  . Not on file   Social History Narrative  . No narrative on file    Natasha Campbell'Campbell family history includes Colon cancer in her maternal grandmother, paternal grandfather, and paternal grandmother; Heart disease in her father and mother.      Objective:    Vitals:   01/07/16 0958  BP: 120/70  Pulse: 76    Physical Exam well-developed older white  female in no acute distress, quite pleasant blood pressure 120/70 pulse 76, BMI 24.1. HEENT; nontraumatic normocephalic EOMI PERRLA sclera anicteric, Cardiovascular ;regular rate and rhythm with S1-S2 no murmur or gallop, Pulmonary; clear bilaterally, Abdomen ;soft, nontender nondistended bowel sounds are present, there is low midline incisional scar him and no palpable mass or hepatosplenomegaly Rectal ;exam not done, Ext; no clubbing cyanosis or edema skin warm and dry, Neuropsych; mood and affect appropriate       Assessment & Plan:   #36 66 year old white female with history of rectal adenocarcinoma 2012,, T1 N0 lesion status post low anterior resection. Last colonoscopy proximally 1 year ago with removal of 3 small adenomatous polyps, normal-appearing anastomosis and no internal hemorrhoids Patient presents now with 3 or 4 episodes of noticing red blood streaked in with her bowel movements. Rule out recurrent colon lesion  #2 family history of colorectal cancer in 3 of her grandparents  Plan; Patient will be scheduled for colonoscopy with Dr. Hilarie Campbell. Procedure discussed in detail with the patient including risks and benefits and she is agreeable to proceed.   Natasha Campbell Natasha Bober PA-C 01/07/2016   Cc: Natasha Kern, MD   Addendum: Reviewed and agree with initial management. Natasha Bears, MD

## 2016-02-03 HISTORY — PX: COLON SURGERY: SHX602

## 2016-02-07 ENCOUNTER — Ambulatory Visit (AMBULATORY_SURGERY_CENTER): Payer: BLUE CROSS/BLUE SHIELD | Admitting: Internal Medicine

## 2016-02-07 ENCOUNTER — Encounter: Payer: Self-pay | Admitting: Internal Medicine

## 2016-02-07 ENCOUNTER — Telehealth: Payer: Self-pay

## 2016-02-07 VITALS — BP 127/72 | HR 76 | Temp 97.8°F | Resp 16 | Ht 65.0 in | Wt 146.0 lb

## 2016-02-07 DIAGNOSIS — D128 Benign neoplasm of rectum: Secondary | ICD-10-CM

## 2016-02-07 DIAGNOSIS — K625 Hemorrhage of anus and rectum: Secondary | ICD-10-CM

## 2016-02-07 DIAGNOSIS — Z85038 Personal history of other malignant neoplasm of large intestine: Secondary | ICD-10-CM

## 2016-02-07 MED ORDER — SODIUM CHLORIDE 0.9 % IV SOLN: 500.0000 mL | INTRAVENOUS | Status: DC

## 2016-02-07 NOTE — Progress Notes (Signed)
Report given to PACU, vss 

## 2016-02-07 NOTE — Progress Notes (Signed)
Called to room to assist during endoscopic procedure.  Patient ID and intended procedure confirmed with present staff. Received instructions for my participation in the procedure from the performing physician.  

## 2016-02-07 NOTE — Telephone Encounter (Signed)
Pt scheduled to see Dr. Johney Maine 02/08/16@3 :15pm, pt to arrive there at 2:45pm. Pt aware of appt.

## 2016-02-07 NOTE — Telephone Encounter (Signed)
-----   Message from Jerene Bears, MD sent at 02/07/2016 11:45 AM EST ----- Regarding: [facility] referral Please refer patient to Neysa Bonito at [place] for appt ASAP for rectal polyp and consideration of transanal excision Hx of rectosigmoid colon cancer and Dr. Johney Maine operated for this in 2012 JMP

## 2016-02-07 NOTE — Patient Instructions (Signed)
YOU HAD AN ENDOSCOPIC PROCEDURE TODAY AT THE [facility]:   Refer to the procedure report that was given to you for any specific questions about what was found during the examination.  If the procedure report does not answer your questions, please call your gastroenterologist to clarify.  If you requested that your care partner not be given the details of your procedure findings, then the procedure report has been included in a sealed envelope for you to review at your convenience later.  YOU SHOULD EXPECT: Some feelings of bloating in the abdomen. Passage of more gas than usual.  Walking can help get rid of the air that was put into your GI tract during the procedure and reduce the bloating. If you had a lower endoscopy (such as a colonoscopy or flexible sigmoidoscopy) you may notice spotting of blood in your stool or on the toilet paper. If you underwent a bowel prep for your procedure, you may not have a normal bowel movement for a few days.  Please Note:  You might notice some irritation and congestion in your nose or some drainage.  This is from the oxygen used during your procedure.  There is no need for concern and it should clear up in a day or so.  SYMPTOMS TO REPORT IMMEDIATELY:   Following lower endoscopy (colonoscopy or flexible sigmoidoscopy):  Excessive amounts of blood in the stool  Significant tenderness or worsening of abdominal pains  Swelling of the abdomen that is new, acute  Fever of 100F or higher For urgent or emergent issues, a gastroenterologist can be reached at any hour by calling [phone].  DIET:  We do recommend a small meal at first, but then you may proceed to your regular diet.  Drink plenty of fluids but you should avoid alcoholic beverages for 24 hours.  Thank you for allowing us to take care of your healthcare needs today.  ACTIVITY:  You should plan to take it easy for the rest of today and you should NOT DRIVE or use heavy machinery until  tomorrow (because of the sedation medicines used during the test).    FOLLOW UP: Our staff will call the number listed on your records the next business day following your procedure to check on you and address any questions or concerns that you may have regarding the information given to you following your procedure. If we do not reach you, we will leave a message.  However, if you are feeling well and you are not experiencing any problems, there is no need to return our call.  We will assume that you have returned to your regular daily activities without incident.  If any biopsies were taken you will be contacted by phone or by letter within the next 1-3 weeks.  Please call us at [phone] if you have not heard about the biopsies in 3 weeks.    SIGNATURES/CONFIDENTIALITY: You and/or your care partner have signed paperwork which will be entered into your electronic medical record.  These signatures attest to the fact that that the information above on your After Visit Summary has been reviewed and is understood.  Full responsibility of the confidentiality of this discharge information lies with you and/or your care-partner. 

## 2016-02-07 NOTE — Op Note (Signed)
[facility] Patient Name: Natasha Campbell Procedure Date: 02/07/2016 10:32 AM MRN: [id number] Endoscopist: Jerene Bears , MD Age: 66 Referring MD:  Date of Birth: [birth year] Gender: Female Account #: [id number] Procedure:                Colonoscopy Indications:              Rectal bleeding, Personal history of malignant                            neoplasm of the rectosigmoid colon s/p LAR 2012 Medicines:                Monitored Anesthesia Care Procedure:                Pre-Anesthesia Assessment:                           - Prior to the procedure, a History and Physical                            was performed, and patient medications and                            allergies were reviewed. The patient's tolerance of                            previous anesthesia was also reviewed. The risks                            and benefits of the procedure and the sedation                            options and risks were discussed with the patient.                            All questions were answered, and informed consent                            was obtained. Prior Anticoagulants: The patient has                            taken no previous anticoagulant or antiplatelet                            agents. ASA Grade Assessment: II - A patient with                            mild systemic disease. After reviewing the risks                            and benefits, the patient was deemed in                            satisfactory condition to undergo the procedure.  After obtaining informed consent, the colonoscope                            was passed under direct vision. Throughout the                            procedure, the patient's blood pressure, pulse, and                            oxygen saturations were monitored continuously. The                            Model [id number]-[id number]) scope was introduced                            through the anus  and advanced to the the cecum,                            identified by appendiceal orifice and ileocecal                            valve. The colonoscopy was performed without                            difficulty. The patient tolerated the procedure                            well. The quality of the bowel preparation was                            excellent. The ileocecal valve, appendiceal                            orifice, and rectum were photographed. Scope In: 10:44:49 AM Scope Out: 10:59:09 AM Scope Withdrawal Time: 0 hours 11 minutes 57 seconds  Total Procedure Duration: 0 hours 14 minutes 20 seconds  Findings:                 The digital rectal exam revealed a mobile rectal                            polypoid lesion palpated 0.5 cm from the anal                            verge. The mass was non-circumferential and located                            predominantly at the posterior bowel wall.                           A 20 mm polyp was found in the distal rectum. This                            polyp was examined with NBI and  white light and                            approximates the dentate line. The polyp was                            sessile and spreading laterally. Due to location at                            dentate line, this lesion was not removed                            endoscopically. This was biopsied with a cold                            forceps for histology.                           There was evidence of a prior end-to-end                            colo-colonic anastomosis in the recto-sigmoid                            colon. This was patent and was characterized by                            healthy appearing mucosa which was slightly nodular                            in one location (likely granulation tissue). The                            anastomosis was traversed. This was biopsied with a                            cold forceps for histology.                            The exam was otherwise without abnormality. Complications:            No immediate complications. Estimated Blood Loss:     Estimated blood loss was minimal. Impression:               - Rectal polypoid lesion palpable by rectal exam,                            0.5 cm from the anal verge.                           - One 20 mm polyp in the distal rectum                            approximating the dentate line. Biopsied.                           -  Patent end-to-end colo-colonic anastomosis.                            Biopsied.                           - The examination was otherwise normal. Recommendation:           - Patient has a contact number available for                            emergencies. The signs and symptoms of potential                            delayed complications were discussed with the                            patient. Return to normal activities tomorrow.                            Written discharge instructions were provided to the                            patient.                           - Resume previous diet.                           - Continue present medications.                           - Await pathology results.                           - Repeat colonoscopy is recommended for                            surveillance. The colonoscopy date will be                            determined after pathology results from today's                            exam become available for review. Jerene Bears, MD 02/07/2016 11:10:31 AM This report has been signed electronically.

## 2016-02-08 ENCOUNTER — Ambulatory Visit: Payer: Self-pay | Admitting: Surgery

## 2016-02-08 ENCOUNTER — Telehealth: Payer: Self-pay

## 2016-02-08 NOTE — Telephone Encounter (Signed)
No answer. Left message that we will attempt to reach her later today.

## 2016-02-08 NOTE — Telephone Encounter (Signed)
  Follow up Call-  Call back number 02/07/2016 01/27/2015  Post procedure Call Back phone  # [phone]  Permission to leave phone message Yes Yes  Some recent data might be hidden     Patient questions:  Do you have a fever, pain , or abdominal swelling? No. Pain Score  0 *  Have you tolerated food without any problems? Yes.    Have you been able to return to your normal activities? Yes.    Do you have any questions about your discharge instructions: Diet   No. Medications  No. Follow up visit  No.  Do you have questions or concerns about your Care? No.  Actions: * If pain score is 4 or above: No action needed, pain <4.

## 2016-02-08 NOTE — H&P (Signed)
Natasha Campbell 02/08/2016 3:33 PM Location: [facility] Patient #: [id number] DOB: [birth year] Married / Language: English / Race: White Female  Patient Care Team: Velna Hatchet, Campbell as PCP - General (Internal Medicine) Michael Boston, Campbell as Consulting Physician (General Surgery) Jerene Bears, Campbell as Consulting Physician (Gastroenterology)   History of Present Illness Natasha Campbell; 02/08/2016 4:34 PM) The patient is a 66 year old female who presents with a colorectal polyp. Note for "Colorectal polyp": Patient sent for surgical consultation at the request her gastroenterologist, Dr. Zenovia Jarred. Concern for new rectal polyp.  Pleasant woman. History of polyps. Had recurrent polyp at the rectosigmoid junction. I did a low anterior resection on her in 2012. Came back as a T1 cancer. Had colonoscopy 2013. Completely clean. Had 3 year follow-up A few small polyps removed. Less than removed a year ago with 3 tubular adenomas. Patent 33 EEA anastomosis noted on prior colonoscopies. Patient notes new episode of rectal bleeding. Underwent colonoscopy. 15 mm rectal mass felt. Biopsy consistent with tubular adenoma high-grade dysplasia. Surgical consultation requested.  She comes in today with her husband. Has had some irregular bowels but usually better controlled with Metamucil. Does not smoke. No prior surgeries. No other health issues since I operated her 5 years ago. Occasional blood in her stools.     Diagnosis 1. Colon, biopsy, recto-sigmoid anastomosis BXS - BENIGN COLONIC MUCOSA WITH LYMPHOID AGGREGATES. - NO DYSPLASIA OR MALIGNANCY. 2. Rectum, polyp(s), rectum distal polyps BXs, polyp - SUPERFICIAL FRAGMENTS OF TUBULOVILLOUS ADENOMA WITH HIGH GRADE DYSPLASIA. Microscopic Comment 2. The fragments are superficial and may not be representative of the entire lesion. Dr. Lyndon Code has reviewed the case. The case was called to Dr. Hilarie Fredrickson on 02/08/2016. Natasha Males  Campbell Pathologist, Electronic Sign   Past Surgical History Natasha Lorenzo, LPN; QA348G 624THL PM) Colon Polyp Removal - Colonoscopy Colon Removal - Partial Oral Surgery  Diagnostic Studies History Natasha Lorenzo, LPN; QA348G 624THL PM) Mammogram within last year Pap Smear 1-5 years ago  Allergies Natasha Lorenzo, LPN; QA348G 075-GRM PM) No Known Drug Allergies 02/08/2016  Medication History Natasha Lorenzo, LPN; QA348G [id number] PM) ClonazePAM (1MG  Tablet, Oral) Active. Rizatriptan Benzoate (10MG  Tablet, Oral) Active. Excedrin (250-250-65MG  Tablet, Oral as needed) Active. Calcium Citrate (950MG  Tablet, Oral) Active. Ibuprofen (200MG  Capsule, Oral as needed) Active. Aspirin (325MG  Tablet, Oral as needed) Active. Medications Reconciled  Social History Natasha Lorenzo, LPN; QA348G 624THL PM) Alcohol use Occasional alcohol use. Caffeine use Carbonated beverages, Coffee, Tea. No drug use Tobacco use Never smoker.  Family History Natasha Lorenzo, LPN; QA348G 624THL PM) Arthritis Mother. Migraine Headache Sister.  Pregnancy / Birth History Natasha Lorenzo, LPN; QA348G 624THL PM) Age at menarche 43 years. Age of menopause 69-55 Gravida 2 Length (months) of breastfeeding 7-12 Maternal age 45-30 Para 2  Other Problems Natasha Lorenzo, LPN; QA348G 624THL PM) Arthritis Back Pain Colon Cancer Migraine Headache     Review of Systems Natasha Lorenzo LPN; QA348G 624THL PM) General Not Present- Appetite Loss, Chills, Fatigue, Fever, Night Sweats, Weight Gain and Weight Loss. Skin Not Present- Change in Wart/Mole, Dryness, Hives, Jaundice, New Lesions, Non-Healing Wounds, Rash and Ulcer. HEENT Not Present- Earache, Hearing Loss, Hoarseness, Nose Bleed, Oral Ulcers, Ringing in the Ears, Seasonal Allergies, Sinus Pain, Sore Throat, Visual Disturbances, Wears glasses/contact lenses and Yellow Eyes. Respiratory Not Present- Bloody sputum, Chronic Cough, Difficulty  Breathing, Snoring and Wheezing. Breast Not Present- Breast Mass, Breast Pain, Nipple Discharge and Skin Changes. Cardiovascular Not Present- Chest  Pain, Difficulty Breathing Lying Down, Leg Cramps, Palpitations, Rapid Heart Rate, Shortness of Breath and Swelling of Extremities. Gastrointestinal Present- Bloody Stool and Change in Bowel Habits. Not Present- Abdominal Pain, Bloating, Chronic diarrhea, Constipation, Difficulty Swallowing, Excessive gas, Gets full quickly at meals, Hemorrhoids, Indigestion, Nausea, Rectal Pain and Vomiting. Female Genitourinary Present- Frequency and Urgency. Not Present- Nocturia, Painful Urination and Pelvic Pain. Musculoskeletal Not Present- Back Pain, Joint Pain, Joint Stiffness, Muscle Pain, Muscle Weakness and Swelling of Extremities. Neurological Present- Headaches. Not Present- Decreased Memory, Fainting, Numbness, Seizures, Tingling, Tremor, Trouble walking and Weakness. Psychiatric Not Present- Anxiety, Bipolar, Change in Sleep Pattern, Depression, Fearful and Frequent crying. Endocrine Not Present- Cold Intolerance, Excessive Hunger, Hair Changes, Heat Intolerance, Hot flashes and New Diabetes. Hematology Not Present- Blood Thinners, Easy Bruising, Excessive bleeding, Gland problems, HIV and Persistent Infections.  Vitals Claiborne Billings Dockery LPN; QA348G [id number] PM) 02/08/2016 3:34 PM Weight: 146.8 lb Height: 65.5in Body Surface Area: 1.74 m Body Mass Index: 24.06 kg/m  Temp.: 98.73F(Oral)  Pulse: 75 (Regular)  BP: 118/70 (Sitting, Left Arm, Standard)      Physical Exam Natasha Campbell; 02/08/2016 4:08 PM)  General Mental Status-Alert. General Appearance-Not in acute distress, Not Sickly. Orientation-Oriented X3. Hydration-Well hydrated. Voice-Normal.  Integumentary Global Assessment Upon inspection and palpation of skin surfaces of the - Axillae: non-tender, no inflammation or ulceration, no drainage. and Distribution of  scalp and body hair is normal. General Characteristics Temperature - normal warmth is noted.  Head and Neck Head-normocephalic, atraumatic with no lesions or palpable masses. Face Global Assessment - atraumatic, no absence of expression. Neck Global Assessment - no abnormal movements, no bruit auscultated on the right, no bruit auscultated on the left, no decreased range of motion, non-tender. Trachea-midline. Thyroid Gland Characteristics - non-tender.  Eye Eyeball - Left-Extraocular movements intact, No Nystagmus. Eyeball - Right-Extraocular movements intact, No Nystagmus. Cornea - Left-No Hazy. Cornea - Right-No Hazy. Sclera/Conjunctiva - Left-No scleral icterus, No Discharge. Sclera/Conjunctiva - Right-No scleral icterus, No Discharge. Pupil - Left-Direct reaction to light normal. Pupil - Right-Direct reaction to light normal.  ENMT Ears Pinna - Left - no drainage observed, no generalized tenderness observed. Right - no drainage observed, no generalized tenderness observed. Nose and Sinuses External Inspection of the Nose - no destructive lesion observed. Inspection of the nares - Left - quiet respiration. Right - quiet respiration. Mouth and Throat Lips - Upper Lip - no fissures observed, no pallor noted. Lower Lip - no fissures observed, no pallor noted. Nasopharynx - no discharge present. Oral Cavity/Oropharynx - Tongue - no dryness observed. Oral Mucosa - no cyanosis observed. Hypopharynx - no evidence of airway distress observed.  Chest and Lung Exam Inspection Movements - Normal and Symmetrical. Accessory muscles - No use of accessory muscles in breathing. Palpation Palpation of the chest reveals - Non-tender. Auscultation Breath sounds - Normal and Clear.  Cardiovascular Auscultation Rhythm - Regular. Murmurs & Other Heart Sounds - Auscultation of the heart reveals - No Murmurs and No Systolic Clicks.  Abdomen Inspection Inspection of the  abdomen reveals - No Visible peristalsis and No Abnormal pulsations. Umbilicus - No Bleeding, No Urine drainage. Palpation/Percussion Palpation and Percussion of the abdomen reveal - Soft, Non Tender, No Rebound tenderness, No Rigidity (guarding) and No Cutaneous hyperesthesia. Note: 15 mm pedunculated polyp. Slightly left posterior midline. About 1 cm from the sphincters. Mobile. Friable.   Perianal skin clean with good hygiene. No pruritis ani. No pilonidal disease. No fissure. No abscess/fistula. Normal sphincter tone.  No external hemorrhoids. No condyloma warts. Tolerates digital rectal exam. No other rectal masses. Hemorrhoidal piles normal. Exam done with assistance of female Medical Assistant in the room.  Female Genitourinary Sexual Maturity Tanner 5 - Adult hair pattern. Note: No vaginal bleeding nor discharge  Peripheral Vascular Upper Extremity Inspection - Left - No Cyanotic nailbeds, Not Ischemic. Right - No Cyanotic nailbeds, Not Ischemic.  Neurologic Neurologic evaluation reveals -normal attention span and ability to concentrate, able to name objects and repeat phrases. Appropriate fund of knowledge , normal sensation and normal coordination. Mental Status Affect - not angry, not paranoid. Cranial Nerves-Normal Bilaterally. Gait-Normal.  Neuropsychiatric Mental status exam performed with findings of-able to articulate well with normal speech/language, rate, volume and coherence, thought content normal with ability to perform basic computations and apply abstract reasoning and no evidence of hallucinations, delusions, obsessions or homicidal/suicidal ideation.  Musculoskeletal Global Assessment Spine, Ribs and Pelvis - no instability, subluxation or laxity. Right Upper Extremity - no instability, subluxation or laxity.  Lymphatic Head & Neck  General Head & Neck Lymphatics: Bilateral - Description - No Localized lymphadenopathy. Axillary  General Axillary  Region: Bilateral - Description - No Localized lymphadenopathy. Femoral & Inguinal  Generalized Femoral & Inguinal Lymphatics: Left - Description - No Localized lymphadenopathy. Right - Description - No Localized lymphadenopathy.    Assessment & Plan Natasha Campbell; 02/08/2016 4:29 PM)  ADENOMATOUS RECTAL POLYP (D12.8) Impression: Small pedunculated very distal rectal polyp with high-grade dysplasia. I think she would benefit from transanal excision. Would use TEM system to try and ensure good margins. Most likely can transition to a [place] for the distal aspect externally. Lithotomy positioning.  Current Plans The anatomy & physiology of the digestive tract was discussed. The pathophysiology of the rectal pathology was discussed. Natural history risks without surgery was discussed. I feel the risks of no intervention will lead to serious problems that outweigh the operative risks; therefore, I recommended surgery.  Laparoscopic & open abdominal techniques were discussed. I recommended we start with a partial proctectomy by transanal endoscopic microsurgery (TEM) for excisional biopsy to remove the pathology and hopefully cure and/or control the pathology. This technique can offer less operative risk and faster post-operative recovery. Possible need for immediate or later abdominal surgery for further treatment was discussed.  Risks such as bleeding, abscess, reoperation, ostomy, heart attack, death, and other risks were discussed. I noted a good likelihood this will help address the problem. Goals of post-operative recovery were discussed as well. We will work to minimize complications. An educational handout was given as well. Questions were answered. The patient expresses understanding & wishes to proceed with surgery.  Consider follow up colonoscopy by your gastroenterologist.  Since you had a colorectal cancer resected by surgery, you should strongly consider getting a  colonoscopy by your gastroenterologict in one year after the surgery that removed your cancer. Call your gastroenterologist for advice  Pt Education - [facility] colon polyps - education ENCOUNTER FOR PREOPERATIVE EXAMINATION FOR GENERAL SURGICAL PROCEDURE (Z01.818)  Current Plans You are being scheduled for surgery- Our schedulers will call you.  You should hear from our office's scheduling department within 5 working days about the location, date, and time of surgery. We try to make accommodations for patient's preferences in scheduling surgery, but sometimes the OR schedule or the surgeon's schedule prevents [place] from making those accommodations.  If you have not heard from our office [phone]) in 5 working days, call the office and ask for your  surgeon's nurse.  If you have other questions about your diagnosis, plan, or surgery, call the office and ask for your surgeon's nurse.  Pt Education - [facility] Rectal Prep for Anorectal outpatient/office surgery: discussed with patient and provided information. Pt Education - [facility] Rectal Surgery HCI (Chela Sutphen): discussed with patient and provided information. CHRONIC CONSTIPATION (K59.09)  Current Plans Pt Education - [facility] Constipation (AT) Pt Education - [facility] Good Bowel Health (Alisha Bacus)  Natasha Hector, M.D., F.A.C.S. Gastrointestinal and Minimally Invasive Surgery [facility] [address], [place], [place] [address] [phone] Main / Paging

## 2016-02-22 NOTE — Patient Instructions (Signed)
Natasha Campbell  02/22/2016   Your procedure is scheduled on: 02-29-16  Report to [facility] Main  Entrance take [facility]  elevators to 3rd floor to  [place] at 930AM.  Call this number if you have problems the morning of surgery [phone]   Remember: ONLY 1 PERSON MAY GO WITH YOU TO SHORT STAY TO GET  READY MORNING OF [place].  Do not eat food After Midnight on Sunday 02-27-16. Drink clear liquids all day Monday 02-28-16 and follow all bowel and surgery prep from Dr. Johney Maine office. NOTHING BY MOUTH AFTER MIDNIGHT Monday 02-18-16!!     Take these medicines the morning of surgery with A SIP OF WATER: TYLENOL AS NEEDED                                You may not have any metal on your body including hair pins and              piercings  Do not wear jewelry, make-up, lotions, powders or perfumes, deodorant             Do not wear nail polish.  Do not shave  48 hours prior to surgery.              Men may shave face and neck.   Do not bring valuables to the hospital. [place].  Contacts, dentures or bridgework may not be worn into surgery.  Leave suitcase in the car. After surgery it may be brought to your room.     Patients discharged the day of surgery will not be allowed to drive home.  Name and phone number of your driver:  Special Instructions: N/A              Please read over the following fact sheets you were given: _____________________________________________________________________                CLEAR LIQUID DIET   Foods Allowed                                                                     Foods Excluded  Coffee and tea, regular and decaf                             liquids that you cannot  Plain Jell-O in any flavor                                             see through such as: Fruit ices (not with fruit pulp)                                     milk, soups, orange juice   Iced Popsicles  All solid food Carbonated beverages, regular and diet                                    Cranberry, grape and apple juices Sports drinks like Gatorade Lightly seasoned clear broth or consume(fat free) Sugar, honey syrup  Sample Menu Breakfast                                Lunch                                     Supper Cranberry juice                    Beef broth                            Chicken broth Jell-O                                     Grape juice                           Apple juice Coffee or tea                        Jell-O                                      Popsicle                                                Coffee or tea                        Coffee or tea  _____________________________________________________________________  [facility] - Preparing for Surgery Before surgery, you can play an important role.  Because skin is not sterile, your skin needs to be as free of germs as possible.  You can reduce the number of germs on your skin by washing with CHG (chlorahexidine gluconate) soap before surgery.  CHG is an antiseptic cleaner which kills germs and bonds with the skin to continue killing germs even after washing. Please DO NOT use if you have an allergy to CHG or antibacterial soaps.  If your skin becomes reddened/irritated stop using the CHG and inform your nurse when you arrive at Short Stay. Do not shave (including legs and underarms) for at least 48 hours prior to the first CHG shower.  You may shave your face/neck. Please follow these instructions carefully:  1.  Shower with CHG Soap the night before surgery and the  morning of Surgery.  2.  If you choose to wash your hair, wash your hair first as usual with your  normal  shampoo.  3.  After you shampoo, rinse your hair and body thoroughly to remove the  shampoo.  4.  Use CHG as you would any other liquid soap.  You can apply chg  directly  to the skin and wash                       Gently with a scrungie or clean washcloth.  5.  Apply the CHG Soap to your body ONLY FROM THE NECK DOWN.   Do not use on face/ open                           Wound or open sores. Avoid contact with eyes, ears mouth and genitals (private parts).                       Wash face,  Genitals (private parts) with your normal soap.             6.  Wash thoroughly, paying special attention to the area where your surgery  will be performed.  7.  Thoroughly rinse your body with warm water from the neck down.  8.  DO NOT shower/wash with your normal soap after using and rinsing off  the CHG Soap.                9.  Pat yourself dry with a clean towel.            10.  Wear clean pajamas.            11.  Place clean sheets on your bed the night of your first shower and do not  sleep with pets. Day of Surgery : Do not apply any lotions/deodorants the morning of surgery.  Please wear clean clothes to the hospital/surgery center.  FAILURE TO FOLLOW THESE INSTRUCTIONS MAY RESULT IN THE CANCELLATION OF YOUR SURGERY PATIENT SIGNATURE_________________________________  NURSE SIGNATURE__________________________________  ________________________________________________________________________

## 2016-02-23 ENCOUNTER — Encounter (HOSPITAL_COMMUNITY)
Admission: RE | Admit: 2016-02-23 | Discharge: 2016-02-23 | Disposition: A | Discharge status: Home / Self Care | Payer: BLUE CROSS/BLUE SHIELD | Source: Ambulatory Visit | Attending: Surgery | Admitting: Surgery

## 2016-02-23 ENCOUNTER — Encounter (HOSPITAL_COMMUNITY): Payer: Self-pay

## 2016-02-23 DIAGNOSIS — Z01818 Encounter for other preprocedural examination: Secondary | ICD-10-CM | POA: Diagnosis present

## 2016-02-23 DIAGNOSIS — K621 Rectal polyp: Secondary | ICD-10-CM | POA: Insufficient documentation

## 2016-02-23 LAB — CBC
HCT: 40.9 % (ref 36.0–46.0)
Hemoglobin: 13.1 g/dL (ref 12.0–15.0)
MCH: 29.2 pg (ref 26.0–34.0)
MCHC: 32 g/dL (ref 30.0–36.0)
MCV: 91.1 fL (ref 78.0–100.0)
PLATELETS: 187 10*3/uL (ref 150–400)
RBC: 4.49 MIL/uL (ref 3.87–5.11)
RDW: 13.9 % (ref 11.5–15.5)
WBC: 4.3 10*3/uL (ref 4.0–10.5)

## 2016-02-24 LAB — HEMOGLOBIN A1C
Hgb A1c MFr Bld: 5.5 % (ref 4.8–5.6)
MEAN PLASMA GLUCOSE: 111

## 2016-02-29 ENCOUNTER — Ambulatory Visit (HOSPITAL_COMMUNITY): Payer: BLUE CROSS/BLUE SHIELD | Admitting: Certified Registered Nurse Anesthetist

## 2016-02-29 ENCOUNTER — Encounter (HOSPITAL_COMMUNITY): Payer: Self-pay | Admitting: *Deleted

## 2016-02-29 ENCOUNTER — Ambulatory Visit (HOSPITAL_COMMUNITY)
Admission: RE | Admit: 2016-02-29 | Discharge: 2016-02-29 | Disposition: A | Discharge status: Home / Self Care | Payer: BLUE CROSS/BLUE SHIELD | Source: Ambulatory Visit | Attending: Surgery | Admitting: Surgery

## 2016-02-29 ENCOUNTER — Encounter (HOSPITAL_COMMUNITY)
Admission: RE | Disposition: A | Discharge status: Home / Self Care | Payer: Self-pay | Source: Ambulatory Visit | Attending: Surgery

## 2016-02-29 DIAGNOSIS — K621 Rectal polyp: Secondary | ICD-10-CM | POA: Diagnosis present

## 2016-02-29 DIAGNOSIS — Z91030 Bee allergy status: Secondary | ICD-10-CM | POA: Diagnosis not present

## 2016-02-29 DIAGNOSIS — C20 Malignant neoplasm of rectum: Secondary | ICD-10-CM | POA: Insufficient documentation

## 2016-02-29 DIAGNOSIS — Z7982 Long term (current) use of aspirin: Secondary | ICD-10-CM | POA: Insufficient documentation

## 2016-02-29 DIAGNOSIS — Z888 Allergy status to other drugs, medicaments and biological substances status: Secondary | ICD-10-CM | POA: Insufficient documentation

## 2016-02-29 DIAGNOSIS — K5900 Constipation, unspecified: Secondary | ICD-10-CM | POA: Diagnosis not present

## 2016-02-29 DIAGNOSIS — Z800 Family history of malignant neoplasm of digestive organs: Secondary | ICD-10-CM | POA: Insufficient documentation

## 2016-02-29 DIAGNOSIS — N189 Chronic kidney disease, unspecified: Secondary | ICD-10-CM | POA: Diagnosis not present

## 2016-02-29 DIAGNOSIS — M19042 Primary osteoarthritis, left hand: Secondary | ICD-10-CM | POA: Insufficient documentation

## 2016-02-29 DIAGNOSIS — D128 Benign neoplasm of rectum: Secondary | ICD-10-CM

## 2016-02-29 DIAGNOSIS — Z85048 Personal history of other malignant neoplasm of rectum, rectosigmoid junction, and anus: Secondary | ICD-10-CM | POA: Insufficient documentation

## 2016-02-29 DIAGNOSIS — G4700 Insomnia, unspecified: Secondary | ICD-10-CM | POA: Diagnosis not present

## 2016-02-29 DIAGNOSIS — M19041 Primary osteoarthritis, right hand: Secondary | ICD-10-CM | POA: Insufficient documentation

## 2016-02-29 HISTORY — PX: PARTIAL PROCTECTOMY BY TEM: SHX6011

## 2016-02-29 LAB — POCT I-STAT EG7
ACID-BASE EXCESS: 1 mmol/L (ref 0.0–2.0)
Bicarbonate: 26.3 mmol/L (ref 20.0–28.0)
CALCIUM ION: 1.21 mmol/L (ref 1.15–1.40)
HEMATOCRIT: 40 % (ref 36.0–46.0)
HEMOGLOBIN: 13.6 g/dL (ref 12.0–15.0)
O2 SAT: 89 %
PCO2 VEN: 44.5 mmHg (ref 44.0–60.0)
PH VEN: 7.38 (ref 7.250–7.430)
POTASSIUM: 3.8 mmol/L (ref 3.5–5.1)
Patient temperature: 37
SODIUM: 141 mmol/L (ref 135–145)
TCO2: 28 mmol/L (ref 0–100)
pO2, Ven: 58 mmHg — ABNORMAL HIGH (ref 32.0–45.0)

## 2016-02-29 SURGERY — PARTIAL PROCTECTOMY BY TEM
Anesthesia: General | Site: Rectum

## 2016-02-29 MED ORDER — ROCURONIUM BROMIDE 50 MG/5ML IV SOSY
PREFILLED_SYRINGE | INTRAVENOUS | Status: AC
  Filled 2016-02-29: qty 5

## 2016-02-29 MED ORDER — MIDAZOLAM HCL 5 MG/5ML IJ SOLN
INTRAMUSCULAR | Status: DC | PRN
  Administered 2016-02-29: 2 mg via INTRAVENOUS

## 2016-02-29 MED ORDER — LIDOCAINE 2% (20 MG/ML) 5 ML SYRINGE
INTRAMUSCULAR | Status: AC
  Filled 2016-02-29: qty 5

## 2016-02-29 MED ORDER — CELECOXIB 200 MG PO CAPS
400.0000 mg | ORAL_CAPSULE | ORAL | Status: AC
  Administered 2016-02-29: 400 mg via ORAL
  Filled 2016-02-29: qty 2

## 2016-02-29 MED ORDER — NEOMYCIN SULFATE 500 MG PO TABS: 1000.0000 mg | ORAL_TABLET | ORAL | Status: DC

## 2016-02-29 MED ORDER — OXYCODONE HCL 5 MG PO TABS: 5.0000 mg | ORAL_TABLET | ORAL | Status: DC | PRN

## 2016-02-29 MED ORDER — ENOXAPARIN SODIUM 40 MG/0.4ML ~~LOC~~ SOLN
40.0000 mg | Freq: Once | SUBCUTANEOUS | Status: AC
  Administered 2016-02-29: 40 mg via SUBCUTANEOUS
  Filled 2016-02-29: qty 0.4

## 2016-02-29 MED ORDER — LIDOCAINE 2% (20 MG/ML) 5 ML SYRINGE
INTRAMUSCULAR | Status: AC
  Filled 2016-02-29: qty 10

## 2016-02-29 MED ORDER — PROMETHAZINE HCL 25 MG/ML IJ SOLN: 6.2500 mg | INTRAMUSCULAR | Status: DC | PRN

## 2016-02-29 MED ORDER — ROCURONIUM BROMIDE 50 MG/5ML IV SOSY
PREFILLED_SYRINGE | INTRAVENOUS | Status: DC | PRN
  Administered 2016-02-29: 50 mg via INTRAVENOUS
  Administered 2016-02-29: 10 mg via INTRAVENOUS

## 2016-02-29 MED ORDER — OXYCODONE HCL 5 MG PO TABS: 5.0000 mg | ORAL_TABLET | ORAL | 0 refills | Status: DC | PRN

## 2016-02-29 MED ORDER — PROPOFOL 10 MG/ML IV BOLUS
INTRAVENOUS | Status: DC | PRN
  Administered 2016-02-29: 150 mg via INTRAVENOUS

## 2016-02-29 MED ORDER — SUCCINYLCHOLINE CHLORIDE 200 MG/10ML IV SOSY
PREFILLED_SYRINGE | INTRAVENOUS | Status: AC
  Filled 2016-02-29: qty 10

## 2016-02-29 MED ORDER — BUPIVACAINE LIPOSOME 1.3 % IJ SUSP
INTRAMUSCULAR | Status: DC | PRN
  Administered 2016-02-29: 20 mL

## 2016-02-29 MED ORDER — 0.9 % SODIUM CHLORIDE (POUR BTL) OPTIME
TOPICAL | Status: DC | PRN
  Administered 2016-02-29: 1000 mL

## 2016-02-29 MED ORDER — PROPOFOL 10 MG/ML IV BOLUS
INTRAVENOUS | Status: AC
  Filled 2016-02-29: qty 20

## 2016-02-29 MED ORDER — BUPIVACAINE HCL (PF) 0.25 % IJ SOLN
INTRAMUSCULAR | Status: DC | PRN
  Administered 2016-02-29 (×2): 30 mL

## 2016-02-29 MED ORDER — LACTATED RINGERS IR SOLN
Status: DC | PRN
  Administered 2016-02-29: 1000 mL

## 2016-02-29 MED ORDER — SUCCINYLCHOLINE CHLORIDE 200 MG/10ML IV SOSY
PREFILLED_SYRINGE | INTRAVENOUS | Status: DC | PRN
  Administered 2016-02-29: 100 mg via INTRAVENOUS

## 2016-02-29 MED ORDER — SUGAMMADEX SODIUM 200 MG/2ML IV SOLN
INTRAVENOUS | Status: DC | PRN
  Administered 2016-02-29: 200 mg via INTRAVENOUS

## 2016-02-29 MED ORDER — ONDANSETRON HCL 4 MG/2ML IJ SOLN
INTRAMUSCULAR | Status: AC
  Filled 2016-02-29: qty 2

## 2016-02-29 MED ORDER — BUPIVACAINE HCL (PF) 0.25 % IJ SOLN
INTRAMUSCULAR | Status: AC
  Filled 2016-02-29: qty 60

## 2016-02-29 MED ORDER — LIDOCAINE 2% (20 MG/ML) 5 ML SYRINGE
INTRAMUSCULAR | Status: DC | PRN
  Administered 2016-02-29: 1.5 mg/kg/h via INTRAVENOUS

## 2016-02-29 MED ORDER — DEXAMETHASONE SODIUM PHOSPHATE 10 MG/ML IJ SOLN
INTRAMUSCULAR | Status: AC
  Filled 2016-02-29: qty 1

## 2016-02-29 MED ORDER — SODIUM CHLORIDE 0.9% FLUSH: 3.0000 mL | Freq: Two times a day (BID) | INTRAVENOUS | Status: DC

## 2016-02-29 MED ORDER — GENTAMICIN SULFATE 40 MG/ML IJ SOLN
INTRAMUSCULAR | Status: DC
  Filled 2016-02-29: qty 6

## 2016-02-29 MED ORDER — PHENYLEPHRINE HCL 10 MG/ML IJ SOLN
INTRAMUSCULAR | Status: DC | PRN
  Administered 2016-02-29 (×4): 40 ug via INTRAVENOUS

## 2016-02-29 MED ORDER — SODIUM CHLORIDE 0.9 % IV SOLN: 250.0000 mL | INTRAVENOUS | Status: DC | PRN

## 2016-02-29 MED ORDER — BUPIVACAINE LIPOSOME 1.3 % IJ SUSP
20.0000 mL | INTRAMUSCULAR | Status: DC
  Filled 2016-02-29: qty 20

## 2016-02-29 MED ORDER — METRONIDAZOLE 500 MG PO TABS: 1000.0000 mg | ORAL_TABLET | ORAL | Status: DC

## 2016-02-29 MED ORDER — LACTATED RINGERS IV SOLN
INTRAVENOUS | Status: DC
  Administered 2016-02-29 (×2): via INTRAVENOUS

## 2016-02-29 MED ORDER — EPHEDRINE SULFATE 50 MG/ML IJ SOLN
INTRAMUSCULAR | Status: DC | PRN
  Administered 2016-02-29: 5 mg via INTRAVENOUS

## 2016-02-29 MED ORDER — ACETAMINOPHEN 500 MG PO TABS
1000.0000 mg | ORAL_TABLET | ORAL | Status: AC
  Administered 2016-02-29: 1000 mg via ORAL
  Filled 2016-02-29: qty 2

## 2016-02-29 MED ORDER — FENTANYL CITRATE (PF) 100 MCG/2ML IJ SOLN
INTRAMUSCULAR | Status: DC | PRN
  Administered 2016-02-29 (×4): 50 ug via INTRAVENOUS

## 2016-02-29 MED ORDER — SODIUM CHLORIDE 0.9% FLUSH: 3.0000 mL | INTRAVENOUS | Status: DC | PRN

## 2016-02-29 MED ORDER — MEPERIDINE HCL 50 MG/ML IJ SOLN: 6.2500 mg | INTRAMUSCULAR | Status: DC | PRN

## 2016-02-29 MED ORDER — MIDAZOLAM HCL 2 MG/2ML IJ SOLN
INTRAMUSCULAR | Status: AC
  Filled 2016-02-29: qty 2

## 2016-02-29 MED ORDER — SUGAMMADEX SODIUM 500 MG/5ML IV SOLN
INTRAVENOUS | Status: AC
Start: 2016-02-29 — End: 2016-02-29
  Filled 2016-02-29: qty 5

## 2016-02-29 MED ORDER — DEXTROSE 5 % IV SOLN: 2.0000 g | INTRAVENOUS | Status: DC

## 2016-02-29 MED ORDER — HYDROMORPHONE HCL 1 MG/ML IJ SOLN: 0.2500 mg | INTRAMUSCULAR | Status: DC | PRN

## 2016-02-29 MED ORDER — POLYETHYLENE GLYCOL 3350 17 GM/SCOOP PO POWD: 1.0000 | Freq: Once | ORAL | Status: DC

## 2016-02-29 MED ORDER — BISACODYL 5 MG PO TBEC: 20.0000 mg | DELAYED_RELEASE_TABLET | Freq: Once | ORAL | Status: DC

## 2016-02-29 MED ORDER — CEFOTETAN DISODIUM-DEXTROSE 2-2.08 GM-% IV SOLR
2.0000 g | Freq: Once | INTRAVENOUS | Status: AC
  Administered 2016-02-29: 2 g via INTRAVENOUS
  Filled 2016-02-29: qty 50

## 2016-02-29 MED ORDER — FENTANYL CITRATE (PF) 100 MCG/2ML IJ SOLN: 25.0000 ug | INTRAMUSCULAR | Status: DC | PRN

## 2016-02-29 MED ORDER — GABAPENTIN 300 MG PO CAPS
300.0000 mg | ORAL_CAPSULE | ORAL | Status: AC
  Administered 2016-02-29: 300 mg via ORAL
  Filled 2016-02-29: qty 1

## 2016-02-29 MED ORDER — EPHEDRINE 5 MG/ML INJ
INTRAVENOUS | Status: AC
  Filled 2016-02-29: qty 10

## 2016-02-29 MED ORDER — ONDANSETRON HCL 4 MG/2ML IJ SOLN
INTRAMUSCULAR | Status: DC | PRN
  Administered 2016-02-29: 4 mg via INTRAVENOUS

## 2016-02-29 MED ORDER — FENTANYL CITRATE (PF) 250 MCG/5ML IJ SOLN
INTRAMUSCULAR | Status: AC
  Filled 2016-02-29: qty 5

## 2016-02-29 MED ORDER — DIBUCAINE 1 % RE OINT
TOPICAL_OINTMENT | RECTAL | Status: AC
  Filled 2016-02-29: qty 28

## 2016-02-29 SURGICAL SUPPLY — 53 items
BLADE SURG 15 STRL LF DISP TIS (BLADE) IMPLANT
BLADE SURG 15 STRL SS (BLADE)
BRIEF STRETCH FOR OB PAD LRG (UNDERPADS AND DIAPERS) ×2 IMPLANT
CABLE HIGH FREQUENCY MONO STRZ (ELECTRODE) ×2 IMPLANT
COVER SURGICAL LIGHT HANDLE (MISCELLANEOUS) IMPLANT
DRAPE LAPAROTOMY T 102X78X121 (DRAPES) ×2 IMPLANT
DRAPE SURG IRRIG POUCH 19X23 (DRAPES) ×2 IMPLANT
DRAPE WARM FLUID 44X44 (DRAPE) ×2 IMPLANT
DRSG PAD ABDOMINAL 8X10 ST (GAUZE/BANDAGES/DRESSINGS) IMPLANT
ELECT PENCIL ROCKER SW 15FT (MISCELLANEOUS) ×2 IMPLANT
ELECT REM PT RETURN 9FT ADLT (ELECTROSURGICAL) ×2
ELECTRODE REM PT RTRN 9FT ADLT (ELECTROSURGICAL) ×1 IMPLANT
GAUZE SPONGE 4X4 12PLY STRL (GAUZE/BANDAGES/DRESSINGS) ×2 IMPLANT
GAUZE SPONGE 4X4 16PLY XRAY LF (GAUZE/BANDAGES/DRESSINGS) ×2 IMPLANT
GLOVE ECLIPSE 8.0 STRL XLNG CF (GLOVE) ×4 IMPLANT
GLOVE INDICATOR 8.0 STRL GRN (GLOVE) ×4 IMPLANT
GOWN STRL REUS W/TWL XL LVL3 (GOWN DISPOSABLE) ×6 IMPLANT
HOLDER FOLEY CATH W/STRAP (MISCELLANEOUS) ×2 IMPLANT
KIT BASIN OR (CUSTOM PROCEDURE TRAY) ×2 IMPLANT
LEGGING LITHOTOMY PAIR STRL (DRAPES) ×2 IMPLANT
LUBRICANT JELLY K Y 4OZ (MISCELLANEOUS) ×2 IMPLANT
NEEDLE HYPO 22GX1.5 SAFETY (NEEDLE) ×2 IMPLANT
PACK BASIC VI WITH GOWN DISP (CUSTOM PROCEDURE TRAY) ×2 IMPLANT
PAD ABD 8X10 STRL (GAUZE/BANDAGES/DRESSINGS) ×2 IMPLANT
PAD POSITIONING PINK XL (MISCELLANEOUS) ×2 IMPLANT
RETRACTOR LONE STAR DISPOSABLE (INSTRUMENTS) IMPLANT
RETRACTOR STAY HOOK 5MM (MISCELLANEOUS) IMPLANT
SCISSORS LAP 5X35 DISP (ENDOMECHANICALS) ×2 IMPLANT
SET IRRIG TUBING LAPAROSCOPIC (IRRIGATION / IRRIGATOR) ×2 IMPLANT
SHEARS HARMONIC ACE PLUS 36CM (ENDOMECHANICALS) ×2 IMPLANT
STOPCOCK 4 WAY LG BORE MALE ST (IV SETS) ×2 IMPLANT
SUT CHROMIC 3 0 SH 27 (SUTURE) IMPLANT
SUT PDS AB 2-0 CT2 27 (SUTURE) IMPLANT
SUT PDS AB 3-0 SH 27 (SUTURE) IMPLANT
SUT SILK 2 0 (SUTURE)
SUT SILK 2 0 SH CR/8 (SUTURE) IMPLANT
SUT SILK 2-0 18XBRD TIE 12 (SUTURE) IMPLANT
SUT SILK 3 0 SH 30 (SUTURE) IMPLANT
SUT SILK 3 0 SH CR/8 (SUTURE) IMPLANT
SUT V-LOC BARB 180 2/0GR6 GS22 (SUTURE)
SUT VIC AB 2-0 UR6 27 (SUTURE) ×10 IMPLANT
SUT VIC AB 3-0 SH 27 (SUTURE)
SUT VIC AB 3-0 SH 27XBRD (SUTURE) IMPLANT
SUTURE V-LC BRB 180 2/0GR6GS22 (SUTURE) IMPLANT
SYR 20CC LL (SYRINGE) ×2 IMPLANT
SYR BULB IRRIGATION 50ML (SYRINGE) IMPLANT
TOWEL OR 17X26 10 PK STRL BLUE (TOWEL DISPOSABLE) ×2 IMPLANT
TOWEL OR NON WOVEN STRL DISP B (DISPOSABLE) ×2 IMPLANT
TRAY FOLEY CATH 14FRSI W/METER (CATHETERS) ×2 IMPLANT
TRAY FOLEY W/METER SILVER 16FR (SET/KITS/TRAYS/PACK) IMPLANT
TUBING CONNECTING 10 (TUBING) IMPLANT
TUBING INSUF HEATED (TUBING) ×2 IMPLANT
YANKAUER SUCT BULB TIP 10FT TU (MISCELLANEOUS) IMPLANT

## 2016-02-29 NOTE — Op Note (Addendum)
02/29/2016  1:41 PM  PATIENT:  Natasha Campbell  66 y.o. female  Patient Care Team: Velna Hatchet, MD as PCP - General (Internal Medicine) Michael Boston, MD as Consulting Physician (General Surgery) Jerene Bears, MD as Consulting Physician (Gastroenterology)  PRE-OPERATIVE DIAGNOSIS:  distal rectal polyp with high-grade dysplasia  POST-OPERATIVE DIAGNOSIS:  Distal rectal polyp with high-grade dysplasia  PROCEDURE:  PARTIAL PROCTECTOMY BY TEM OF RECTAL MASS  SURGEON:  Adin Hector, MD  ASSISTANT: RN   ANESTHESIA:   Anorectal local block (quarter percent bupivacaine at beginning & liposomal bupivacaine at the end of the case) and general  EBL:  Total I/O In: 1000 [I.V.:1000] Out: 225 [Urine:200; Blood:25]  Delay start of Pharmacological VTE agent (>24hrs) due to surgical blood loss or risk of bleeding:  no  DRAINS: none   SPECIMEN:  Source of Specimen:  Distal rectal polyp (see OR findings)  DISPOSITION OF SPECIMEN:  PATHOLOGY  COUNTS:  YES  PLAN OF CARE: Admit for overnight observation  PATIENT DISPOSITION:  PACU - hemodynamically stable.  INDICATION: Patient with history of early rectal cancer was in a proximal rectal polyp status post low anterior resection in 2012.  Patient had follow-up colonoscopy and found to have very distal polyp.  Adenomatous with high-grade dysplasia.  Prior stapled anastomosis intact without recurrence.   Surgical resection recommended.  The anatomy & physiology of the digestive tract was discussed.  The pathophysiology of the rectal pathology was discussed.  Natural history risks without surgery was discussed.   I feel the risks of no intervention will lead to serious problems that outweigh the operative risks; therefore, I recommended surgery.    Laparoscopic & open abdominal techniques were discussed.  I recommended we start with a partial proctectomy by transanal endoscopic microsurgery (TEM) for excisional biopsy to remove the pathology and  hopefully cure and/or control the pathology.  This technique can offer less operative risk and faster post-operative recovery.  Possible need for immediate or later abdominal surgery for further treatment was discussed.   Risks such as bleeding, abscess, reoperation, ostomy, heart attack, death, and other risks were discussed.   I noted a good likelihood this will help address the problem.  Goals of post-operative recovery were discussed as well.  We will work to minimize complications.  An educational handout was given as well.  Questions were answered.  The patient expresses understanding & wishes to proceed with surgery.  OR FINDINGS: 20 x 12 mm lobulated mostly soft polyp in the distal rectum posterior midline primarily.  Involving 20% of the circumference.  The resulting mass was 4x4cm cm in size  Pin placement on pathology specimen: Proximal margin: purple Distal margin: dark black Right lateral margin: white Left lateral margin: yellow  The closure rests 0 cm from the anal verge in the posterior 40% location  DESCRIPTION: Informed consent was confirmed.  Patient received general anesthesia without difficulty.  Foley catheter sterilely placed.  Sequential compression devices active during the entire case.  The patient was placed in the lithotomy position, taking extra care to secure and protect the patient appropriately.  The perineum and perianal regions were prepped and draped in a sterile fashion.  Surgical timeout confirmed our plan.  I did a gentle digital rectal examination with gradual anal dilation to allow placement of the 4 cm TEO Stortz scope.  This was secured to the bed using the TEO clamping system.  We induced carbon dioxide insufflation intraluminally.  I oriented the Stortz scope and  the patient such that the specimen rested towards the floor at the 6:00 position.  DESCRIPTION: Informed consent was confirmed.  Patient received general anesthesia without difficulty.  Foley  catheter sterilely placed.  Sequential compression devices active during the entire case.  The patient was placed in the prone position, taking extra care to secure and protect the patient appropriately.  The perineum and perianal regions were prepped and draped in a sterile fashion.  Surgical timeout confirmed our plan.  I did a gentle digital rectal examination with gradual anal dilation to allow placement of the 4 cm TEO Stortz scope.  This was secured to the bed using the TEO clamping system.  We induced carbon dioxide insufflation intraluminally.  I oriented the Stortz scope and the patient such that the specimen rested towards the floor at the 6:00 position.  I could easily identify the mass.  I went ahead and used tip point cautery to mark 1-1.5cm cm margins circumferentially.  I then did a full thickness transection at the distal margin.  I came around laterally.  Worked to free it over the sphincter complex.  Inflamed planes but distally able to keep the sphincter complex intact.  Switched over to harmonic dissection.  Lifted the specimen off the pelvic canal for a good deep margin.  I gradually came more proximally.  Transected at the proximal margin.  I ensured hemostasis.  I inspected the main specimen and pinned it on thick cork board.  Pins as noted above.  I walked the specimen down to pathology and showed the [facility] pathology team for proper orientation.    I went back in and scrubbed in.  Hemostasis excellent.   I could feel an intact sphincter complex.  Resulting wound involving posterior 40% of the rectal wall and anal canal.  I reapproximated the distal posterior rectal wound over a middle-sized Hill-Ferguson retractor with a 2-0 vicryl horizontal mattress interrupted stitches to bring the corners of the wound together, working to close the wound transversely towards the middle.  There was tension posterior midline given the prior lower anterior resection and fair mobility of the rectal  wall, but overall things came together relatively well.  Did not close some perianal skin vertically with some interrupted mattress sutures to bring things well.  I did leave 5 mm opening distally to allow some perianal drainage.  Hemostasis good.  The lumen was quite patent.  Carbon dioxide evacuated & instruments removed.  The patient is being extubated go to recovery room.  I am about to discussed operative findings with the patient's family.  Adin Hector, M.D., F.A.C.S. Gastrointestinal and Minimally Invasive Surgery [facility] [address], [place], [place] [address] [phone] Main / Paging

## 2016-02-29 NOTE — Anesthesia Postprocedure Evaluation (Signed)
Anesthesia Post Note  Patient: Natasha Campbell  Procedure(s) Performed: Procedure(s) (LRB): PARTIAL PROCTECTOMY BY TEM OF RECTAL MASS ERAS PATHWAY (N/A)  Patient location during evaluation: PACU Anesthesia Type: General Level of consciousness: awake and alert Pain management: pain level controlled Vital Signs Assessment: post-procedure vital signs reviewed and stable Respiratory status: spontaneous breathing, nonlabored ventilation, respiratory function stable and patient connected to nasal cannula oxygen Cardiovascular status: blood pressure returned to baseline and stable Postop Assessment: no signs of nausea or vomiting Anesthetic complications: no       Last Vitals:  Vitals:   02/29/16 1443 02/29/16 1645  BP: 135/77 (!) 154/87  Pulse: 88 98  Resp: 15 16  Temp: 36.7 C 36.8 C    Last Pain:  Vitals:   02/29/16 1645  TempSrc: Oral  PainSc: 2                  Montez Hageman

## 2016-02-29 NOTE — H&P (View-Only) (Signed)
Natasha Campbell 02/08/2016 3:33 PM Location: [facility] Patient #: [id number] DOB: 09-26-1950 Married / Language: English / Race: White Female  Patient Care Team: Natasha Hatchet, MD as PCP - General (Internal Medicine) Natasha Boston, MD as Consulting Physician (General Surgery) Natasha Bears, MD as Consulting Physician (Gastroenterology)   History of Present Illness Natasha Hector MD; 02/08/2016 4:34 PM) The patient is a 66 year old female who presents with a colorectal polyp. Note for "Colorectal polyp": Patient sent for surgical consultation at the request her gastroenterologist, Natasha Campbell. Concern for new rectal polyp.  Pleasant woman. History of polyps. Had recurrent polyp at the rectosigmoid junction. I did a low anterior resection on her in 2012. Came back as a T1 cancer. Had colonoscopy 2013. Completely clean. Had 3 year follow-up A few small polyps removed. Less than removed a year ago with 3 tubular adenomas. Patent 33 EEA anastomosis noted on prior colonoscopies. Patient notes new episode of rectal bleeding. Underwent colonoscopy. 15 mm rectal mass felt. Biopsy consistent with tubular adenoma high-grade dysplasia. Surgical consultation requested.  She comes in today with her husband. Has had some irregular bowels but usually better controlled with Metamucil. Does not smoke. No prior surgeries. No other health issues since I operated her 5 years ago. Occasional blood in her stools.     Diagnosis 1. Colon, biopsy, recto-sigmoid anastomosis BXS - BENIGN COLONIC MUCOSA WITH LYMPHOID AGGREGATES. - NO DYSPLASIA OR MALIGNANCY. 2. Rectum, polyp(s), rectum distal polyps BXs, polyp - SUPERFICIAL FRAGMENTS OF TUBULOVILLOUS ADENOMA WITH HIGH GRADE DYSPLASIA. Microscopic Comment 2. The fragments are superficial and may not be representative of the entire lesion. Dr. Lyndon Campbell has reviewed the case. The case was called to Dr. Hilarie Campbell on 02/08/2016. Natasha Campbell, Electronic Sign   Past Surgical History Natasha Lorenzo, LPN; QA348G 624THL PM) Colon Polyp Removal - Colonoscopy Colon Removal - Partial Oral Surgery  Diagnostic Studies History Natasha Lorenzo, LPN; QA348G 624THL PM) Mammogram within last year Pap Smear 1-5 years ago  Allergies Natasha Lorenzo, LPN; QA348G 075-GRM PM) No Known Drug Allergies 02/08/2016  Medication History Natasha Lorenzo, LPN; QA348G [id number] PM) ClonazePAM (1MG  Tablet, Oral) Active. Rizatriptan Benzoate (10MG  Tablet, Oral) Active. Excedrin (250-250-65MG  Tablet, Oral as needed) Active. Calcium Citrate (950MG  Tablet, Oral) Active. Ibuprofen (200MG  Capsule, Oral as needed) Active. Aspirin (325MG  Tablet, Oral as needed) Active. Medications Reconciled  Social History Natasha Lorenzo, LPN; QA348G 624THL PM) Alcohol use Occasional alcohol use. Caffeine use Carbonated beverages, Coffee, Tea. No drug use Tobacco use Never smoker.  Family History Natasha Lorenzo, LPN; QA348G 624THL PM) Arthritis Mother. Migraine Headache Sister.  Pregnancy / Birth History Natasha Lorenzo, LPN; QA348G 624THL PM) Age at menarche 39 years. Age of menopause 29-55 Gravida 2 Length (months) of breastfeeding 7-12 Maternal age 41-30 Para 2  Other Problems Natasha Lorenzo, LPN; QA348G 624THL PM) Arthritis Back Pain Colon Cancer Migraine Headache     Review of Systems Natasha Lorenzo LPN; QA348G 624THL PM) General Not Present- Appetite Loss, Chills, Fatigue, Fever, Night Sweats, Weight Gain and Weight Loss. Skin Not Present- Change in Wart/Mole, Dryness, Hives, Jaundice, New Lesions, Non-Healing Wounds, Rash and Ulcer. HEENT Not Present- Earache, Hearing Loss, Hoarseness, Nose Bleed, Oral Ulcers, Ringing in the Ears, Seasonal Allergies, Sinus Pain, Sore Throat, Visual Disturbances, Wears glasses/contact lenses and Yellow Eyes. Respiratory Not Present- Bloody sputum, Chronic Cough, Difficulty  Breathing, Snoring and Wheezing. Breast Not Present- Breast Mass, Breast Pain, Nipple Discharge and Skin Changes. Cardiovascular Not Present- Chest  Pain, Difficulty Breathing Lying Down, Leg Cramps, Palpitations, Rapid Heart Rate, Shortness of Breath and Swelling of Extremities. Gastrointestinal Present- Bloody Stool and Change in Bowel Habits. Not Present- Abdominal Pain, Bloating, Chronic diarrhea, Constipation, Difficulty Swallowing, Excessive gas, Gets full quickly at meals, Hemorrhoids, Indigestion, Nausea, Rectal Pain and Vomiting. Female Genitourinary Present- Frequency and Urgency. Not Present- Nocturia, Painful Urination and Pelvic Pain. Musculoskeletal Not Present- Back Pain, Joint Pain, Joint Stiffness, Muscle Pain, Muscle Weakness and Swelling of Extremities. Neurological Present- Headaches. Not Present- Decreased Memory, Fainting, Numbness, Seizures, Tingling, Tremor, Trouble walking and Weakness. Psychiatric Not Present- Anxiety, Bipolar, Change in Sleep Pattern, Depression, Fearful and Frequent crying. Endocrine Not Present- Cold Intolerance, Excessive Hunger, Hair Changes, Heat Intolerance, Hot flashes and New Diabetes. Hematology Not Present- Blood Thinners, Easy Bruising, Excessive bleeding, Gland problems, HIV and Persistent Infections.  Vitals Claiborne Billings Dockery LPN; QA348G [id number] PM) 02/08/2016 3:34 PM Weight: 146.8 lb Height: 65.5in Body Surface Area: 1.74 m Body Mass Index: 24.06 kg/m  Temp.: 98.10F(Oral)  Pulse: 75 (Regular)  BP: 118/70 (Sitting, Left Arm, Standard)      Physical Exam Natasha Hector MD; 02/08/2016 4:08 PM)  General Mental Status-Alert. General Appearance-Not in acute distress, Not Sickly. Orientation-Oriented X3. Hydration-Well hydrated. Voice-Normal.  Integumentary Global Assessment Upon inspection and palpation of skin surfaces of the - Axillae: non-tender, no inflammation or ulceration, no drainage. and Distribution of  scalp and body hair is normal. General Characteristics Temperature - normal warmth is noted.  Head and Neck Head-normocephalic, atraumatic with no lesions or palpable masses. Face Global Assessment - atraumatic, no absence of expression. Neck Global Assessment - no abnormal movements, no bruit auscultated on the right, no bruit auscultated on the left, no decreased range of motion, non-tender. Trachea-midline. Thyroid Gland Characteristics - non-tender.  Eye Eyeball - Left-Extraocular movements intact, No Nystagmus. Eyeball - Right-Extraocular movements intact, No Nystagmus. Cornea - Left-No Hazy. Cornea - Right-No Hazy. Sclera/Conjunctiva - Left-No scleral icterus, No Discharge. Sclera/Conjunctiva - Right-No scleral icterus, No Discharge. Pupil - Left-Direct reaction to light normal. Pupil - Right-Direct reaction to light normal.  ENMT Ears Pinna - Left - no drainage observed, no generalized tenderness observed. Right - no drainage observed, no generalized tenderness observed. Nose and Sinuses External Inspection of the Nose - no destructive lesion observed. Inspection of the nares - Left - quiet respiration. Right - quiet respiration. Mouth and Throat Lips - Upper Lip - no fissures observed, no pallor noted. Lower Lip - no fissures observed, no pallor noted. Nasopharynx - no discharge present. Oral Cavity/Oropharynx - Tongue - no dryness observed. Oral Mucosa - no cyanosis observed. Hypopharynx - no evidence of airway distress observed.  Chest and Lung Exam Inspection Movements - Normal and Symmetrical. Accessory muscles - No use of accessory muscles in breathing. Palpation Palpation of the chest reveals - Non-tender. Auscultation Breath sounds - Normal and Clear.  Cardiovascular Auscultation Rhythm - Regular. Murmurs & Other Heart Sounds - Auscultation of the heart reveals - No Murmurs and No Systolic Clicks.  Abdomen Inspection Inspection of the  abdomen reveals - No Visible peristalsis and No Abnormal pulsations. Umbilicus - No Bleeding, No Urine drainage. Palpation/Percussion Palpation and Percussion of the abdomen reveal - Soft, Non Tender, No Rebound tenderness, No Rigidity (guarding) and No Cutaneous hyperesthesia. Note: 15 mm pedunculated polyp. Slightly left posterior midline. About 1 cm from the sphincters. Mobile. Friable.   Perianal skin clean with good hygiene. No pruritis ani. No pilonidal disease. No fissure. No abscess/fistula. Normal sphincter tone.  No external hemorrhoids. No condyloma warts. Tolerates digital rectal exam. No other rectal masses. Hemorrhoidal piles normal. Exam done with assistance of female Medical Assistant in the room.  Female Genitourinary Sexual Maturity Tanner 5 - Adult hair pattern. Note: No vaginal bleeding nor discharge  Peripheral Vascular Upper Extremity Inspection - Left - No Cyanotic nailbeds, Not Ischemic. Right - No Cyanotic nailbeds, Not Ischemic.  Neurologic Neurologic evaluation reveals -normal attention span and ability to concentrate, able to name objects and repeat phrases. Appropriate fund of knowledge , normal sensation and normal coordination. Mental Status Affect - not angry, not paranoid. Cranial Nerves-Normal Bilaterally. Gait-Normal.  Neuropsychiatric Mental status exam performed with findings of-able to articulate well with normal speech/language, rate, volume and coherence, thought content normal with ability to perform basic computations and apply abstract reasoning and no evidence of hallucinations, delusions, obsessions or homicidal/suicidal ideation.  Musculoskeletal Global Assessment Spine, Ribs and Pelvis - no instability, subluxation or laxity. Right Upper Extremity - no instability, subluxation or laxity.  Lymphatic Head & Neck  General Head & Neck Lymphatics: Bilateral - Description - No Localized lymphadenopathy. Axillary  General Axillary  Region: Bilateral - Description - No Localized lymphadenopathy. Femoral & Inguinal  Generalized Femoral & Inguinal Lymphatics: Left - Description - No Localized lymphadenopathy. Right - Description - No Localized lymphadenopathy.    Assessment & Plan Natasha Hector MD; 02/08/2016 4:29 PM)  ADENOMATOUS RECTAL POLYP (D12.8) Impression: Small pedunculated very distal rectal polyp with high-grade dysplasia. I think she would benefit from transanal excision. Would use TEM system to try and ensure good margins. Most likely can transition to a [place] for the distal aspect externally. Lithotomy positioning.  Current Plans The anatomy & physiology of the digestive tract was discussed. The pathophysiology of the rectal pathology was discussed. Natural history risks without surgery was discussed. I feel the risks of no intervention will lead to serious problems that outweigh the operative risks; therefore, I recommended surgery.  Laparoscopic & open abdominal techniques were discussed. I recommended we start with a partial proctectomy by transanal endoscopic microsurgery (TEM) for excisional biopsy to remove the pathology and hopefully cure and/or control the pathology. This technique can offer less operative risk and faster post-operative recovery. Possible need for immediate or later abdominal surgery for further treatment was discussed.  Risks such as bleeding, abscess, reoperation, ostomy, heart attack, death, and other risks were discussed. I noted a good likelihood this will help address the problem. Goals of post-operative recovery were discussed as well. We will work to minimize complications. An educational handout was given as well. Questions were answered. The patient expresses understanding & wishes to proceed with surgery.  Consider follow up colonoscopy by your gastroenterologist.  Since you had a colorectal cancer resected by surgery, you should strongly consider getting a  colonoscopy by your gastroenterologict in one year after the surgery that removed your cancer. Call your gastroenterologist for advice  Pt Education - [facility] colon polyps - education ENCOUNTER FOR PREOPERATIVE EXAMINATION FOR GENERAL SURGICAL PROCEDURE (Z01.818)  Current Plans You are being scheduled for surgery- Our schedulers will call you.  You should hear from our office's scheduling department within 5 working days about the location, date, and time of surgery. We try to make accommodations for patient's preferences in scheduling surgery, but sometimes the OR schedule or the surgeon's schedule prevents [place] from making those accommodations.  If you have not heard from our office [phone]) in 5 working days, call the office and ask for your  surgeon's nurse.  If you have other questions about your diagnosis, plan, or surgery, call the office and ask for your surgeon's nurse.  Pt Education - [facility] Rectal Prep for Anorectal outpatient/office surgery: discussed with patient and provided information. Pt Education - [facility] Rectal Surgery HCI (Norris Brumbach): discussed with patient and provided information. CHRONIC CONSTIPATION (K59.09)  Current Plans Pt Education - [facility] Constipation (AT) Pt Education - [facility] Good Bowel Health (Amillion Scobee)  Natasha Campbell, M.D., F.A.C.S. Gastrointestinal and Minimally Invasive Surgery [facility] [address] [place], [place] [address] [phone] Main / Paging

## 2016-02-29 NOTE — Anesthesia Preprocedure Evaluation (Addendum)
Anesthesia Evaluation  Patient identified by MRN, date of birth, ID band Patient awake    Reviewed: Allergy & Precautions, NPO status , Patient's Chart, lab work & pertinent test results  Airway Mallampati: II  TM Distance: >3 FB Neck ROM: Full    Dental no notable dental hx.    Pulmonary neg pulmonary ROS,    Pulmonary exam normal breath sounds clear to auscultation       Cardiovascular negative cardio ROS Normal cardiovascular exam Rhythm:Regular Rate:Normal     Neuro/Psych negative neurological ROS  negative psych ROS   GI/Hepatic negative GI ROS, Neg liver ROS,   Endo/Other  negative endocrine ROS  Renal/GU Renal disease     Musculoskeletal  (+) Arthritis ,   Abdominal   Peds  Hematology  (+) Blood dyscrasia, anemia ,   Anesthesia Other Findings   Reproductive/Obstetrics negative OB ROS                                                             Anesthesia Evaluation  Patient identified by MRN, date of birth, ID band Patient awake    Reviewed: Allergy & Precautions, H&P , NPO status , Patient's Chart, lab work & pertinent test results  Airway Mallampati: II TM Distance: >3 FB Neck ROM: Full    Dental No notable dental hx. (+) Teeth Intact and Dental Advisory Given   Pulmonary neg pulmonary ROS, Recent URI ,  clear to auscultation  Pulmonary exam normal       Cardiovascular neg cardio ROS Regular Normal    Neuro/Psych  Headaches, Negative Neurological ROS  Negative Psych ROS   GI/Hepatic negative GI ROS, Neg liver ROS,   Endo/Other  Negative Endocrine ROS  Renal/GU negative Renal ROS  Genitourinary negative   Musculoskeletal negative musculoskeletal ROS (+)   Abdominal   Peds negative pediatric ROS (+)  Hematology negative hematology ROS (+)   Anesthesia Other Findings   Reproductive/Obstetrics negative OB ROS                         Anesthesia Physical Anesthesia Plan  ASA: I  Anesthesia Plan: General   Post-op Pain Management:    Induction: Intravenous  Airway Management Planned: Oral ETT  Additional Equipment:   Intra-op Plan:   Post-operative Plan: Extubation in OR  Informed Consent: I have reviewed the patients History and Physical, chart, labs and discussed the procedure including the risks, benefits and alternatives for the proposed anesthesia with the patient or authorized representative who has indicated his/her understanding and acceptance.   Dental advisory given  Plan Discussed with: CRNA and Surgeon  Anesthesia Plan Comments:        Anesthesia Quick Evaluation  Anesthesia Physical Anesthesia Plan  ASA: II  Anesthesia Plan: General   Post-op Pain Management:    Induction: Intravenous  Airway Management Planned: Oral ETT  Additional Equipment:   Intra-op Plan:   Post-operative Plan: Extubation in OR  Informed Consent: I have reviewed the patients History and Physical, chart, labs and discussed the procedure including the risks, benefits and alternatives for the proposed anesthesia with the patient or authorized representative who has indicated his/her understanding and acceptance.   Dental advisory given  Plan Discussed with: CRNA  Anesthesia Plan Comments: (Per patient,  renal insufficiency is mild. She has not had electrolyte abnormalities. She is followed yearly by nephrologist locally. Will check iStat in OR. )       Anesthesia Quick Evaluation

## 2016-02-29 NOTE — Transfer of Care (Signed)
Immediate Anesthesia Transfer of Care Note  Patient: Natasha Campbell  Procedure(s) Performed: Procedure(s): PARTIAL PROCTECTOMY BY TEM OF RECTAL MASS ERAS PATHWAY (N/A)  Patient Location: PACU  Anesthesia Type:General  Level of Consciousness:  sedated, patient cooperative and responds to stimulation  Airway & Oxygen Therapy:Patient Spontanous Breathing and Patient connected to face mask oxgen  Post-op Assessment:  Report given to PACU RN and Post -op Vital signs reviewed and stable  Post vital signs:  Reviewed and stable  Last Vitals:  Vitals:   02/29/16 0933  BP: (!) 145/72  Pulse: 76  Resp: 16  Temp: 123XX123 C    Complications: No apparent anesthesia complications

## 2016-02-29 NOTE — Anesthesia Procedure Notes (Signed)
Procedure Name: Intubation Date/Time: 02/29/2016 12:10 PM Performed by: Maxwell Caul Pre-anesthesia Checklist: Patient identified, Emergency Drugs available, Suction available and Patient being monitored Patient Re-evaluated:Patient Re-evaluated prior to inductionOxygen Delivery Method: Circle system utilized Preoxygenation: Pre-oxygenation with 100% oxygen Intubation Type: IV induction Ventilation: Mask ventilation without difficulty Laryngoscope Size: Mac and 4 Grade View: Grade I Tube type: Oral Tube size: 7.5 mm Number of attempts: 1 Airway Equipment and Method: Stylet Placement Confirmation: ETT inserted through vocal cords under direct vision,  positive ETCO2 and breath sounds checked- equal and bilateral Secured at: 21 cm Tube secured with: Tape Dental Injury: Teeth and Oropharynx as per pre-operative assessment

## 2016-02-29 NOTE — Discharge Instructions (Signed)
ANORECTAL SURGERY:  °POST OPERATIVE INSTRUCTIONS ° °###################################################################### ° °EAT °Gradually transition to a high fiber diet with a fiber supplement over the next few weeks after discharge.  Start with a pureed / full liquid diet (see below) ° °WALK °Walk an hour a day.  Control your pain to do that.   ° °CONTROL PAIN °Control pain so that you can walk, sleep, tolerate sneezing/coughing, go up/down stairs. ° °HAVE A BOWEL MOVEMENT DAILY °Keep your bowels regular to avoid problems.  OK to try a laxative to override constipation.  OK to use an antidairrheal to slow down diarrhea.  Call if not better after 2 tries ° °CALL IF YOU HAVE PROBLEMS/CONCERNS °Call if you are still struggling despite following these instructions. °Call if you have concerns not answered by these instructions ° °###################################################################### ° ° ° °1. Take your usually prescribed home medications unless otherwise directed. °2. DIET: Follow a light bland diet the first 24 hours after arrival home, such as soup, liquids, crackers, etc.  Be sure to include lots of fluids daily.  Avoid fast food or heavy meals as your are more likely to get nauseated.  Eat a low fat the next few days after surgery.   °3. PAIN CONTROL: °a. Pain is best controlled by a usual combination of three different methods TOGETHER: °i. Ice/Heat °ii. Over the counter pain medication °iii. Prescription pain medication °b. Most patients will experience some swelling and discomfort in the anus/rectal area. and incisions.  Ice packs or heat (30-60 minutes up to 6 times a day) will help. Use ice for the first few days to help decrease swelling and bruising, then switch to heat such as warm towels, sitz baths, warm baths, etc to help relax tight/sore spots and speed recovery.  Some people prefer to use ice alone, heat alone, alternating between ice & heat.  Experiment to what works for you.   Swelling and bruising can take several weeks to resolve.   °c. It is helpful to take an over-the-counter pain medication regularly for the first few weeks.  Choose one of the following that works best for you: °i. Naproxen (Aleve, etc)  Two 220mg tabs twice a day °ii. Ibuprofen (Advil, etc) Three 200mg tabs four times a day (every meal & bedtime) °iii. Acetaminophen (Tylenol, etc) 500-650mg four times a day (every meal & bedtime) °d. A  prescription for pain medication (such as oxycodone, hydrocodone, etc) should be given to you upon discharge.  Take your pain medication as prescribed.  °i. If you are having problems/concerns with the prescription medicine (does not control pain, nausea, vomiting, rash, itching, etc), please call us [phone] to see if we need to switch you to a different pain medicine that will work better for you and/or control your side effect better. °ii. If you need a refill on your pain medication, please contact your pharmacy.  They will contact our office to request authorization. Prescriptions will not be filled after 5 pm or on week-ends. ° °Use a Sitz Bath 4-8 times a day for relief ° ° °Sitz Bath °A sitz bath is a warm water bath taken in the sitting position that covers only the hips and buttocks. It may be used for either healing or hygiene purposes. Sitz baths are also used to relieve pain, itching, or muscle spasms. The water may contain medicine. Moist heat will help you heal and relax.  °HOME CARE INSTRUCTIONS  °Take 3 to 4 sitz baths a day. °1. Fill the bathtub   half full with warm water. °2. Sit in the water and open the drain a little. °3. Turn on the warm water to keep the tub half full. Keep the water running constantly. °4. Soak in the water for 15 to 20 minutes. °5. After the sitz bath, pat the affected area dry first. ° ° °4. KEEP YOUR BOWELS REGULAR °a. The goal is one bowel movement a day °b. Avoid getting constipated.  Between the surgery and the pain medications, it  is common to experience some constipation.  Increasing fluid intake and taking a fiber supplement (such as Metamucil, Citrucel, FiberCon, MiraLax, etc) 1-2 times a day regularly will usually help prevent this problem from occurring.  A mild laxative (prune juice, Milk of Magnesia, MiraLax, etc) should be taken according to package directions if there are no bowel movements after 48 hours. °c. Watch out for diarrhea.  If you have many loose bowel movements, simplify your diet to bland foods & liquids for a few days.  Stop any stool softeners and decrease your fiber supplement.  Switching to mild anti-diarrheal medications (Kayopectate, Pepto Bismol) can help.  If this worsens or does not improve, please call us. ° °5. Wound Care ° °a. Remove your bandages the day after surgery.  Unless discharge instructions indicate otherwise, leave your bandage dry and in place overnight.  Remove the bandage during your first bowel movement.   °b. Wear an absorbent pad or soft cotton gauze in your underwear as needed to catch any drainage and help keep the area  °c. Keep the area clean and dry.  Bathe / shower every day.  Keep the area clean by showering / bathing over the incision / wound.   It is okay to soak an open wound to help wash it.  Wet wipes or showers / gentle washing after bowel movements is often less traumatic than regular toilet paper. °d. You will often notice bleeding with bowel movements.  This should slow down by the end of the first week of surgery °e. Expect some drainage.  This should slow down, too, by the end of the first week of surgery.  Wear an absorbent pad or soft cotton gauze in your underwear until the drainage stops. ° °6. ACTIVITIES as tolerated:   °a. You may resume regular (light) daily activities beginning the next day--such as daily self-care, walking, climbing stairs--gradually increasing activities as tolerated.  If you can walk 30 minutes without difficulty, it is safe to try more intense  activity such as jogging, treadmill, bicycling, low-impact aerobics, swimming, etc. °b. Save the most intensive and strenuous activity for last such as sit-ups, heavy lifting, contact sports, etc  Refrain from any heavy lifting or straining until you are off narcotics for pain control.   °c. DO NOT PUSH THROUGH PAIN.  Let pain be your guide: If it hurts to do something, don't do it.  Pain is your body warning you to avoid that activity for another week until the pain goes down. °d. You may drive when you are no longer taking prescription pain medication, you can comfortably sit for long periods of time, and you can safely maneuver your car and apply brakes. °e. You may have sexual intercourse when it is comfortable.  °7. FOLLOW UP in our office °a. Please call [facility] at [phone] to set up an appointment to see your surgeon in the office for a follow-up appointment approximately 2 weeks after your surgery. °b. Make sure that you call for   this appointment the day you arrive home to insure a convenient appointment time. 10. IF YOU HAVE DISABILITY OR FAMILY LEAVE FORMS, BRING THEM TO THE OFFICE FOR PROCESSING.  DO NOT GIVE THEM TO YOUR DOCTOR.        WHEN TO CALL us [phone]: 1. Poor pain control 2. Reactions / problems with new medications (rash/itching, nausea, etc)  3. Fever over 101.5 F (38.5 C) 4. Inability to urinate 5. Nausea and/or vomiting 6. Worsening swelling or bruising 7. Continued bleeding from incision. 8. Increased pain, redness, or drainage from the incision  The clinic staff is available to answer your questions during regular business hours (8:30am-5pm).  Please dont hesitate to call and ask to speak to one of our nurses for clinical concerns.   A surgeon from [facility] is always on call at the hospitals   If you have a medical emergency, go to the nearest emergency room or call 911.    [facility], [place], [place],  [place], [place]  [address] ? MAIN: [phone] ? TOLL FREE: [phone] ? FAX [phone] [email or web address]    Colon Polyps Polyps are tissue growths inside the body. Polyps can grow in many places, including the large intestine (colon). A polyp may be a round bump or a mushroom-shaped growth. You could have one polyp or several. Most colon polyps are noncancerous (benign). However, some colon polyps can become cancerous over time. What are the causes? The exact cause of colon polyps is not known. What increases the risk? This condition is more likely to develop in people who:  Have a family history of colon cancer or colon polyps.  Are older than 93 or older than 45 if they are African American.  Have inflammatory bowel disease, such as ulcerative colitis or Crohn disease.  Are overweight.  Smoke cigarettes.  Do not get enough exercise.  Drink too much alcohol.  Eat a diet that is:  High in fat and red meat.  Low in fiber.  Had childhood cancer that was treated with abdominal radiation. What are the signs or symptoms? Most polyps do not cause symptoms. If you have symptoms, they may include:  Blood coming from your rectum when having a bowel movement.  Blood in your stool.The stool may look dark red or black.  A change in bowel habits, such as constipation or diarrhea. How is this diagnosed? This condition is diagnosed with a colonoscopy. This is a procedure that uses a lighted, flexible scope to look at the inside of your colon. How is this treated? Treatment for this condition involves removing any polyps that are found. Those polyps will then be tested for cancer. If cancer is found, your health care provider will talk to you about options for colon cancer treatment. Follow these instructions at home: Diet   Eat plenty of fiber, such as fruits, vegetables, and whole grains.  Eat foods that are high in calcium and vitamin D, such as milk,  cheese, yogurt, eggs, liver, fish, and broccoli.  Limit foods high in fat, red meats, and processed meats, such as hot dogs, sausage, bacon, and lunch meats.  Maintain a healthy weight, or lose weight if recommended by your health care provider. General instructions   Do not smoke cigarettes.  Do not drink alcohol excessively.  Keep all follow-up visits as told by your health care provider. This is important. This includes keeping regularly scheduled colonoscopies. Talk to your health care provider about  when you need a colonoscopy.  Exercise every day or as told by your health care provider. Contact a health care provider if:  You have new or worsening bleeding during a bowel movement.  You have new or increased blood in your stool.  You have a change in bowel habits.  You unexpectedly lose weight. This information is not intended to replace advice given to you by your health care provider. Make sure you discuss any questions you have with your health care provider. Document Released: 09/15/2003 Document Revised: 05/27/2015 Document Reviewed: 11/09/2014 Elsevier Interactive Patient Education  2017 [employer].  Pelvic floor muscle training exercises ("Kegels") can help strengthen the muscles under the uterus, bladder, and bowel (large intestine). They can help both men and women who have problems with urine leakage or bowel control.  A pelvic floor muscle training exercise is like pretending that you have to urinate, and then holding it. You relax and tighten the muscles that control urine flow. It's important to find the right muscles to tighten.  The next time you have to urinate, start to go and then stop. Feel the muscles in your vagina, bladder, or anus get tight and move up. These are the pelvic floor muscles. If you feel them tighten, you've done the exercise right. If you are still not sure whether you are tightening the right muscles, keep in mind that all of the muscles  of the pelvic floor relax and contract at the same time. Because these muscles control the bladder, rectum, and vagina, the following tips may help: Women: Insert a finger into your vagina. Tighten the muscles as if you are holding in your urine, then let go. You should feel the muscles tighten and move up and down.  Men: Insert a finger into your rectum. Tighten the muscles as if you are holding in your urine, then let go. You should feel the muscles tighten and move up and down. These are the same muscles you would tighten if you were trying to prevent yourself from passing gas.  It is very important that you keep the following muscles relaxed while doing pelvic floor muscle training exercises: Abdominal  Buttocks (the deeper, anal sphincter muscle should contract)  Thigh   A woman can also strengthen these muscles by using a vaginal cone, which is a weighted device that is inserted into the vagina. Then you try to tighten the pelvic floor muscles to hold the device in place. If you are unsure whether you are doing the pelvic floor muscle training correctly, you can use biofeedback and electrical stimulation to help find the correct muscle group to work. Biofeedback is a method of positive reinforcement. Electrodes are placed on the abdomen and along the anal area. Some therapists place a sensor in the vagina in women or anus in men to monitor the contraction of pelvic floor muscles.  A monitor will display a graph showing which muscles are contracting and which are at rest. The therapist can help find the right muscles for performing pelvic floor muscle training exercises.   PERFORMING PELVIC FLOOR EXERCISES: 1. Begin by emptying your bladder. 2. Tighten the pelvic floor muscles and hold for a count of 10. 3. Relax the muscles completely for a count of 10. 4. Do 10 repititions, 3 to 5 times a day (morning, afternoon, and night). You can do these exercises at any time and any place. Most people  prefer to do the exercises while lying down or sitting in a chair.  After 4 - 6 weeks, most people notice some improvement. It may take as long as 3 months to see a major change. After a couple of weeks, you can also try doing a single pelvic floor contraction at times when you are likely to leak (for example, while getting out of a chair). A word of caution: Some people feel that they can speed up the progress by increasing the number of repetitions and the frequency of exercises. However, over-exercising can instead cause muscle fatigue and increase urine leakage. If you feel any discomfort in your abdomen or back while doing these exercises, you are probably doing them wrong. Breathe deeply and relax your body when you are doing these exercises. Make sure you are not tightening your stomach, thigh, buttock, or chest muscles. When done the right way, pelvic floor muscle exercises have been shown to be very effective at improving urinary continence.  Pelvic Floor Pain / Incontinence  Do you suffer from pelvic pain or incontinence? Do you have pain in the pelvis, low back or hips that is associated with sitting, walking, urination or intercourse? Have you experienced leaking of urine or feces when coughing, sneezing or laughing? Do you have pain in the pelvic area associated with cancer?  These are conditions that are common with pelvic floor muscle dysfunction. Over time, due to stress, scar tissue, surgeries and the natural course of aging, our muscles may become weak or overstressed and can spasm. This can lead to pain, weakness, incontinence or decreased quality of life.  Men and women with pelvic floor dysfunction frequently describe:  A falling out feeling. Pain or burning in the abdomen, tailbone or perineal area. Constipation or bowel elimination problems or difficulty initiating urination. Unresolved low back or hip pain. Frequency and urgency when going to the bathroom. Leaking of urine  or feces. Pain with intercourse.  [email or web address]  To make a referral or for more information about [facility] Pelvic Floor Therapy Program, call  [facility]) - [place] ([place]) - [phone] [facility]) [phone]    General Anesthesia, Adult, Care After These instructions provide you with information about caring for yourself after your procedure. Your health care provider may also give you more specific instructions. Your treatment has been planned according to current medical practices, but problems sometimes occur. Call your health care provider if you have any problems or questions after your procedure. What can I expect after the procedure? After the procedure, it is common to have:  Vomiting.  A sore throat.  Mental slowness. It is common to feel:  Nauseous.  Cold or shivery.  Sleepy.  Tired.  Sore or achy, even in parts of your body where you did not have surgery. Follow these instructions at home: For at least 24 hours after the procedure:   Do not:  Participate in activities where you could fall or become injured.  Drive.  Use heavy machinery.  Drink alcohol.  Take sleeping pills or medicines that cause drowsiness.  Make important decisions or sign legal documents.  Take care of children on your own.  Rest. Eating and drinking   If you vomit, drink water, juice, or soup when you can drink without vomiting.  Drink enough fluid to keep your urine clear or pale yellow.  Make sure you have little or no nausea before eating solid foods.  Follow the diet recommended by your health care provider. General instructions   Have a responsible adult stay with you until you  are awake and alert.  Return to your normal activities as told by your health care provider. Ask your  health care provider what activities are safe for you.  Take over-the-counter and prescription medicines only as told by your health care provider.  If you smoke, do not smoke without supervision.  Keep all follow-up visits as told by your health care provider. This is important. Contact a health care provider if:  You continue to have nausea or vomiting at home, and medicines are not helpful.  You cannot drink fluids or start eating again.  You cannot urinate after 8-12 hours.  You develop a skin rash.  You have fever.  You have increasing redness at the site of your procedure. Get help right away if:  You have difficulty breathing.  You have chest pain.  You have unexpected bleeding.  You feel that you are having a life-threatening or urgent problem. This information is not intended to replace advice given to you by your health care provider. Make sure you discuss any questions you have with your health care provider. Document Released: 03/27/2000 Document Revised: 05/24/2015 Document Reviewed: 12/03/2014 Elsevier Interactive Patient Education  2017 [employer].

## 2016-02-29 NOTE — Interval H&P Note (Signed)
History and Physical Interval Note:  02/29/2016 11:06 AM  Natasha Campbell  has presented today for surgery, with the diagnosis of distal rectal polyp with high-grade dysplasia  The various methods of treatment have been discussed with the patient and family. After consideration of risks, benefits and other options for treatment, the patient has consented to  Procedure(s): PARTIAL PROCTECTOMY BY TEM OF RECTAL MASS ERAS PATHWAY (N/A) as a surgical intervention .  The patient's history has been reviewed, patient examined, no change in status, stable for surgery.  I have reviewed the patient's chart and labs.  Questions were answered to the patient's satisfaction.     Shlomo Seres C.  I have re-reviewed the the patient's records, history, medications, and allergies.  I have re-examined the patient.  I again discussed intraoperative plans and goals of post-operative recovery.  The patient agrees to proceed.  Natasha Campbell  03/13/1950 [id number]  Patient Care Team: Velna Hatchet, MD as PCP - General (Internal Medicine) Michael Boston, MD as Consulting Physician (General Surgery) Jerene Bears, MD as Consulting Physician (Gastroenterology)  Patient Active Problem List   Diagnosis Date Noted  . Pelvic pain 05/11/2011  . Polyuria 05/11/2011  . Bowel habit changes 05/11/2011  . Heartburn 02/06/2011  . Postoperative anemia 01/12/2011  . Insomnia 12/15/2010  . Rectal cancer, T1N0 s/p LAR 12/02/2010 11/01/2010  . Family history of malignant neoplasm of gastrointestinal tract 09/02/2010    Past Medical History:  Diagnosis Date  . Arthritis    hands  . Blood in stool    history  . Blood transfusion without reported diagnosis    self reported  . Chronic headaches   . Chronic kidney disease 2014   20% kidney function failure per patient    . Family history of malignant neoplasm of gastrointestinal tract   . Headache(784.0)    migraines  . Insomnia   . Rectal cancer, T1N0 s/p LAR 12/02/2010  11/01/2010    Past Surgical History:  Procedure Laterality Date  . COLON RESECTION  12/02/2010   Procedure: COLON RESECTION LAPAROSCOPIC;  Surgeon: Adin Hector, MD;  Location: [facility];  Service: General;  Laterality: N/A;  Laparoscopic Low Anterior Resection, Rigid Proctoscopy  . COLONOSCOPY     multiple d/t rectal cancer dx  . TONSILLECTOMY AND ADENOIDECTOMY     age 33    Social History   Social History  . Marital status: Married    Spouse name: N/A  . Number of children: 2  . Years of education: N/A   Occupational History  .  [employer]   Social History Main Topics  . Smoking status: Never Smoker  . Smokeless tobacco: Never Used  . Alcohol use 0.0 oz/week     Comment: seldom  . Drug use: No  . Sexual activity: Yes    Birth control/ protection: Post-menopausal   Other Topics Concern  . Not on file   Social History Narrative  . No narrative on file    Family History  Problem Relation Age of Onset  . Heart disease Mother   . Heart disease Father     pacemaker  . Colon cancer Paternal Grandmother   . Colon cancer Paternal Grandfather   . Colon cancer Maternal Grandmother   . Stomach cancer Neg Hx   . Colon polyps Neg Hx   . Rectal cancer Neg Hx     Current Facility-Administered Medications  Medication Dose Route Frequency Provider Last Rate Last Dose  . bisacodyl (DULCOLAX) EC tablet  20 mg  20 mg Oral Once Michael Boston, MD      . bupivacaine liposome (EXPAREL) 1.3 % injection 266 mg  20 mL Infiltration On Call to [place], MD      . cefoTEtan in Dextrose 5% (CEFOTAN) IVPB 2 g  2 g Intravenous Once Michael Boston, MD      . clindamycin (CLEOCIN) 900 mg, gentamicin (GARAMYCIN) 240 mg in sodium chloride 0.9 % 1,000 mL for intraperitoneal lavage   Intraperitoneal To OR Michael Boston, MD      . lactated ringers infusion   Intravenous Continuous Nolon Nations, MD 75 mL/hr at 02/29/16 1044    . neomycin (MYCIFRADIN) tablet 1,000 mg  1,000 mg Oral 3 times per  day Michael Boston, MD       And  . metroNIDAZOLE (FLAGYL) tablet 1,000 mg  1,000 mg Oral 3 times per day Michael Boston, MD      . polyethylene glycol powder (GLYCOLAX/MIRALAX) container 255 g  1 Container Oral Once Michael Boston, MD         Allergies  Allergen Reactions  . Bee Venom Swelling  . Cortisone Other (See Comments)    Joints locked up/inability to move  . Wasp Venom Swelling    BP (!) 145/72 (BP Location: Right Arm)   Pulse 76   Temp 98.1 F (36.7 C) (Oral)   Resp 16   Ht 5' 6.5" (1.689 m)   Wt 66.5 kg (146 lb 9 oz)   SpO2 100%   BMI 23.30 kg/m   Labs: No results found for this or any previous visit (from the past 48 hour(s)).  Imaging / Studies: No results found.   Adin Hector, M.D., F.A.C.S. Gastrointestinal and Minimally Invasive Surgery [facility] [address] [place], [place] [address] [phone] Main / Paging  02/29/2016 11:06 AM

## 2016-03-01 ENCOUNTER — Encounter (HOSPITAL_COMMUNITY): Payer: Self-pay | Admitting: Surgery

## 2016-03-10 ENCOUNTER — Telehealth: Payer: Self-pay | Admitting: *Deleted

## 2016-03-10 NOTE — Telephone Encounter (Signed)
Patient already has recall in [facility] for 1 year colon recall colonoscopy as well as a 2 year recall.

## 2016-03-10 NOTE — Telephone Encounter (Signed)
-----   Message from Jerene Bears, MD sent at 03/09/2016  5:55 PM EST ----- Pt with recent rectal cancer Need to ensure repeat colonoscopy in 1 year, then annual surveillance thereafter Thanks JMP  ----- Message ----- From: Michael Boston, MD Sent: 03/09/2016   5:50 PM To: Illene Regulus, Jerene Bears, MD  Discussed at GI tumor Board.  I updated the patient's status to the patient and spouse.  Recommendations were made.  Questions were answered.  20 minute conversation.  They expressed understanding & appreciation.   This is a very early focus of cancer within a polyp.  This is one of the rare outliers that I think local resection will be adequate given its superficial level (T1sm1), no lymphovascular invasion, no perineural involvement.  Statistically the likelihood of metastatic spread is extremely low.  Nonetheless, patient would benefit from completion cancer workup.  She requires CEA and CT of chest abdomen and pelvis with oral IV contrast has standard evaluation.  If that is underwhelming, we will follow to make sure the wound closes up and most likely will need aggressive endoscopic surveillance.  I probably would do anoscopy after the wound is healed around six months and then have Dr. Hilarie Fredrickson scope in a year, possibly more aggressive annual surveillance.  I will defer to [place] made at GI Tumor Board to offer genetic testing as well.  I believe they will reach out to her.  Otherwise, Bryna Colander, see if Genetics are open to seeing her since this is her second rectal cancer in three years.  Patient notes that she had three grandparents that died of colon cancer as well.

## 2016-03-14 ENCOUNTER — Other Ambulatory Visit: Payer: Self-pay | Admitting: Surgery

## 2016-03-14 DIAGNOSIS — C20 Malignant neoplasm of rectum: Secondary | ICD-10-CM

## 2016-03-23 ENCOUNTER — Ambulatory Visit
Admission: RE | Admit: 2016-03-23 | Discharge: 2016-03-23 | Disposition: A | Discharge status: Home / Self Care | Payer: BLUE CROSS/BLUE SHIELD | Source: Ambulatory Visit | Attending: Surgery | Admitting: Surgery

## 2016-03-23 ENCOUNTER — Other Ambulatory Visit: Payer: BLUE CROSS/BLUE SHIELD

## 2016-03-23 DIAGNOSIS — C20 Malignant neoplasm of rectum: Secondary | ICD-10-CM

## 2016-04-10 ENCOUNTER — Encounter: Payer: Self-pay | Admitting: Genetics

## 2016-04-20 ENCOUNTER — Ambulatory Visit (HOSPITAL_BASED_OUTPATIENT_CLINIC_OR_DEPARTMENT_OTHER): Payer: BLUE CROSS/BLUE SHIELD | Admitting: Genetic Counselor

## 2016-04-20 ENCOUNTER — Other Ambulatory Visit: Payer: BLUE CROSS/BLUE SHIELD

## 2016-04-20 ENCOUNTER — Encounter: Payer: Self-pay | Admitting: Genetic Counselor

## 2016-04-20 DIAGNOSIS — Z803 Family history of malignant neoplasm of breast: Secondary | ICD-10-CM

## 2016-04-20 DIAGNOSIS — Z800 Family history of malignant neoplasm of digestive organs: Secondary | ICD-10-CM

## 2016-04-20 DIAGNOSIS — Z8042 Family history of malignant neoplasm of prostate: Secondary | ICD-10-CM

## 2016-04-20 DIAGNOSIS — Z7183 Encounter for nonprocreative genetic counseling: Secondary | ICD-10-CM

## 2016-04-20 DIAGNOSIS — C20 Malignant neoplasm of rectum: Secondary | ICD-10-CM

## 2016-04-20 NOTE — Progress Notes (Signed)
REFERRING PROVIDER: Velna Hatchet, MD [place], [place] [address]   Dr. Michael Boston  PRIMARY PROVIDER:  Velna Hatchet, MD  PRIMARY REASON FOR VISIT:  1. Rectal cancer, T1N0 s/p LAR 12/02/2010   2. Family history of colon cancer   3. Family history of breast cancer   4. Family history of prostate cancer      HISTORY OF PRESENT ILLNESS:   Natasha Campbell, a 66 y.o. female, was seen for a [place] cancer genetics consultation at the request of Dr. Johney Maine due to a personal and family history of cancer.  Natasha Campbell presents to clinic today to discuss the possibility of a hereditary predisposition to cancer, genetic testing, and to further clarify her future cancer risks, as well as potential cancer risks for family members.   In 2013, at the age of 25, Natasha Campbell was diagnosed with cancer of the rectum. This was treated with surgical resection. In 2018, at the age of 12, Natasha Campbell was again diagnosed with rectal cancer.  This was treated again with surgical resection. MSI and IHC were performed on the most recent cancer and were negative.     CANCER HISTORY:   No history exists.       Past Medical History:  Diagnosis Date  . Arthritis    hands  . Blood in stool    history  . Blood transfusion without reported diagnosis    self reported  . Chronic headaches   . Chronic kidney disease 2014   20% kidney function failure per patient    . Family history of breast cancer   . Family history of colon cancer   . Family history of malignant neoplasm of gastrointestinal tract   . Family history of prostate cancer   . Headache(784.0)    migraines  . Insomnia   . Rectal cancer, T1N0 s/p LAR 12/02/2010 11/01/2010    Past Surgical History:  Procedure Laterality Date  . COLON RESECTION  12/02/2010   Procedure: COLON RESECTION LAPAROSCOPIC;  Surgeon: Adin Hector, MD;  Location: [facility];  Service: General;  Laterality: N/A;  Laparoscopic Low Anterior Resection, Rigid  Proctoscopy  . COLONOSCOPY     multiple d/t rectal cancer dx  . PARTIAL PROCTECTOMY BY TEM N/A 02/29/2016   Procedure: PARTIAL PROCTECTOMY BY TEM OF RECTAL MASS ERAS PATHWAY;  Surgeon: Michael Boston, MD;  Location: [facility];  Service: General;  Laterality: N/A;  . TONSILLECTOMY AND ADENOIDECTOMY     age 20    Social History   Social History  . Marital status: Married    Spouse name: N/A  . Number of children: 2  . Years of education: N/A   Occupational History  .  [employer]   Social History Main Topics  . Smoking status: Never Smoker  . Smokeless tobacco: Never Used  . Alcohol use 0.0 oz/week     Comment: seldom  . Drug use: No  . Sexual activity: Yes    Birth control/ protection: Post-menopausal   Other Topics Concern  . None   Social History Narrative  . None     FAMILY HISTORY:  We obtained a detailed, 4-generation family history.  Significant diagnoses are listed below: Family History  Problem Relation Age of Onset  . Heart disease Mother   . Heart disease Father     pacemaker  . Colon cancer Paternal Grandmother   . Colon cancer Paternal Grandfather   . Colon cancer Maternal Grandmother   . Breast cancer  Cousin     maternal first cousin  . Prostate cancer Cousin     maternal first cousin  . Stomach cancer Neg Hx   . Colon polyps Neg Hx   . Rectal cancer Neg Hx     The patient declined having me take a family history at this time.  GENETIC COUNSELING ASSESSMENT: Natasha Campbell is a 66 y.o. female with a personal history of two colon cancers and has a family history of colon, breast and prostate cancer which is somewhat suggestive of a hereditary cancer syndrome and predisposition to cancer. We, therefore, discussed and recommended the following at today's visit.   DISCUSSION: The patient had several questions which were asked at the beginning of the session. She had recently lost her job and is looking for insurance.  She had several questions about genetic  discrimination.  We discussed the Genetic Information Nondiscrimination Act (GINA) of 2008, and the [place] and the protections that these laws provide.  She is very concerned about the sharing of information and privacy of genetic testing. We discussed who would have access to her test results, the cost of testing, and how genetic testing may benefit not only herself but also her family.  Natasha Campbell declined having me take a family or medical history at this time.  She wants to think about genetic testing, talk with her family, and get more information from her mother who is 71, and talk with her maternal cousin who had breast cancer as she thinks she may have had genetic testing in the past.  We will perform a billing investigation so she will learn what her out of pocket costs may be, and she will think further about testing.  She is going to [place] the first couple weeks of May, and if she wants to pursue genetic testing she will do it after she gets back.  PLAN: Natasha Campbell did not wish to pursue genetic testing at today's visit. We understand this decision, and remain available to coordinate genetic testing at any time in the future. We; therefore, recommend Natasha Campbell continue to follow the cancer screening guidelines given by her primary healthcare provider.  Lastly, we encouraged Natasha Campbell to remain in contact with cancer genetics annually so that we can continuously update the family history and inform her of any changes in cancer genetics and testing that may be of benefit for this family.   Ms.  Campbell questions were answered to her satisfaction today. Our contact information was provided should additional questions or concerns arise. Thank you for the referral and allowing [place] to share in the care of your patient.   Natasha Campbell, [place], [facility] Certified Genetic Counselor [email or web address] phone: [phone]  The patient was seen for a total of 30 minutes in  face-to-face genetic counseling.  This patient was discussed with Drs. Magrinat, Lindi Adie and/or Burr Medico who agrees with the above.    _______________________________________________________________________ For Office Staff:  Number of people involved in session: 2 Was an Intern/ student involved with case: no

## 2016-04-24 ENCOUNTER — Telehealth: Payer: Self-pay | Admitting: Genetic Counselor

## 2016-04-24 NOTE — Telephone Encounter (Signed)
Contacted patient with the billing investigation results performed.  Explained that her out of pocket costs would be $103 and she could apply for financial assistance.  Patient stated that was all she needed to know for now.  She enquired whether they ever do anonymous testings, and I explained that they do not.  DED: $200 ($0 remaining)  OOP Max: $2000 ($779 remaining)    Co-Insurance: 10% Patient's OOP Expense: $103 Could also apply for PAP if desired.

## 2016-05-25 ENCOUNTER — Encounter: Payer: Self-pay | Admitting: Physical Therapy

## 2016-05-25 ENCOUNTER — Ambulatory Visit: Payer: BLUE CROSS/BLUE SHIELD | Attending: Surgery | Admitting: Physical Therapy

## 2016-05-25 DIAGNOSIS — M6281 Muscle weakness (generalized): Secondary | ICD-10-CM | POA: Diagnosis present

## 2016-05-25 DIAGNOSIS — R279 Unspecified lack of coordination: Secondary | ICD-10-CM | POA: Diagnosis present

## 2016-05-25 NOTE — Patient Instructions (Addendum)
Types of Fiber  There are two main types of fiber:  insoluble and soluble.  Both of these types can prevent and relieve constipation and diarrhea, although some people find one or the other to be more easily digested.  This handout details information about both types of fiber.  Insoluble Fiber       Functions of Insoluble Fiber . moves bulk through the intestines  . controls and balances the pH (acidity) in the intestines       Benefits of Insoluble Fiber . promotes regular bowel movement and prevents constipation  . removes fecal waste through colon in less time  . keeps an optimal pH in intestines to prevent microbes from producing cancer substances, therefore preventing colon cancer        Food Sources of Insoluble Fiber . whole-wheat products  . wheat bran "miller's bran" . corn bran  . flax seed or other seeds . vegetables such as green beans, broccoli, cauliflower and potato skins  . fruit skins and root vegetable skins  . popcorn . brown rice  Soluble Fiber       Functions of Soluble Fiber  . holds water in the colon to bulk and soften the stool . prolongs stomach emptying time so that sugar is released and absorbed more slowly        Benefits of Soluble Fiber . lowers total cholesterol and LDL cholesterol (the bad cholesterol) therefore reducing the risk of heart disease  . regulates blood sugar for people with diabetes       Food Sources of Soluble Fiber . oat/oat bran . dried beans and peas  . nuts  . barley  . flax seed or other seeds . fruits such as oranges, pears, peaches, and apples  . vegetables such as carrots  . psyllium husk  . prunes  About Abdominal Massage  Abdominal massage, also called external colon massage, is a self-treatment circular massage technique that can reduce and eliminate gas and ease constipation. The colon naturally contracts in waves in a clockwise direction starting from inside the right hip, moving up toward the ribs, across  the belly, and down inside the left hip.  When you perform circular abdominal massage, you help stimulate your colon's normal wave pattern of movement called peristalsis.  It is most beneficial when done after eating.  Positioning You can practice abdominal massage with oil while lying down, or in the shower with soap.  Some people find that it is just as effective to do the massage through clothing while sitting or standing.  How to Massage Start by placing your finger tips or knuckles on your right side, just inside your hip bone.  . Make small circular movements while you move upward toward your rib cage.   . Once you reach the bottom right side of your rib cage, take your circular movements across to the left side of the bottom of your rib cage.  . Next, move downward until you reach the inside of your left hip bone.  This is the path your feces travel in your colon. . Continue to perform your abdominal massage in this pattern for 10 minutes each day.     You can apply as much pressure as is comfortable in your massage.  Start gently and build pressure as you continue to practice.  Notice any areas of pain as you massage; areas of slight pain may be relieved as you massage, but if you have areas of significant or intense pain,  consult with your healthcare provider.  Other Considerations . General physical activity including bending and stretching can have a beneficial massage-like effect on the colon.  Deep breathing can also stimulate the colon because breathing deeply activates the same nervous system that supplies the colon.   . Abdominal massage should always be used in combination with a bowel-conscious diet that is high in the proper type of fiber for you, fluids (primarily water), and a regular exercise program.

## 2016-05-25 NOTE — Therapy (Signed)
[facility] [address], [place], [place], [address] Phone: [phone]   Fax:  [phone]  Physical Therapy Evaluation  Patient Details  Name: Natasha Campbell MRN: [id number] Date of Birth: Feb 23, 1950 Referring Provider: Michael Boston, MD  Encounter Date: 05/25/2016      PT End of Session - 05/25/16 1021    Visit Number 1   Date for PT Re-Evaluation 07/20/16   PT Start Time 0930   PT Stop Time 1017   PT Time Calculation (min) 47 min   Activity Tolerance Patient tolerated treatment well   Behavior During Therapy [facility] for tasks assessed/performed      Past Medical History:  Diagnosis Date  . Arthritis    hands  . Blood in stool    history  . Blood transfusion without reported diagnosis    self reported  . Chronic headaches   . Chronic kidney disease 2014   20% kidney function failure per patient    . Family history of breast cancer   . Family history of colon cancer   . Family history of malignant neoplasm of gastrointestinal tract   . Family history of prostate cancer   . Headache(784.0)    migraines  . Insomnia   . Rectal cancer, T1N0 s/p LAR 12/02/2010 11/01/2010    Past Surgical History:  Procedure Laterality Date  . COLON RESECTION  12/02/2010   Procedure: COLON RESECTION LAPAROSCOPIC;  Surgeon: Adin Hector, MD;  Location: [facility];  Service: General;  Laterality: N/A;  Laparoscopic Low Anterior Resection, Rigid Proctoscopy  . COLONOSCOPY     multiple d/t rectal cancer dx  . PARTIAL PROCTECTOMY BY TEM N/A 02/29/2016   Procedure: PARTIAL PROCTECTOMY BY TEM OF RECTAL MASS ERAS PATHWAY;  Surgeon: Michael Boston, MD;  Location: [facility];  Service: General;  Laterality: N/A;  . TONSILLECTOMY AND ADENOIDECTOMY     age 66    There were no vitals filed for this visit.       Subjective Assessment - 05/25/16 0932    Subjective Colon CA 6 years ago, then Feb had another surgery due to cancer.  February  had surgery. Now having incontinence.  Stool is solid and normal consistency.  Some days are bad other days.  Have incontinence 2 days/week.  I will have to pull the bowel out sometimes or use hands to assist.   Pertinent History scoliosis, history of surgery, history of cancer   Limitations Walking   Patient Stated Goals improve bowel and bladder function, no incontinence   Currently in Pain? No/denies            [facility] PT Assessment - 05/25/16 0001      Assessment   Medical Diagnosis Z98.890 (ICD-10-CM) - Other specified postprocedural states   Referring Provider Michael Boston, MD   Onset Date/Surgical Date 02/29/16   Prior Therapy No     Precautions   Precautions None     Restrictions   Weight Bearing Restrictions Yes     Balance Screen   Has the patient fallen in the past 6 months No     [place] residence   Living Arrangements Spouse/significant other     Prior Function   Level of Independence Independent   Leisure daily exercise     Cognition   Overall Cognitive Status Within Functional Limits for tasks assessed     Observation/Other Assessments   Focus on Therapeutic Outcomes (FOTO)  bowel leakage  survey 50% limited; bowel constipation survey 45% limited     Posture/Postural Control   Posture/Postural Control Postural limitations   Postural Limitations Rounded Shoulders;Decreased lumbar lordosis;Posterior pelvic tilt  scoliosis     ROM / Strength   AROM / PROM / Strength Strength     Strength   Right/Left Hip Right;Left   Right Hip Flexion 4+/5   Right Hip Extension 4/5   Right Hip External Rotation  5/5   Right Hip Internal Rotation 5/5   Right Hip ABduction 4/5   Right Hip ADduction 3+/5   Left Hip Flexion 4/5   Left Hip Extension 5/5   Left Hip External Rotation 4/5   Left Hip Internal Rotation 4/5   Left Hip ABduction 4-/5   Left Hip ADduction 3+/5     Palpation   Palpation comment tight hip flexors, upper  right quadrant restrictions in abdomen     Ambulation/Gait   Gait Pattern Within Functional Limits                 Pelvic Floor Special Questions - 05/25/16 0001    Prior Pelvic/Prostate Exam Yes   Date of Last Pelvic/Prostate Exam 05/04/16   Are you Pregnant or attempting pregnancy? No   Prior Pregnancies Yes   Number of Pregnancies 2   Number of Vaginal Deliveries 2   Any difficulty with labor and deliveries No   Currently Sexually Active Yes  minmally   Is this Painful No   Urinary Leakage Yes   How often 1/week   Pad use 1/day   Activities that cause leaking Coughing;Sneezing;Laughing  fecal walking   Urinary urgency Yes  1-2x weeks   Urinary frequency 1/hour  sometimes just small amount   Fecal incontinence Yes  some days 10x   Fluid intake 40 oz mainly water  metamucil and clearlax   Caffeine beverages minimal   Falling out feeling (prolapse) No   Skin Integrity Intact   Scar Well healed   Perineal Body/Introitus  Normal   Prolapse None   Pelvic Floor Internal Exam pt informed and consent given to perform internal assessment of pelvic floor muscles   Exam Type Rectal   Sensation WNL   Palpation internal anal sphincter tightness, levators and obdurator internis tight  rectum has reduced space due to partial removal   Strength weak squeeze, no lift   Strength # of reps 2   Strength # of seconds 4   Tone high tone, weak          [facility] Adult PT Treatment/Exercise - 05/25/16 0001      Self-Care   Self-Care Other Self-Care Comments   Other Self-Care Comments  type of fiber and abdominal massage for improved BM                PT Education - 05/25/16 1022    Education provided Yes   Education Details type of fiber and abdominal massage   Person(s) Educated Patient   Methods Explanation;Handout;Demonstration;Verbal cues   Comprehension Verbalized understanding;Returned demonstration          PT Short Term Goals - 05/25/16 1051       PT SHORT TERM GOAL #1   Title improved consistency of bowel movements due to regular meals, fiber, and abdominal massage techniques   Time 4   Period Weeks   Status New     PT SHORT TERM GOAL #2   Title able to bulge pelvic floor due to increased muscle length  Time 4   Period Weeks   Status New     PT SHORT TERM GOAL #3   Title able to perform initial HEP correctly for improved bowel control   Time 4   Period Weeks           PT Long Term Goals - 05/25/16 1042      PT LONG TERM GOAL #1   Title FOTO < or = to 39% limited on bowel leakage survey   Time 8   Period Weeks   Status New     PT LONG TERM GOAL #2   Title pt independent with advanced HEP   Time 8   Period Weeks   Status New     PT LONG TERM GOAL #3   Title able to hold pelvic floor contraction of 2/5 for 10 seconds due to improved muscle function and endurance   Time 8   Period Weeks   Status New     PT LONG TERM GOAL #4   Title Pt demonstrates improved bowel control with no bowel leakage for 2 weeks   Time 8     PT LONG TERM GOAL #5   Title reports stool is quarter size diameter consistently due to increased abilty for anal sphincter muscles to relax   Time 8   Period Weeks   Status New               Plan - 05/25/16 1023    Clinical Impression Statement Patient presents to clinic s/p PARTIAL PROCTECTOMY. She has been experiencing fecal incontinence on average 2 days/week and sometimes up to 10x/day. Pt has scoliosis, history of surgery, history of cancer. She has hip weakness from 3+/5 and pelvic floor MMT 2/5. Pt has some fascial and scar adhesions in abdominals with core weakness. Pt has muscle spasms in pelvic floor. Pt will benefit from skilled PT to address these impairments in order to improve function and self care activities. Moderate complex evaluation due to comorbities mentioned, addressing pelvis, hips, lumbar spine, and evolving condition with moderate complex decision making.    Rehab Potential Excellent   Clinical Impairments Affecting Rehab Potential scoliosis, history of surgery, history of cancer   PT Frequency 2x / week   PT Duration 8 weeks   PT Treatment/Interventions ADLs/Self Care Home Management;Biofeedback;Cryotherapy;Electrical Stimulation;Moist Heat;Ultrasound;Neuromuscular re-education;Therapeutic activities;Therapeutic exercise;Patient/family education;Manual techniques;Taping;Dry needling;Scar mobilization   PT Next Visit Plan toilet techniques, urge to void, core and pelvic floor strengthening   Recommended Other Services n/a   Consulted and Agree with Plan of Care Patient      Patient will benefit from skilled therapeutic intervention in order to improve the following deficits and impairments:  Decreased strength, Decreased coordination, Increased muscle spasms, Increased fascial restricitons, Postural dysfunction  Visit Diagnosis: Muscle weakness (generalized)  Unspecified lack of coordination     Problem List Patient Active Problem List   Diagnosis Date Noted  . Family history of colon cancer   . Family history of breast cancer   . Family history of prostate cancer   . Adenomatous rectal polyp s/p TEM resection 02/29/2016 02/29/2016  . Pelvic pain 05/11/2011  . Polyuria 05/11/2011  . Bowel habit changes 05/11/2011  . Heartburn 02/06/2011  . Postoperative anemia 01/12/2011  . Insomnia 12/15/2010  . Rectal cancer, T1N0 s/p LAR 12/02/2010 11/01/2010  . Family history of malignant neoplasm of gastrointestinal tract 09/02/2010    Zannie Cove, PT 05/25/2016, 11:20 PM  [facility] [address]  [place], [place], [place], [address] Phone: [phone]   Fax:  [phone]  Name: Natasha Campbell MRN: [id number] Date of Birth: [birth year]

## 2016-06-01 ENCOUNTER — Encounter: Payer: BLUE CROSS/BLUE SHIELD | Admitting: Physical Therapy

## 2016-06-06 ENCOUNTER — Encounter: Payer: Self-pay | Admitting: Physical Therapy

## 2016-06-06 ENCOUNTER — Ambulatory Visit: Payer: BLUE CROSS/BLUE SHIELD | Attending: Surgery | Admitting: Physical Therapy

## 2016-06-06 DIAGNOSIS — M6281 Muscle weakness (generalized): Secondary | ICD-10-CM | POA: Diagnosis not present

## 2016-06-06 DIAGNOSIS — R279 Unspecified lack of coordination: Secondary | ICD-10-CM | POA: Diagnosis present

## 2016-06-06 NOTE — Patient Instructions (Signed)
   Squeeze ball and lift pelvic floor muscles like trying to not pass gas and stopping the flow of urine - hold 1 sec, repeat 10x or until you feel the contraction weaken.  You can squeeze in sitting or standing as you get better at it When standing up, squeeze muscles before standing, then squeeze and hold until you sit back down.  Fully relax muscle between each contraction.    Repeat 2x 10  Relaxation Exercises with the Urge to Void   When you experience an urge to void:  FIRST  Stop and stand very still    Sit down if you can    Don't move    You need to stay very still to maintain control  SECOND Squeeze your pelvic floor muscles 5 times, like a quick flick, to keep from leaking  THIRD Relax  Take a deep breath and then let it out  Try to make the urge go away by using relaxation and visualization techniques  FINALLY When you feel the urge go away somewhat, walk normally to the bathroom.   If the urge gets suddenly stronger on the way, you may stop again and relax to regain control.    Other resources  Local resources:  [facility] (free PT clinic at [facility]) Department of [facility] [address] [place], [place] [address] Phone: [phone] Email: [email or web address] Website: [email or web address]  [employer] Prep Program ($100 for 12 week guided exercise program at [facility] on [place]): [place], Wellness RN Phone: [phone] Email: [email or web address]  Therapeutic Pilates (for low back pain):  Contact Raeford Razor, PT Phone: [phone]  Cancer related information: Celebrate the trail to recovery (CRT):  Tool to help you battle cancer or adjust to your "new normal" Opportunities to explore hiking on the [place] trail systems while experiencing restorative properties of nature.   Contact Lanae Crumbly, PhD Email: [email or web address]  Phone: [phone]  Internet resources:  Pelvic floor based  information: Femme fusion fitness - YouTube Shelly Prosko - YouTube [email or web address]  [email or web address]   Books:  Heal Pelvic Pain: The Proven Stretching, Strengthening, and Nutrition Program for Relieving Pain, Incontinence, I.B.S, and Other Symptoms Without Surgery by Alford Highland  Pelvic Pain Explained: What you need to know by Colletta Maryland A. Emi Holes  Sex without pain: A self-treatment guide to the sex life you deserve by Zigmund Gottron , DPT A Headache in the Pelvis, a New, Revised, Expanded and Updated 6th Edition: A New Understanding and Treatment for Chronic Pelvic Pain Syndromes by Gay Filler, Rae Mar  Pain relief tools:  At home TENS units - [place], CMT, Vaginismus

## 2016-06-06 NOTE — Therapy (Signed)
[facility] [address] [place], [place], [address] Phone: [phone]   Fax:  [phone]  Physical Therapy Treatment  Patient Details  Name: Natasha Campbell MRN: [id number] Date of Birth: 20-Mar-1950 Referring Provider: Michael Boston, MD  Encounter Date: 06/06/2016      PT End of Session - 06/06/16 1032    Visit Number 2   Date for PT Re-Evaluation 07/20/16   PT Start Time 0935   PT Stop Time 1016   PT Time Calculation (min) 41 min   Activity Tolerance Patient tolerated treatment well      Past Medical History:  Diagnosis Date  . Arthritis    hands  . Blood in stool    history  . Blood transfusion without reported diagnosis    self reported  . Chronic headaches   . Chronic kidney disease 2014   20% kidney function failure per patient    . Family history of breast cancer   . Family history of colon cancer   . Family history of malignant neoplasm of gastrointestinal tract   . Family history of prostate cancer   . Headache(784.0)    migraines  . Insomnia   . Rectal cancer, T1N0 s/p LAR 12/02/2010 11/01/2010    Past Surgical History:  Procedure Laterality Date  . COLON RESECTION  12/02/2010   Procedure: COLON RESECTION LAPAROSCOPIC;  Surgeon: Adin Hector, MD;  Location: [facility];  Service: General;  Laterality: N/A;  Laparoscopic Low Anterior Resection, Rigid Proctoscopy  . COLONOSCOPY     multiple d/t rectal cancer dx  . PARTIAL PROCTECTOMY BY TEM N/A 02/29/2016   Procedure: PARTIAL PROCTECTOMY BY TEM OF RECTAL MASS ERAS PATHWAY;  Surgeon: Michael Boston, MD;  Location: [facility];  Service: General;  Laterality: N/A;  . TONSILLECTOMY AND ADENOIDECTOMY     age 71    There were no vitals filed for this visit.      Subjective Assessment - 06/06/16 1030    Subjective I have been having incontinence because of eating more fruit.  I am not able to come to as many treatments due to all the medical bills  lately so if we can do this as efficiently as possible that would be great.   Pertinent History scoliosis, history of surgery, history of cancer   Limitations Walking   Patient Stated Goals improve bowel and bladder function, no incontinence   Currently in Pain? No/denies                         [facility] Adult PT Treatment/Exercise - 06/06/16 0001      Self-Care   Other Self-Care Comments  urge to void and outside resources     Neuro Re-ed    Neuro Re-ed Details  pelvic floor contraction with tactile feedback internally, ball squeeze, breathing technique     Manual Therapy   Manual Therapy Internal Pelvic Floor   Manual therapy comments pt informed and consent given to perform    Internal Pelvic Floor levators, coccygeus, obdurator                PT Education - 06/06/16 1025    Education provided Yes   Education Details ball squeeze, urge to void, outside resources   Person(s) Educated Patient   Methods Explanation;Demonstration;Tactile cues;Verbal cues;Handout   Comprehension Verbalized understanding;Returned demonstration          PT Short Term Goals - 06/06/16 1451  PT SHORT TERM GOAL #1   Title improved consistency of bowel movements due to regular meals, fiber, and abdominal massage techniques   Time 4   Period Weeks   Status On-going     PT SHORT TERM GOAL #2   Title able to bulge pelvic floor due to increased muscle length   Time 4   Period Weeks   Status On-going     PT SHORT TERM GOAL #3   Title able to perform initial HEP correctly for improved bowel control   Time 4   Period Weeks   Status On-going           PT Long Term Goals - 05/25/16 1042      PT LONG TERM GOAL #1   Title FOTO < or = to 39% limited on bowel leakage survey   Time 8   Period Weeks   Status New     PT LONG TERM GOAL #2   Title pt independent with advanced HEP   Time 8   Period Weeks   Status New     PT LONG TERM GOAL #3   Title able to hold  pelvic floor contraction of 2/5 for 10 seconds due to improved muscle function and endurance   Time 8   Period Weeks   Status New     PT LONG TERM GOAL #4   Title Pt demonstrates improved bowel control with no bowel leakage for 2 weeks   Time 8     PT LONG TERM GOAL #5   Title reports stool is quarter size diameter consistently due to increased abilty for anal sphincter muscles to relax   Time 8   Period Weeks   Status New               Plan - 06/06/16 1332    Clinical Impression Statement Patient had difficulty maintaining contraction more than one second.  She is still incontinent with feces and has increased with increased fruit consumption.  Pt was able to perform exercises as seen in HEP.  Pt was given outside resources in order to familiarize self to some online exercise programs due to the fact that she will not want to come to PT as much due to finances.  Pt needs skilled PT to increase strength in order to maintain bowel control.   Clinical Impairments Affecting Rehab Potential scoliosis, history of surgery, history of cancer   PT Next Visit Plan follow up on exercises, biofeedback if needed, toilet techniques, progress core, glute and pelvic floor strength   Consulted and Agree with Plan of Care Patient      Patient will benefit from skilled therapeutic intervention in order to improve the following deficits and impairments:  Decreased strength, Decreased coordination, Increased muscle spasms, Increased fascial restricitons, Postural dysfunction  Visit Diagnosis: Muscle weakness (generalized)  Unspecified lack of coordination     Problem List Patient Active Problem List   Diagnosis Date Noted  . Family history of colon cancer   . Family history of breast cancer   . Family history of prostate cancer   . Adenomatous rectal polyp s/p TEM resection 02/29/2016 02/29/2016  . Pelvic pain 05/11/2011  . Polyuria 05/11/2011  . Bowel habit changes 05/11/2011  .  Heartburn 02/06/2011  . Postoperative anemia 01/12/2011  . Insomnia 12/15/2010  . Rectal cancer, T1N0 s/p LAR 12/02/2010 11/01/2010  . Family history of malignant neoplasm of gastrointestinal tract 09/02/2010    Zannie Cove, PT 06/06/2016, 2:51 PM  [facility] [address] [place], [place], [address] Phone: [phone]   Fax:  [phone]  Name: Natasha Campbell MRN: [id number] Date of Birth: 10-Jun-1950

## 2016-06-08 ENCOUNTER — Encounter: Payer: BLUE CROSS/BLUE SHIELD | Admitting: Physical Therapy

## 2016-06-12 ENCOUNTER — Encounter: Payer: BLUE CROSS/BLUE SHIELD | Admitting: Physical Therapy

## 2016-06-15 ENCOUNTER — Encounter: Payer: Self-pay | Admitting: Physical Therapy

## 2016-06-15 ENCOUNTER — Ambulatory Visit: Payer: BLUE CROSS/BLUE SHIELD | Admitting: Physical Therapy

## 2016-06-15 DIAGNOSIS — M6281 Muscle weakness (generalized): Secondary | ICD-10-CM | POA: Diagnosis not present

## 2016-06-15 DIAGNOSIS — R279 Unspecified lack of coordination: Secondary | ICD-10-CM

## 2016-06-15 NOTE — Patient Instructions (Signed)
Piriformis Stretch, Sitting    Sit, one ankle on opposite knee, same-side hand on crossed knee. Push down on knee, keeping spine straight. Lean torso forward, with flat back, until tension is felt in hamstrings and gluteals of crossed-leg side. Hold ___ seconds.  Repeat ___ times per session. Do ___ sessions per day.  Copyright  VHI. All rights reserved.     Cobra  Push self up with elbows under shoulder, keep palms flat. Keep shoulders back (squeeze). 3 deep breaths or about 10 seconds Repeat 5 times  Cat / Cow Flow    Inhale, press spine toward ceiling like a Halloween cat. Keeping strength in arms and abdominals, exhale to soften spine through neutral and into cow pose. Open chest and arch back. Initiate movement between cat and cow at tailbone, one vertebrae at a time.  Inhale as you extend spine and stretch hips, exhale as you arch back up and draw belly button towards spine and squeeze pelvic floor.   Repeat __10__ times.  Copyright  VHI. All rights reserved.

## 2016-06-15 NOTE — Therapy (Signed)
[facility] [address] [place], [place], [address] Phone: [phone]   Fax:  [phone]  Physical Therapy Treatment  Patient Details  Name: Natasha Campbell MRN: [id number] Date of Birth: [birth year] Referring Provider: Michael Boston, MD  Encounter Date: 06/15/2016      PT End of Session - 06/15/16 0930    Visit Number 3   Date for PT Re-Evaluation 07/20/16   PT Start Time 0930   PT Stop Time 1011   PT Time Calculation (min) 41 min   Activity Tolerance Patient tolerated treatment well   Behavior During Therapy [facility] for tasks assessed/performed      Past Medical History:  Diagnosis Date  . Arthritis    hands  . Blood in stool    history  . Blood transfusion without reported diagnosis    self reported  . Chronic headaches   . Chronic kidney disease 2014   20% kidney function failure per patient    . Family history of breast cancer   . Family history of colon cancer   . Family history of malignant neoplasm of gastrointestinal tract   . Family history of prostate cancer   . Headache(784.0)    migraines  . Insomnia   . Rectal cancer, T1N0 s/p LAR 12/02/2010 11/01/2010    Past Surgical History:  Procedure Laterality Date  . COLON RESECTION  12/02/2010   Procedure: COLON RESECTION LAPAROSCOPIC;  Surgeon: Adin Hector, MD;  Location: [facility];  Service: General;  Laterality: N/A;  Laparoscopic Low Anterior Resection, Rigid Proctoscopy  . COLONOSCOPY     multiple d/t rectal cancer dx  . PARTIAL PROCTECTOMY BY TEM N/A 02/29/2016   Procedure: PARTIAL PROCTECTOMY BY TEM OF RECTAL MASS ERAS PATHWAY;  Surgeon: Michael Boston, MD;  Location: [facility];  Service: General;  Laterality: N/A;  . TONSILLECTOMY AND ADENOIDECTOMY     age 66    There were no vitals filed for this visit.      Subjective Assessment - 06/15/16 0932    Subjective I was doing well until yesterday.  I have been exercising but not very  consistently. It seemed like a few days after the last session were the best it had been in a while.   Pertinent History scoliosis, history of surgery, history of cancer   Patient Stated Goals improve bowel and bladder function, no incontinence   Currently in Pain? No/denies                         [facility] Adult PT Treatment/Exercise - 06/15/16 0001      Neuro Re-ed    Neuro Re-ed Details  pelvic floor bulging in sitting and with stretches, lifting with tactile feedback for improved muslce coordination     Exercises   Exercises Lumbar     Lumbar Exercises: Stretches   Prone on Elbows Stretch 5 reps;10 seconds   Piriformis Stretch 3 reps;20 seconds  sitting and supine     Lumbar Exercises: Seated   Other Seated Lumbar Exercises on physioball - tactile feedback with bulging pelvic floor     Lumbar Exercises: Sidelying   Other Sidelying Lumbar Exercises pelvic floor contraction 7x 5 sec hold with tactile cues     Lumbar Exercises: Quadruped   [place] 10 reps  with breathing and pelvic floor relaxation     Manual Therapy   Manual Therapy Internal Pelvic Floor   Manual therapy comments pt informed  and consent given to perform    Internal Pelvic Floor levators, coccygeus, obdurator                  PT Short Term Goals - 06/15/16 1121      PT SHORT TERM GOAL #1   Title improved consistency of bowel movements due to regular meals, fiber, and abdominal massage techniques   Time 4   Period Weeks   Status Achieved     PT SHORT TERM GOAL #2   Title able to bulge pelvic floor due to increased muscle length   Time 4   Period Weeks   Status On-going     PT SHORT TERM GOAL #3   Title able to perform initial HEP correctly for improved bowel control   Time 4   Period Weeks   Status Achieved           PT Long Term Goals - 06/15/16 1122      PT LONG TERM GOAL #1   Title FOTO < or = to 39% limited on bowel leakage survey   Time 8   Period  Weeks   Status On-going     PT LONG TERM GOAL #2   Title pt independent with advanced HEP   Time 8   Period Weeks   Status On-going     PT LONG TERM GOAL #3   Title able to hold pelvic floor contraction of 2/5 for 10 seconds due to improved muscle function and endurance   Baseline 5 sec   Time 8   Period Weeks   Status On-going     PT LONG TERM GOAL #4   Title Pt demonstrates improved bowel control with no bowel leakage for 2 weeks   Time 8   Period Weeks   Status On-going     PT LONG TERM GOAL #5   Title reports stool is quarter size diameter consistently due to increased abilty for anal sphincter muscles to relax   Time 8   Period Weeks   Status On-going               Plan - 06/15/16 0931    Clinical Impression Statement Patient had increased muscle length and muscles released more quickly today than previous session.  Pt able to maintain contraction for 5 sec for 7 reps and demonstrates improved muscle coordination.  She continues to need skilled PT due to increased muscle spasms and decreased strength and endurance.   Clinical Impairments Affecting Rehab Potential scoliosis, history of surgery, history of cancer   PT Treatment/Interventions ADLs/Self Care Home Management;Biofeedback;Cryotherapy;Electrical Stimulation;Moist Heat;Ultrasound;Neuromuscular re-education;Therapeutic activities;Therapeutic exercise;Patient/family education;Manual techniques;Taping;Dry needling;Scar mobilization   PT Next Visit Plan biofeedback   Consulted and Agree with Plan of Care Patient      Patient will benefit from skilled therapeutic intervention in order to improve the following deficits and impairments:  Decreased strength, Decreased coordination, Increased muscle spasms, Increased fascial restricitons, Postural dysfunction  Visit Diagnosis: Muscle weakness (generalized)  Unspecified lack of coordination     Problem List Patient Active Problem List   Diagnosis Date  Noted  . Family history of colon cancer   . Family history of breast cancer   . Family history of prostate cancer   . Adenomatous rectal polyp s/p TEM resection 02/29/2016 02/29/2016  . Pelvic pain 05/11/2011  . Polyuria 05/11/2011  . Bowel habit changes 05/11/2011  . Heartburn 02/06/2011  . Postoperative anemia 01/12/2011  . Insomnia 12/15/2010  . Rectal cancer, T1N0  s/p LAR 12/02/2010 11/01/2010  . Family history of malignant neoplasm of gastrointestinal tract 09/02/2010    Zannie Cove, PT 06/15/2016, 11:23 AM  [facility] [address], [place], [place], [address] Phone: [phone]   Fax:  [phone]  Name: AMENAH TUCCI MRN: [id number] Date of Birth: Nov 30, 1950

## 2016-06-19 ENCOUNTER — Encounter: Payer: BLUE CROSS/BLUE SHIELD | Admitting: Physical Therapy

## 2016-06-22 ENCOUNTER — Encounter: Payer: BLUE CROSS/BLUE SHIELD | Admitting: Physical Therapy

## 2016-06-26 ENCOUNTER — Ambulatory Visit: Payer: BLUE CROSS/BLUE SHIELD | Admitting: Physical Therapy

## 2016-06-26 DIAGNOSIS — R279 Unspecified lack of coordination: Secondary | ICD-10-CM

## 2016-06-26 DIAGNOSIS — M6281 Muscle weakness (generalized): Secondary | ICD-10-CM | POA: Diagnosis not present

## 2016-06-26 NOTE — Patient Instructions (Signed)
Toileting Techniques for Bowel Movements (Defecation) Using your belly (abdomen) and pelvic floor muscles to have a bowel movement is usually instinctive.  Sometimes people can have problems with these muscles and have to relearn proper defecation (emptying) techniques.  If you have weakness in your muscles, organs that are falling out, decreased sensation in your pelvis, or ignore your urge to go, you may find yourself straining to have a bowel movement.  You are straining if you are: . holding your breath or taking in a huge gulp of air and holding it  . keeping your lips and jaw tensed and closed tightly . turning red in the face because of excessive pushing or forcing . developing or worsening your  hemorrhoids . getting faint while pushing . not emptying completely and have to defecate many times a day  If you are straining, you are actually making it harder for yourself to have a bowel movement.  Many people find they are pulling up with the pelvic floor muscles and closing off instead of opening the anus. Due to lack pelvic floor relaxation and coordination the abdominal muscles, one has to work harder to push the feces out.  Many people have never been taught how to defecate efficiently and effectively.  Notice what happens to your body when you are having a bowel movement.  While you are sitting on the toilet pay attention to the following areas: . Jaw and mouth position . Angle of your hips   . Whether your feet touch the ground or not . Arm placement  . Spine position . Waist . Belly tension . Anus (opening of the anal canal)  An Evacuation/Defecation Plan   Here are the 4 basic points:  1. Lean forward enough for your elbows to rest on your knees 2. Support your feet on the floor or use a low stool if your feet don't touch the floor  3. Push out your belly as if you have swallowed a beach ball-you should feel a widening of your waist 4. Open and relax your pelvic floor  muscles, rather than tightening around the anus      The following conditions my require modifications to your toileting posture:  . If you have had surgery in the past that limits your back, hip, pelvic, knee or ankle flexibility . Constipation   Your healthcare practitioner may make the following additional suggestions and adjustments:  1) Sit on the toilet  a) Make sure your feet are supported. b) Notice your hip angle and spine position-most people find it effective to lean forward or raise their knees, which can help the muscles around the anus to relax  c) When you lean forward, place your forearms on your thighs for support  2) Relax suggestions a) Breath deeply in through your nose and out slowly through your mouth as if you are smelling the flowers and blowing out the candles. b) To become aware of how to relax your muscles, contracting and releasing muscles can be helpful.  Pull your pelvic floor muscles in tightly by using the image of holding back gas, or closing around the anus (visualize making a circle smaller) and lifting the anus up and in.  Then release the muscles and your anus should drop down and feel open. Repeat 5 times ending with the feeling of relaxation. c) Keep your pelvic floor muscles relaxed; let your belly bulge out. d) The digestive tract starts at the mouth and ends at the anal opening, so  be sure to relax both ends of the tube.  Place your tongue on the roof of your mouth with your teeth separated.  This helps relax your mouth and will help to relax the anus at the same time.  3) Empty (defecation) a) Keep your pelvic floor and sphincter relaxed, then bulge your anal muscles.  Make the anal opening wide.  b) Stick your belly out as if you have swallowed a beach ball. c) Make your belly wall hard using your belly muscles while continuing to breathe. Doing this makes it easier to open your anus. d) Breath out and give a grunt (or try using other sounds such  as ahhhh, shhhhh, ohhhh or grrrrrrr).  4) Finish a) As you finish your bowel movement, pull the pelvic floor muscles up and in.  This will leave your anus in the proper place rather than remaining pushed out and down. If you leave your anus pushed out and down, it will start to feel as though that is normal and give you incorrect signals about needing to have a bowel movement.   [facility] [place], [place] [address]  Try starting the day with cup of hot/warm water with lemon Abdominal massage

## 2016-06-26 NOTE — Therapy (Signed)
[facility] [address] [place], [place], [address] Phone: [phone]   Fax:  [phone]  Physical Therapy Treatment  Patient Details  Name: Natasha Campbell MRN: [id number] Date of Birth: 1950/04/04 Referring Provider: Michael Boston, MD  Encounter Date: 06/26/2016      PT End of Session - 06/26/16 0948    Visit Number 4   Date for PT Re-Evaluation 07/20/16   PT Start Time 0846   PT Stop Time 0935   PT Time Calculation (min) 49 min   Activity Tolerance Patient tolerated treatment well   Behavior During Therapy [facility] for tasks assessed/performed      Past Medical History:  Diagnosis Date  . Arthritis    hands  . Blood in stool    history  . Blood transfusion without reported diagnosis    self reported  . Chronic headaches   . Chronic kidney disease 2014   20% kidney function failure per patient    . Family history of breast cancer   . Family history of colon cancer   . Family history of malignant neoplasm of gastrointestinal tract   . Family history of prostate cancer   . Headache(784.0)    migraines  . Insomnia   . Rectal cancer, T1N0 s/p LAR 12/02/2010 11/01/2010    Past Surgical History:  Procedure Laterality Date  . COLON RESECTION  12/02/2010   Procedure: COLON RESECTION LAPAROSCOPIC;  Surgeon: Adin Hector, MD;  Location: [facility];  Service: General;  Laterality: N/A;  Laparoscopic Low Anterior Resection, Rigid Proctoscopy  . COLONOSCOPY     multiple d/t rectal cancer dx  . PARTIAL PROCTECTOMY BY TEM N/A 02/29/2016   Procedure: PARTIAL PROCTECTOMY BY TEM OF RECTAL MASS ERAS PATHWAY;  Surgeon: Michael Boston, MD;  Location: [facility];  Service: General;  Laterality: N/A;  . TONSILLECTOMY AND ADENOIDECTOMY     age 89    There were no vitals filed for this visit.      Subjective Assessment - 06/26/16 0955    Subjective I have been having more trouble emptying my bowels and that's when things seem  to leak out.  Pt had small fecal leakage noticed prior to biofeedback   Pertinent History scoliosis, history of surgery, history of cancer   Limitations Walking   Patient Stated Goals improve bowel and bladder function, no incontinence   Currently in Pain? No/denies                         [facility] Adult PT Treatment/Exercise - 06/26/16 0001      Self-Care   Other Self-Care Comments  toileting techniques and abdominal massage education during manual theraapy     Neuro Re-ed    Neuro Re-ed Details  resting 22.17mV; quick contract 61mV; 10 sec hold contract 21.3mV; 20 second contract21.mV; post exercise resting tone 4mV  contract and hold 3 sec for exercise     Manual Therapy   Manual Therapy Internal Pelvic Floor   Manual therapy comments pt informed and consent given to perform    Internal Pelvic Floor levators, coccygeus, obdurator                PT Education - 06/26/16 0949    Education provided Yes   Education Details toilet techniques, reviewed abdominal massage   Person(s) Educated Patient   Methods Explanation;Handout   Comprehension Verbalized understanding          PT Short  Term Goals - 06/15/16 1121      PT SHORT TERM GOAL #1   Title improved consistency of bowel movements due to regular meals, fiber, and abdominal massage techniques   Time 4   Period Weeks   Status Achieved     PT SHORT TERM GOAL #2   Title able to bulge pelvic floor due to increased muscle length   Time 4   Period Weeks   Status On-going     PT SHORT TERM GOAL #3   Title able to perform initial HEP correctly for improved bowel control   Time 4   Period Weeks   Status Achieved           PT Long Term Goals - 06/26/16 0947      PT LONG TERM GOAL #1   Title FOTO < or = to 39% limited on bowel leakage survey   Time 8   Period Weeks   Status On-going     PT LONG TERM GOAL #2   Title pt independent with advanced HEP   Time 8   Period Weeks   Status  On-going     PT LONG TERM GOAL #3   Title able to hold pelvic floor contraction of 2/5 for 10 seconds due to improved muscle function and endurance   Time 8   Period Weeks   Status On-going     PT LONG TERM GOAL #4   Title Pt demonstrates improved bowel control with no bowel leakage for 2 weeks   Time 8   Period Weeks   Status On-going     PT LONG TERM GOAL #5   Title reports stool is quarter size diameter consistently due to increased abilty for anal sphincter muscles to relax   Time 8   Period Weeks   Status On-going               Plan - 06/26/16 0940    Clinical Impression Statement Patient has elevated tone in pelvic floor with 38mV resting tone.  Tone decreased with exercises, but only able to sustain contraction for 2-3 sec at a time.  Pt has tight obdurator and coccygeus muscle.  Pt will benefit from skilled PT to improve strength and muscle coordination   Rehab Potential Excellent   Clinical Impairments Affecting Rehab Potential scoliosis, history of surgery, history of cancer   PT Treatment/Interventions ADLs/Self Care Home Management;Biofeedback;Cryotherapy;Electrical Stimulation;Moist Heat;Ultrasound;Neuromuscular re-education;Therapeutic activities;Therapeutic exercise;Patient/family education;Manual techniques;Taping;Dry needling;Scar mobilization   PT Next Visit Plan STM internal, abdominal massage myofascial release, pelvic floor strengthening   Consulted and Agree with Plan of Care Patient      Patient will benefit from skilled therapeutic intervention in order to improve the following deficits and impairments:  Decreased strength, Decreased coordination, Increased muscle spasms, Increased fascial restricitons, Postural dysfunction  Visit Diagnosis: Muscle weakness (generalized)  Unspecified lack of coordination     Problem List Patient Active Problem List   Diagnosis Date Noted  . Family history of colon cancer   . Family history of breast cancer    . Family history of prostate cancer   . Adenomatous rectal polyp s/p TEM resection 02/29/2016 02/29/2016  . Pelvic pain 05/11/2011  . Polyuria 05/11/2011  . Bowel habit changes 05/11/2011  . Heartburn 02/06/2011  . Postoperative anemia 01/12/2011  . Insomnia 12/15/2010  . Rectal cancer, T1N0 s/p LAR 12/02/2010 11/01/2010  . Family history of malignant neoplasm of gastrointestinal tract 09/02/2010    Zannie Cove, PT 06/26/2016, 9:56 AM  [facility] [address] [place], [place], [address] Phone: [phone]   Fax:  [phone]  Name: Natasha Campbell MRN: [id number] Date of Birth: 05-01-1950

## 2016-06-29 ENCOUNTER — Ambulatory Visit: Payer: BLUE CROSS/BLUE SHIELD | Admitting: Physical Therapy

## 2016-07-03 ENCOUNTER — Encounter: Payer: BLUE CROSS/BLUE SHIELD | Admitting: Physical Therapy

## 2016-07-06 ENCOUNTER — Encounter: Payer: BLUE CROSS/BLUE SHIELD | Admitting: Physical Therapy

## 2016-07-10 ENCOUNTER — Encounter: Payer: BLUE CROSS/BLUE SHIELD | Admitting: Physical Therapy

## 2016-07-13 ENCOUNTER — Ambulatory Visit: Payer: BLUE CROSS/BLUE SHIELD | Attending: Surgery | Admitting: Physical Therapy

## 2016-07-13 DIAGNOSIS — M6281 Muscle weakness (generalized): Secondary | ICD-10-CM | POA: Diagnosis present

## 2016-07-13 DIAGNOSIS — R279 Unspecified lack of coordination: Secondary | ICD-10-CM | POA: Insufficient documentation

## 2016-07-13 NOTE — Patient Instructions (Signed)
   ELASTIC BAND - SIDELYING CLAM-   While lying on your side with your knees bent and an elastic band wrapped around your knees, draw up the top knee while keeping contact of your feet together as shown.   Do not let your pelvis roll back during the lifting movement.   20x each side    MEDICINE BALL BRIDGE  While lying on your back, raise your buttocks off the floor/bed while holding a medicine ball between your knees as shown.  Squeeze anus and hold as you lift up, back down and relax before squeezing again  Repeat 2 sets of 10    Squeeze ball and anus hold 4 sec - 3 sets of 5    Squeeze anus before standing then relax, then squeeze while sitting 10 reps

## 2016-07-13 NOTE — Therapy (Signed)
[facility] [address] [place], [place], [address] Phone: [phone]   Fax:  [phone]  Physical Therapy Treatment  Patient Details  Name: Natasha Campbell MRN: [id number] Date of Birth: 1950-11-03 Referring Provider: Michael Boston, MD  Encounter Date: 07/13/2016      PT End of Session - 07/13/16 1014    Visit Number 5   Date for PT Re-Evaluation 07/20/16   PT Start Time 0932   PT Stop Time 1014   PT Time Calculation (min) 42 min   Activity Tolerance Patient tolerated treatment well   Behavior During Therapy [facility] for tasks assessed/performed      Past Medical History:  Diagnosis Date  . Arthritis    hands  . Blood in stool    history  . Blood transfusion without reported diagnosis    self reported  . Chronic headaches   . Chronic kidney disease 2014   20% kidney function failure per patient    . Family history of breast cancer   . Family history of colon cancer   . Family history of malignant neoplasm of gastrointestinal tract   . Family history of prostate cancer   . Headache(784.0)    migraines  . Insomnia   . Rectal cancer, T1N0 s/p LAR 12/02/2010 11/01/2010    Past Surgical History:  Procedure Laterality Date  . COLON RESECTION  12/02/2010   Procedure: COLON RESECTION LAPAROSCOPIC;  Surgeon: Adin Hector, MD;  Location: [facility];  Service: General;  Laterality: N/A;  Laparoscopic Low Anterior Resection, Rigid Proctoscopy  . COLONOSCOPY     multiple d/t rectal cancer dx  . PARTIAL PROCTECTOMY BY TEM N/A 02/29/2016   Procedure: PARTIAL PROCTECTOMY BY TEM OF RECTAL MASS ERAS PATHWAY;  Surgeon: Michael Boston, MD;  Location: [facility];  Service: General;  Laterality: N/A;  . TONSILLECTOMY AND ADENOIDECTOMY     age 66    There were no vitals filed for this visit.      Subjective Assessment - 07/13/16 0934    Subjective I will have little bits and then have bigger bowel movements like it has built  up.  It feels like I am leaking less right after having a big bowel movement but the big ones cause me to go very often throughout the day.   Pertinent History scoliosis, history of surgery, history of cancer   Patient Stated Goals improve bowel and bladder function, no incontinence   Currently in Pain? No/denies                         [facility] Adult PT Treatment/Exercise - 07/13/16 0001      Neuro Re-ed    Neuro Re-ed Details  sit to stand with contractions, tactile anc verbal cues to contract pelvic floor  holding 4 seconds 5 x, needs tactile cues to relax between sets, ball squeeze, bridge and clams with contract relax of pelvic floor.     Manual Therapy   Manual Therapy Internal Pelvic Floor   Manual therapy comments pt informed and consent given to perform    Internal Pelvic Floor levators, coccygeus, obdurator                PT Education - 07/13/16 1020    Education provided Yes   Education Details ball squeeze, bridge, clam, sit to stand   Person(s) Educated Patient   Methods Explanation;Handout;Verbal cues;Tactile cues;Demonstration   Comprehension Verbalized understanding;Returned demonstration  PT Short Term Goals - 06/15/16 1121      PT SHORT TERM GOAL #1   Title improved consistency of bowel movements due to regular meals, fiber, and abdominal massage techniques   Time 4   Period Weeks   Status Achieved     PT SHORT TERM GOAL #2   Title able to bulge pelvic floor due to increased muscle length   Time 4   Period Weeks   Status On-going     PT SHORT TERM GOAL #3   Title able to perform initial HEP correctly for improved bowel control   Time 4   Period Weeks   Status Achieved           PT Long Term Goals - 07/13/16 0630      PT LONG TERM GOAL #1   Title FOTO < or = to 39% limited on bowel leakage survey   Period Weeks   Status On-going     PT LONG TERM GOAL #3   Title able to hold pelvic floor contraction of 2/5 for  10 seconds due to improved muscle function and endurance   Time 8   Period Weeks   Status On-going     PT LONG TERM GOAL #4   Title Pt demonstrates improved bowel control with no bowel leakage for 2 weeks   Time 8   Period Weeks   Status On-going     PT LONG TERM GOAL #5   Title reports stool is quarter size diameter consistently due to increased abilty for anal sphincter muscles to relax   Time 8   Period Weeks   Status On-going               Plan - 07/13/16 1020    Clinical Impression Statement Patient able to hold contraction a little longer but has difficulty relaxing after.  Pt was a little more tense in pelvic floor today and had stool so she said she was having trouble relaxing due to that.  She will continue to need skilled PT to work on improved muscle coordination during functional movements for improved bowel control.   Clinical Impairments Affecting Rehab Potential scoliosis, history of surgery, history of cancer   PT Treatment/Interventions ADLs/Self Care Home Management;Biofeedback;Cryotherapy;Electrical Stimulation;Moist Heat;Ultrasound;Neuromuscular re-education;Therapeutic activities;Therapeutic exercise;Patient/family education;Manual techniques;Taping;Dry needling;Scar mobilization   PT Next Visit Plan review exercises, f/u with fiber intake, STM internal, abdominal massage myofascial release, pelvic floor strengthening in standing   Consulted and Agree with Plan of Care Patient      Patient will benefit from skilled therapeutic intervention in order to improve the following deficits and impairments:  Decreased strength, Decreased coordination, Increased muscle spasms, Increased fascial restricitons, Postural dysfunction  Visit Diagnosis: Muscle weakness (generalized)  Unspecified lack of coordination     Problem List Patient Active Problem List   Diagnosis Date Noted  . Family history of colon cancer   . Family history of breast cancer   . Family  history of prostate cancer   . Adenomatous rectal polyp s/p TEM resection 02/29/2016 02/29/2016  . Pelvic pain 05/11/2011  . Polyuria 05/11/2011  . Bowel habit changes 05/11/2011  . Heartburn 02/06/2011  . Postoperative anemia 01/12/2011  . Insomnia 12/15/2010  . Rectal cancer, T1N0 s/p LAR 12/02/2010 11/01/2010  . Family history of malignant neoplasm of gastrointestinal tract 09/02/2010    Zannie Cove, PT 07/13/2016, 10:24 AM  [facility] [address] [place], [place], [address] Phone: [phone]  Fax:  [phone]  Name: KIEREN RICCI MRN: [id number] Date of Birth: [birth year]

## 2016-07-25 ENCOUNTER — Ambulatory Visit: Payer: BLUE CROSS/BLUE SHIELD | Admitting: Physical Therapy

## 2016-07-25 DIAGNOSIS — M6281 Muscle weakness (generalized): Secondary | ICD-10-CM | POA: Diagnosis not present

## 2016-07-25 DIAGNOSIS — R279 Unspecified lack of coordination: Secondary | ICD-10-CM

## 2016-07-25 NOTE — Therapy (Signed)
[facility] [address] [place], [place], [address] Phone: [phone]   Fax:  [phone]  Physical Therapy Treatment  Patient Details  Name: Natasha Campbell MRN: [id number] Date of Birth: 1950/06/03 Referring Provider: Michael Boston, MD  Encounter Date: 07/25/2016      PT End of Session - 07/25/16 0843    Visit Number 6   Date for PT Re-Evaluation 09/19/16   PT Start Time 0803   PT Stop Time 0844   PT Time Calculation (min) 41 min   Activity Tolerance Patient tolerated treatment well   Behavior During Therapy [facility] for tasks assessed/performed      Past Medical History:  Diagnosis Date  . Arthritis    hands  . Blood in stool    history  . Blood transfusion without reported diagnosis    self reported  . Chronic headaches   . Chronic kidney disease 2014   20% kidney function failure per patient    . Family history of breast cancer   . Family history of colon cancer   . Family history of malignant neoplasm of gastrointestinal tract   . Family history of prostate cancer   . Headache(784.0)    migraines  . Insomnia   . Rectal cancer, T1N0 s/p LAR 12/02/2010 11/01/2010    Past Surgical History:  Procedure Laterality Date  . COLON RESECTION  12/02/2010   Procedure: COLON RESECTION LAPAROSCOPIC;  Surgeon: Adin Hector, MD;  Location: [facility];  Service: General;  Laterality: N/A;  Laparoscopic Low Anterior Resection, Rigid Proctoscopy  . COLONOSCOPY     multiple d/t rectal cancer dx  . PARTIAL PROCTECTOMY BY TEM N/A 02/29/2016   Procedure: PARTIAL PROCTECTOMY BY TEM OF RECTAL MASS ERAS PATHWAY;  Surgeon: Michael Boston, MD;  Location: [facility];  Service: General;  Laterality: N/A;  . TONSILLECTOMY AND ADENOIDECTOMY     age 48    There were no vitals filed for this visit.      Subjective Assessment - 07/25/16 0808    Subjective It has been better with the metamucal every day, but it has been softer  because of eating fruit/veggies.     Pertinent History scoliosis, history of surgery, history of cancer   Limitations Walking   Patient Stated Goals improve bowel and bladder function, no incontinence   Currently in Pain? No/denies                         [facility] Adult PT Treatment/Exercise - 07/25/16 0001      Neuro Re-ed    Neuro Re-ed Details  diaphragmatic breathing with verbal cues for body awreness in shoulders, ribcage, abdoman and pelvic floor     Lumbar Exercises: Supine   Ab Set 5 seconds  pelvic floor contraction   Clam 10 reps   Bridge 20 reps  10x with band, 10x with ball squeeze     Lumbar Exercises: Sidelying   Clam 20 reps  clam and reverse     Manual Therapy   Manual Therapy Myofascial release   Myofascial Release suboccipital and cervical fascial release                  PT Short Term Goals - 07/25/16 0846      PT SHORT TERM GOAL #1   Title improved consistency of bowel movements due to regular meals, fiber, and abdominal massage techniques   Time 4   Period Weeks  Status Achieved     PT SHORT TERM GOAL #2   Title able to bulge pelvic floor due to increased muscle length   Time 4   Period Weeks   Status On-going           PT Long Term Goals - 07/25/16 0848      PT LONG TERM GOAL #1   Title FOTO < or = to 39% limited on bowel leakage survey   Time 8   Period Weeks   Status On-going   Target Date 09/19/16     PT LONG TERM GOAL #2   Title pt independent with advanced HEP   Time 8   Period Weeks   Status On-going     PT LONG TERM GOAL #3   Title able to hold pelvic floor contraction of 2/5 for 10 seconds due to improved muscle function and endurance   Baseline 5 sec   Time 8   Period Weeks   Status On-going     PT LONG TERM GOAL #4   Title Pt demonstrates improved bowel control with no bowel leakage for 2 weeks   Time 8   Period Weeks   Status On-going     PT LONG TERM GOAL #5   Title reports stool is  quarter size diameter consistently due to increased abilty for anal sphincter muscles to relax   Baseline a little bigger than her thumb   Time 8   Period Weeks   Status On-going               Plan - 07/25/16 6789    Clinical Impression Statement Patient continues to demonstrate progress.  She has not been able to come to PT as consistently recenlty due to family obligations.  Pt is Patient able to maintain pelvic contraction with tactile cues during exercises today. Pt was having a headache which felt release with myofascial release and using diaphragmatic breathing for improved muscle relaxation   Rehab Potential Excellent   Clinical Impairments Affecting Rehab Potential scoliosis, history of surgery, history of cancer   PT Frequency 2x / week   PT Duration 8 weeks   PT Treatment/Interventions ADLs/Self Care Home Management;Biofeedback;Cryotherapy;Electrical Stimulation;Moist Heat;Ultrasound;Neuromuscular re-education;Therapeutic activities;Therapeutic exercise;Patient/family education;Manual techniques;Taping;Dry needling;Scar mobilization   PT Next Visit Plan internal STM, assess endurance of pelvic floor work up to longer contraction   Consulted and Agree with Plan of Care Patient      Patient will benefit from skilled therapeutic intervention in order to improve the following deficits and impairments:  Decreased strength, Decreased coordination, Increased muscle spasms, Increased fascial restricitons, Postural dysfunction  Visit Diagnosis: Muscle weakness (generalized) - Plan: PT plan of care cert/re-cert  Unspecified lack of coordination - Plan: PT plan of care cert/re-cert     Problem List Patient Active Problem List   Diagnosis Date Noted  . Family history of colon cancer   . Family history of breast cancer   . Family history of prostate cancer   . Adenomatous rectal polyp s/p TEM resection 02/29/2016 02/29/2016  . Pelvic pain 05/11/2011  . Polyuria 05/11/2011  .  Bowel habit changes 05/11/2011  . Heartburn 02/06/2011  . Postoperative anemia 01/12/2011  . Insomnia 12/15/2010  . Rectal cancer, T1N0 s/p LAR 12/02/2010 11/01/2010  . Family history of malignant neoplasm of gastrointestinal tract 09/02/2010    Zannie Cove, PT 07/25/2016, 1:27 PM  [facility] [address] [place], [place], [address] Phone: [phone]  Fax:  [phone]  Name: DAMONICA CHOPRA MRN: [id number] Date of Birth: Mar 13, 1950

## 2016-08-01 ENCOUNTER — Encounter: Payer: BLUE CROSS/BLUE SHIELD | Admitting: Physical Therapy

## 2016-08-08 ENCOUNTER — Ambulatory Visit: Payer: BLUE CROSS/BLUE SHIELD | Attending: Surgery | Admitting: Physical Therapy

## 2016-08-08 DIAGNOSIS — R279 Unspecified lack of coordination: Secondary | ICD-10-CM | POA: Diagnosis present

## 2016-08-08 DIAGNOSIS — M6281 Muscle weakness (generalized): Secondary | ICD-10-CM

## 2016-08-08 NOTE — Patient Instructions (Signed)
   Deep Squat ([place])  1. Assume a deep squat position, with elbows between the knees and heels firmly planted on the ground (you may prop up against a wall or use a yoga block under the buttock). 2. Relax your pelvic floor and try to deepen into the stretch.  3. Hold this position for 2-3 minutes, breathing deeply.   Squatty Potty or Stool  Balloon Breath    Place hands LIGHTLY on belly below navel. Imagine a balloon inside belly. Blow up balloon on breath IN, deflate balloon on breath OUT. Contract abdominals slightly to assist breath OUT. Squeeze the tube of toothpaste from the bottom, keep shoulders relaxed. Time _3__ minutes.  Copyright  VHI. All rights reserved.

## 2016-08-08 NOTE — Therapy (Signed)
[facility] [address], [place], [place], [address] Phone: [phone]   Fax:  [phone]  Physical Therapy Treatment  Patient Details  Name: Natasha Campbell MRN: [id number] Date of Birth: 1950-03-02 Referring Provider: Michael Boston, MD  Encounter Date: 08/08/2016      PT End of Session - 08/08/16 0845    Visit Number 7   Date for PT Re-Evaluation 09/19/16   PT Start Time 0801   PT Stop Time 0853   PT Time Calculation (min) 52 min   Activity Tolerance Patient tolerated treatment well   Behavior During Therapy [facility] for tasks assessed/performed      Past Medical History:  Diagnosis Date  . Arthritis    hands  . Blood in stool    history  . Blood transfusion without reported diagnosis    self reported  . Chronic headaches   . Chronic kidney disease 2014   20% kidney function failure per patient    . Family history of breast cancer   . Family history of colon cancer   . Family history of malignant neoplasm of gastrointestinal tract   . Family history of prostate cancer   . Headache(784.0)    migraines  . Insomnia   . Rectal cancer, T1N0 s/p LAR 12/02/2010 11/01/2010    Past Surgical History:  Procedure Laterality Date  . COLON RESECTION  12/02/2010   Procedure: COLON RESECTION LAPAROSCOPIC;  Surgeon: Adin Hector, MD;  Location: [facility];  Service: General;  Laterality: N/A;  Laparoscopic Low Anterior Resection, Rigid Proctoscopy  . COLONOSCOPY     multiple d/t rectal cancer dx  . PARTIAL PROCTECTOMY BY TEM N/A 02/29/2016   Procedure: PARTIAL PROCTECTOMY BY TEM OF RECTAL MASS ERAS PATHWAY;  Surgeon: Michael Boston, MD;  Location: [facility];  Service: General;  Laterality: N/A;  . TONSILLECTOMY AND ADENOIDECTOMY     age 27    There were no vitals filed for this visit.      Subjective Assessment - 08/08/16 0820    Subjective I feel like I need to work on the exercises   Pertinent History scoliosis,  history of surgery, history of cancer   Patient Stated Goals improve bowel and bladder function, no incontinence   Currently in Pain? No/denies                         [facility] Adult PT Treatment/Exercise - 08/08/16 0001      Self-Care   Self-Care Other Self-Care Comments   Other Self-Care Comments  reviewed toileting techniques with focus on correct seating positioning     Neuro Re-ed    Neuro Re-ed Details  balloon breathing for pelvic floor bulging, stretches with breathing, abdominal brace with breathing as see in handout                PT Education - 08/08/16 0905    Education provided Yes   Education Details HEP   Person(s) Educated Patient   Methods Explanation;Handout;Demonstration   Comprehension Verbalized understanding;Returned demonstration          PT Short Term Goals - 08/08/16 0819      PT SHORT TERM GOAL #1   Title improved consistency of bowel movements due to regular meals, fiber, and abdominal massage techniques   Time 4   Period Weeks   Status Achieved     PT SHORT TERM GOAL #2   Title able to bulge pelvic floor  due to increased muscle length   Time 4   Period Weeks   Status On-going     PT SHORT TERM GOAL #3   Title able to perform initial HEP correctly for improved bowel control   Time 4   Period Weeks   Status Achieved           PT Long Term Goals - 08/08/16 0847      PT LONG TERM GOAL #1   Title FOTO < or = to 39% limited on bowel leakage survey   Time 8   Period Weeks   Status Not Met     PT LONG TERM GOAL #2   Title pt independent with advanced HEP   Time 8   Period Weeks   Status Achieved     PT LONG TERM GOAL #3   Title able to hold pelvic floor contraction of 2/5 for 10 seconds due to improved muscle function and endurance   Time 8   Period Weeks   Status Not Met     PT LONG TERM GOAL #4   Title Pt demonstrates improved bowel control with no bowel leakage for 2 weeks   Baseline 4x/week   Time  8   Period Weeks   Status Not Met     PT LONG TERM GOAL #5   Title reports stool is quarter size diameter consistently due to increased abilty for anal sphincter muscles to relax   Baseline yes but not consistently   Time 8   Period Weeks   Status Not Met               Plan - 08/08/16 0855    Clinical Impression Statement Patient does not feel like she has been able to focus on the exercises and would like to make today her last day.  She is independent with the exercises and will continue to work on progressing herself through the program as she is able.  Pt will discharge from PT   Clinical Impairments Affecting Rehab Potential scoliosis, history of surgery, history of cancer   PT Treatment/Interventions ADLs/Self Care Home Management;Biofeedback;Cryotherapy;Electrical Stimulation;Moist Heat;Ultrasound;Neuromuscular re-education;Therapeutic activities;Therapeutic exercise;Patient/family education;Manual techniques;Taping;Dry needling;Scar mobilization   PT Next Visit Plan discharged today   Consulted and Agree with Plan of Care Patient      Patient will benefit from skilled therapeutic intervention in order to improve the following deficits and impairments:  Decreased strength, Decreased coordination, Increased muscle spasms, Increased fascial restricitons, Postural dysfunction  Visit Diagnosis: Muscle weakness (generalized)  Unspecified lack of coordination     Problem List Patient Active Problem List   Diagnosis Date Noted  . Family history of colon cancer   . Family history of breast cancer   . Family history of prostate cancer   . Adenomatous rectal polyp s/p TEM resection 02/29/2016 02/29/2016  . Pelvic pain 05/11/2011  . Polyuria 05/11/2011  . Bowel habit changes 05/11/2011  . Heartburn 02/06/2011  . Postoperative anemia 01/12/2011  . Insomnia 12/15/2010  . Rectal cancer, T1N0 s/p LAR 12/02/2010 11/01/2010  . Family history of malignant neoplasm of  gastrointestinal tract 09/02/2010    Zannie Cove, PT 08/08/2016, 9:10 AM  [facility] [address], [place], [place], [address] Phone: [phone]   Fax:  [phone]  Name: Natasha Campbell MRN: [id number] Date of Birth: 1950/08/06  PHYSICAL THERAPY DISCHARGE SUMMARY  Visits from Start of Care: 7  Current functional level related to goals / functional outcomes:  See above goals   Remaining deficits: See above   Education / Equipment: HEP  Plan: Patient agrees to discharge.  Patient goals were not met. Patient is being discharged due to lack of progress.  ?????         Google, PT 08/08/16 9:11 AM

## 2016-08-22 ENCOUNTER — Encounter: Payer: BLUE CROSS/BLUE SHIELD | Admitting: Physical Therapy

## 2016-09-11 ENCOUNTER — Telehealth: Payer: Self-pay | Admitting: *Deleted

## 2016-09-11 NOTE — Telephone Encounter (Signed)
I have spoken to patient who prefers to have colonoscopy sometime in October or November. She states she just feels like with her history of 2 colon cancers, she would just feel better going forward with colonoscopy now. Patient is scheduled for colonoscopy in November as well as previsit. She verbalizes understanding.

## 2016-09-11 NOTE — Telephone Encounter (Signed)
-----   Message from Jerene Bears, MD sent at 09/11/2016 11:46 AM EDT ----- Regarding: FW: wants colonoscopy sooner Dottie, Please see below from Dr. Johney Maine Patient had an interval rectal cancer and is nervous to wait 1 year I am okay to perform the colonoscopy earlier Please contact the patient and see what her fears are, okay to schedule if that is what she prefers JMP  ----- Message ----- From: Michael Boston, MD Sent: 09/07/2016   9:21 PM To: Jerene Bears, MD Subject: wants colonoscopy sooner                       I faxed my note on her.  She is a lady with proximal rectal cancer status post low anterior 2012.   Then had small distal very ealy rectal cancer in a polyp that I excised by TEM earlier this year.  Struggles with incontinence and irregular bowels but a little bit better.  She is anxious to get a colonoscopy done sooner than February.  I will defer to you.  There is no evidence of cancer recurrence by anoscopy on the very distal cancer removed six months ago.  There is no evidence of cancer recurrence at the anastomosis from the colonoscopy earlier this year.

## 2016-09-11 NOTE — Telephone Encounter (Signed)
Left voicemail for patient to call back. 

## 2016-10-30 ENCOUNTER — Ambulatory Visit (AMBULATORY_SURGERY_CENTER): Payer: Self-pay | Admitting: *Deleted

## 2016-10-30 VITALS — Ht 66.0 in | Wt 146.6 lb

## 2016-10-30 DIAGNOSIS — Z800 Family history of malignant neoplasm of digestive organs: Secondary | ICD-10-CM

## 2016-10-30 DIAGNOSIS — Z85038 Personal history of other malignant neoplasm of large intestine: Secondary | ICD-10-CM

## 2016-10-30 MED ORDER — NA SULFATE-K SULFATE-MG SULF 17.5-3.13-1.6 GM/177ML PO SOLN: 1.0000 [IU] | Freq: Once | ORAL | 0 refills | Status: AC

## 2016-10-30 NOTE — Progress Notes (Signed)
No egg or soy allergy known to patient  No issues with past sedation with any surgeries  or procedures, no intubation problems  No diet pills per patient No home 02 use per patient  No blood thinners per patient  Pt denies issues with constipation takes Metamucil daily and Miralax prn No A fib or A flutter  EMMI video sent to pt's e mail pt. declined

## 2016-11-16 ENCOUNTER — Encounter: Payer: Self-pay | Admitting: Internal Medicine

## 2016-11-22 ENCOUNTER — Other Ambulatory Visit: Payer: Self-pay

## 2016-11-22 ENCOUNTER — Encounter: Payer: Self-pay | Admitting: Internal Medicine

## 2016-11-22 ENCOUNTER — Ambulatory Visit (AMBULATORY_SURGERY_CENTER): Payer: BLUE CROSS/BLUE SHIELD | Admitting: Internal Medicine

## 2016-11-22 VITALS — BP 135/82 | HR 70 | Temp 96.9°F | Resp 11 | Ht 66.0 in | Wt 146.0 lb

## 2016-11-22 DIAGNOSIS — K6289 Other specified diseases of anus and rectum: Secondary | ICD-10-CM | POA: Diagnosis not present

## 2016-11-22 DIAGNOSIS — Z85038 Personal history of other malignant neoplasm of large intestine: Secondary | ICD-10-CM | POA: Diagnosis not present

## 2016-11-22 DIAGNOSIS — Z85048 Personal history of other malignant neoplasm of rectum, rectosigmoid junction, and anus: Secondary | ICD-10-CM

## 2016-11-22 MED ORDER — SODIUM CHLORIDE 0.9 % IV SOLN: 500.0000 mL | INTRAVENOUS | Status: DC
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
## 2016-11-22 NOTE — Progress Notes (Signed)
Called to room to assist during endoscopic procedure.  Patient ID and intended procedure confirmed with present staff. Received instructions for my participation in the procedure from the performing physician.  

## 2016-11-22 NOTE — Patient Instructions (Addendum)
YOU HAD AN ENDOSCOPIC PROCEDURE TODAY AT [facility]:   Refer to the procedure report that was given to you for any specific questions about what was found during the examination.  If the procedure report does not answer your questions, please call your gastroenterologist to clarify.  If you requested that your care partner not be given the details of your procedure findings, then the procedure report has been included in a sealed envelope for you to review at your convenience later.  YOU SHOULD EXPECT: Some feelings of bloating in the abdomen. Passage of more gas than usual.  Walking can help get rid of the air that was put into your GI tract during the procedure and reduce the bloating. If you had a lower endoscopy (such as a colonoscopy or flexible sigmoidoscopy) you may notice spotting of blood in your stool or on the toilet paper. If you underwent a bowel prep for your procedure, you may not have a normal bowel movement for a few days.  Please Note:  You might notice some irritation and congestion in your nose or some drainage.  This is from the oxygen used during your procedure.  There is no need for concern and it should clear up in a day or so.  SYMPTOMS TO REPORT IMMEDIATELY:   Following lower endoscopy (colonoscopy or flexible sigmoidoscopy):  Excessive amounts of blood in the stool  Significant tenderness or worsening of abdominal pains  Swelling of the abdomen that is new, acute  Fever of 100F or higher   For urgent or emergent issues, a gastroenterologist can be reached at any hour by calling [phone].   DIET:  We do recommend a small meal at first, but then you may proceed to your regular diet.  Drink plenty of fluids but you should avoid alcoholic beverages for 24 hours.  ACTIVITY:  You should plan to take it easy for the rest of today and you should NOT DRIVE or use heavy machinery until tomorrow (because of the sedation medicines used during the test).     FOLLOW UP: Our staff will call the number listed on your records the next business day following your procedure to check on you and address any questions or concerns that you may have regarding the information given to you following your procedure. If we do not reach you, we will leave a message.  However, if you are feeling well and you are not experiencing any problems, there is no need to return our call.  We will assume that you have returned to your regular daily activities without incident.  If any biopsies were taken you will be contacted by phone or by letter within the next 1-3 weeks.  Please call us at [phone] if you have not heard about the biopsies in 3 weeks.    SIGNATURES/CONFIDENTIALITY: You and/or your care partner have signed paperwork which will be entered into your electronic medical record.  These signatures attest to the fact that that the information above on your After Visit Summary has been reviewed and is understood.  Full responsibility of the confidentiality of this discharge information lies with you and/or your care-partner.  Repeat colonoscopy in 1 year per Dr. Hilarie Fredrickson.  Try metamucil or benefiber for your rectal atony. It might help.

## 2016-11-22 NOTE — Op Note (Signed)
[facility] Patient Name: Natasha Campbell Procedure Date: 11/22/2016 11:02 AM MRN: [id number] Endoscopist: Jerene Bears , MD Age: 66 Referring MD:  Date of Birth: [birth year] Gender: Female Account #: [id number] Procedure:                Colonoscopy Indications:              High risk colon cancer surveillance: Personal                            history of colon cancer, Last colonoscopy Feb 2018;                            history of colon cancer on rectosigmoid s/p                            resection and anastomosis; history of distal rectal                            cancer with foci of adenocarcinoma arising in a                            tubulovillous adenoma s/p transanal excision in                            March 2018 Medicines:                Monitored Anesthesia Care Procedure:                Pre-Anesthesia Assessment:                           - Prior to the procedure, a History and Physical                            was performed, and patient medications and                            allergies were reviewed. The patient's tolerance of                            previous anesthesia was also reviewed. The risks                            and benefits of the procedure and the sedation                            options and risks were discussed with the patient.                            All questions were answered, and informed consent                            was obtained. Prior Anticoagulants: The patient has  taken no previous anticoagulant or antiplatelet                            agents. ASA Grade Assessment: II - A patient with                            mild systemic disease. After reviewing the risks                            and benefits, the patient was deemed in                            satisfactory condition to undergo the procedure.                           After obtaining informed consent, the colonoscope                   was passed under direct vision. Throughout the                            procedure, the patient's blood pressure, pulse, and                            oxygen saturations were monitored continuously. The                            Colonoscope was introduced through the anus and                            advanced to the the terminal ileum. The colonoscopy                            was performed without difficulty. The patient                            tolerated the procedure well. The quality of the                            bowel preparation was excellent. The terminal                            ileum, ileocecal valve, appendiceal orifice, and                            rectum were photographed. Scope In: 11:38:11 AM Scope Out: 11:49:44 AM Scope Withdrawal Time: 0 hours 7 minutes 56 seconds  Total Procedure Duration: 0 hours 11 minutes 33 seconds  Findings:                 The perianal exam findings include scaring                            posteriorly with slight decrease in anorectal tone.  No masses palpable.                           The terminal ileum appeared normal.                           Retroflexion in the right colon was performed and                            normal.                           There was evidence of a prior end-to-end                            colo-colonic anastomosis in the recto-sigmoid                            colon. This was patent and was characterized by                            healthy appearing mucosa. The anastomosis was                            traversed.                           A localized area of slightly nodular mucosa was                            found in the distal rectum at dentate line                            posteriorly at site of transanal polypectomy.                            Biopsies were taken with a cold forceps for                            histology.                            The exam was otherwise without abnormality on                            direct and retroflexion views. Complications:            No immediate complications. Estimated Blood Loss:     Estimated blood loss was minimal. Impression:               - Scaring posteriorly with slight decrease in                            anorectal tone. No masses palpable.                           - The examined portion of the  ileum was normal.                           - Patent end-to-end colo-colonic anastomosis,                            characterized by healthy appearing mucosa.                           - Slightly nodular mucosa in the distal rectum.                            Biopsied.                           - The examination was otherwise normal on direct                            and retroflexion views. Recommendation:           - Patient has a contact number available for                            emergencies. The signs and symptoms of potential                            delayed complications were discussed with the                            patient. Return to normal activities tomorrow.                            Written discharge instructions were provided to the                            patient.                           - Resume previous diet.                           - Continue present medications.                           - Await pathology results.                           - Repeat colonoscopy in 1 year for surveillance. Jerene Bears, MD 11/22/2016 12:01:55 PM This report has been signed electronically.

## 2016-11-22 NOTE — Progress Notes (Signed)
To recovery, report to RN, VSS. 

## 2016-11-27 ENCOUNTER — Encounter: Payer: Self-pay | Admitting: Internal Medicine

## 2016-11-27 ENCOUNTER — Telehealth: Payer: Self-pay | Admitting: *Deleted

## 2016-11-27 NOTE — Telephone Encounter (Signed)
  Follow up Call-  Call back number 11/22/2016 02/07/2016 01/27/2015  Post procedure Call Back phone  # [phone]  Permission to leave phone message Yes Yes Yes  Some recent data might be hidden     Patient questions:  Do you have a fever, pain , or abdominal swelling? No. Pain Score  0 *  Have you tolerated food without any problems? Yes.    Have you been able to return to your normal activities? Yes.    Do you have any questions about your discharge instructions: Diet   No. Medications  No. Follow up visit  No.  Do you have questions or concerns about your Care? No.  Actions: * If pain score is 4 or above: No action needed, pain <4.pt has done fine after procedure,no pain ,no bleeding . Last night had bloated feeling and some diarrhea . Encouraged pt to call back if this doesn't improve since she feels better now.

## 2016-12-07 ENCOUNTER — Ambulatory Visit (INDEPENDENT_AMBULATORY_CARE_PROVIDER_SITE_OTHER): Payer: BLUE CROSS/BLUE SHIELD | Admitting: Orthopaedic Surgery

## 2016-12-07 ENCOUNTER — Ambulatory Visit (INDEPENDENT_AMBULATORY_CARE_PROVIDER_SITE_OTHER): Payer: Self-pay

## 2016-12-07 ENCOUNTER — Encounter (INDEPENDENT_AMBULATORY_CARE_PROVIDER_SITE_OTHER): Payer: Self-pay | Admitting: Orthopaedic Surgery

## 2016-12-07 DIAGNOSIS — M25562 Pain in left knee: Secondary | ICD-10-CM

## 2016-12-07 DIAGNOSIS — G8929 Other chronic pain: Secondary | ICD-10-CM

## 2016-12-07 NOTE — Progress Notes (Signed)
Office Visit Note   Patient: Natasha Campbell           Date of Birth: Dec 01, 1950           MRN: [id number] Visit Date: 12/07/2016              Requested by: Velna Hatchet, MD [address] [place], [place] [address] PCP: Velna Hatchet, MD   Assessment & Plan: Visit Diagnoses:  1. Chronic pain of left knee     Plan: For the first line of treatment I would like to try an intra-articular steroid injection in the knee to see if this would calm down any synovitis.  She understands I am looking them for this to be both diagnostic and therapeutic.  We had a long and thorough discussion about the risk and benefits of injections as well.  She did wish to have this done after I explained this to her and we placed injection without difficulty.  I would like to see her back in just 2 weeks because if she still having significant problems we would consider an MRI of the left knee to assess the meniscus based on her mildly positive McMurray's exam.  All questions and concerns were answered and addressed.  Follow-Up Instructions: Return in about 2 weeks (around 12/21/2016).   Orders:  Orders Placed This Encounter  Procedures  . XR Knee 1-2 Views Left   No orders of the defined types were placed in this encounter.     Procedures: No procedures performed   Clinical Data: No additional findings.   Subjective: No chief complaint on file.  the patient is a very pleasant 66 year old I am seeing for the first time with a chief complaint of left knee pain.  She says she has had about 6-7 years of pain in that knee after she "jammed her knee" for the last few months though is gotten worse.  She points all along the medial space of her knee in the medial joint line source of her pain.  She says when still and laying in bed is actually more painful and when she gets up to move it helps some but she is a lot of stiffness.  She denies any locking catching in her knee but is been aggravating pain that  nothing else is helping.  She is a very active individual.  She is a thin individual as well.  HPI  Review of Systems She currently denies any headache, chest pain, shortness of breath, fever, chills, nausea, vomiting.  Objective: Vital Signs: There were no vitals taken for this visit.  Physical Exam She is alert and oriented x3 and in no acute distress Ortho Exam Examination of her left knee shows no effusion.  She actually hyperextends both knees and has full flexion.  When I rotate the tibia on the femur she gets a lot of pain on the medial compartment of her knee.  There is pain in the posterior medial corner of the knee as well.  The patella seems to track well and there is no significant crepitation.  There is no significant pain over the bursa.  There is pain over the medial collateral ligament. Specialty Comments:  No specialty comments available.  Imaging: Xr Knee 1-2 Views Left  Result Date: 12/07/2016 An AP and lateral of the left knee shows well-maintained joint space without any acute findings or malalignment.    PMFS History: Patient Active Problem List   Diagnosis Date Noted  . Family history of  colon cancer   . Family history of breast cancer   . Family history of prostate cancer   . Adenomatous rectal polyp s/p TEM resection 02/29/2016 02/29/2016  . Pelvic pain 05/11/2011  . Polyuria 05/11/2011  . Bowel habit changes 05/11/2011  . Heartburn 02/06/2011  . Postoperative anemia 01/12/2011  . Insomnia 12/15/2010  . Rectal cancer, T1N0 s/p LAR 12/02/2010 11/01/2010  . Family history of malignant neoplasm of gastrointestinal tract 09/02/2010   Past Medical History:  Diagnosis Date  . Allergy   . Arthritis    hands  . Blood in stool    history  . Blood transfusion without reported diagnosis    self reported  . Chronic headaches   . Chronic kidney disease 2014   20% kidney function failure per patient  acute episode per patient  . Family history of breast  cancer   . Family history of colon cancer   . Family history of malignant neoplasm of gastrointestinal tract   . Family history of prostate cancer   . Headache(784.0)    migraines  . Insomnia   . Rectal cancer, T1N0 s/p LAR 12/02/2010 11/01/2010    Family History  Problem Relation Age of Onset  . Heart disease Mother   . Heart disease Father        pacemaker  . Colon cancer Paternal Grandmother   . Colon cancer Paternal Grandfather   . Colon cancer Maternal Grandmother   . Breast cancer Cousin        maternal first cousin  . Prostate cancer Cousin        maternal first cousin  . Stomach cancer Neg Hx   . Colon polyps Neg Hx   . Rectal cancer Neg Hx   . Esophageal cancer Neg Hx     Past Surgical History:  Procedure Laterality Date  . COLON RESECTION  12/02/2010   Procedure: COLON RESECTION LAPAROSCOPIC;  Surgeon: Adin Hector, MD;  Location: [facility];  Service: General;  Laterality: N/A;  Laparoscopic Low Anterior Resection, Rigid Proctoscopy  . COLON SURGERY    . COLONOSCOPY     multiple d/t rectal cancer dx  . PARTIAL PROCTECTOMY BY TEM N/A 02/29/2016   Procedure: PARTIAL PROCTECTOMY BY TEM OF RECTAL MASS ERAS PATHWAY;  Surgeon: Michael Boston, MD;  Location: [facility];  Service: General;  Laterality: N/A;  . POLYPECTOMY    . TONSILLECTOMY AND ADENOIDECTOMY     age 67   Social History   Occupational History    Employer: [employer]  Tobacco Use  . Smoking status: Never Smoker  . Smokeless tobacco: Never Used  Substance and Sexual Activity  . Alcohol use: Yes    Alcohol/week: 0.0 oz    Comment: seldom  . Drug use: No  . Sexual activity: Yes    Birth control/protection: Post-menopausal

## 2016-12-18 ENCOUNTER — Other Ambulatory Visit: Payer: Self-pay | Admitting: Dermatology

## 2016-12-20 ENCOUNTER — Ambulatory Visit (INDEPENDENT_AMBULATORY_CARE_PROVIDER_SITE_OTHER): Payer: BLUE CROSS/BLUE SHIELD | Admitting: Orthopaedic Surgery

## 2016-12-20 DIAGNOSIS — M25562 Pain in left knee: Secondary | ICD-10-CM | POA: Diagnosis not present

## 2016-12-20 DIAGNOSIS — G8929 Other chronic pain: Secondary | ICD-10-CM | POA: Insufficient documentation

## 2016-12-20 NOTE — Progress Notes (Signed)
The patient is following up after having a steroid injection her left knee.  She said that is helped greatly and is about 75% better.  She is sleeping better at night as well.  She did a lot of walking still weakens her knee.  Examination of her left knee there is no effusion but she does have medial joint line tenderness and still irritates her knee without place McMurray's exam on the medial compartment.  At this point we will continue to watch her knee conservatively and like to see her back in 6 weeks to consider repeat injection and if she still having the same symptoms with medial joint tenderness and McMurray's exam is positive will obtain an MRI but hopefully will not need to.  Review of her plain films of her knee shows well-maintained joint spaces on the left knee.  All questions and concerns were answered and addressed.

## 2017-01-31 ENCOUNTER — Ambulatory Visit (INDEPENDENT_AMBULATORY_CARE_PROVIDER_SITE_OTHER): Payer: BLUE CROSS/BLUE SHIELD | Admitting: Orthopaedic Surgery

## 2017-01-31 ENCOUNTER — Encounter (INDEPENDENT_AMBULATORY_CARE_PROVIDER_SITE_OTHER): Payer: Self-pay | Admitting: Orthopaedic Surgery

## 2017-01-31 DIAGNOSIS — M25511 Pain in right shoulder: Secondary | ICD-10-CM

## 2017-01-31 DIAGNOSIS — G8929 Other chronic pain: Secondary | ICD-10-CM | POA: Diagnosis not present

## 2017-01-31 DIAGNOSIS — M25562 Pain in left knee: Secondary | ICD-10-CM

## 2017-01-31 NOTE — Progress Notes (Signed)
Office Visit Note   Patient: Natasha Campbell           Date of Birth: 1950-04-22           MRN: [id number] Visit Date: 01/31/2017              Requested by: Velna Hatchet, MD [address] [place], [place] [address] PCP: Velna Hatchet, MD   Assessment & Plan: Visit Diagnoses:  1. Chronic pain of left knee   2. Chronic right shoulder pain     Plan: She understands we need to hold off on a steroid injection in her left knee.  We will see her back in 4 weeks and consider repeat steroid injection her left knee and then later a hyaluronic acid injection.  I gave her handout about that.  I did feel comfortable placing a steroid injection in her right shoulder subacromial space today.  She understands the risk and benefits behind this as well.  Again we will reevaluate her right shoulder and her left knee in 4 weeks.  Follow-Up Instructions: Return in about 4 weeks (around 02/28/2017).   Orders:  No orders of the defined types were placed in this encounter.  No orders of the defined types were placed in this encounter.     Procedures: No procedures performed   Clinical Data: No additional findings.   Subjective: Chief Complaint  Patient presents with  . Left Knee - Follow-up  The patient is now about 7 weeks status post a steroid injection her left knee.  Her main symptoms are at night and that is what makes her previous no locking catching in that knee.  She has been having though shoulder problems with the right shoulder with overhead activities and reaching across in front of her pain has been waking her some up at night as well.  HPI  Review of Systems She currently denies any headache, chest pain, shortness of breath, fever, chills, nausea, vomiting.  Objective: Vital Signs: There were no vitals taken for this visit.  Physical Exam She is alert and oriented x3 and in no acute distress Ortho Exam Examination of her left knee continues to show no effusion.  Her  Lachman's McMurray's exam are negative.  She has some slight patellofemoral crepitation.  There is no warmth to the knee.  Examination of her right shoulder does show positive Neer and Hawkins signs most her pain is around the biceps tendon and anterior shoulder showing signs of impingement.  Rotator cuff is strong and range of motion is entirely full.  Her liftoff is negative. Specialty Comments:  No specialty comments available.  Imaging: No results found.   PMFS History: Patient Active Problem List   Diagnosis Date Noted  . Chronic pain of left knee 12/20/2016  . Family history of colon cancer   . Family history of breast cancer   . Family history of prostate cancer   . Adenomatous rectal polyp s/p TEM resection 02/29/2016 02/29/2016  . Pelvic pain 05/11/2011  . Polyuria 05/11/2011  . Bowel habit changes 05/11/2011  . Heartburn 02/06/2011  . Postoperative anemia 01/12/2011  . Insomnia 12/15/2010  . Rectal cancer, T1N0 s/p LAR 12/02/2010 11/01/2010  . Family history of malignant neoplasm of gastrointestinal tract 09/02/2010   Past Medical History:  Diagnosis Date  . Allergy   . Arthritis    hands  . Blood in stool    history  . Blood transfusion without reported diagnosis    self reported  .  Chronic headaches   . Chronic kidney disease 2014   20% kidney function failure per patient  acute episode per patient  . Family history of breast cancer   . Family history of colon cancer   . Family history of malignant neoplasm of gastrointestinal tract   . Family history of prostate cancer   . Headache(784.0)    migraines  . Insomnia   . Rectal cancer, T1N0 s/p LAR 12/02/2010 11/01/2010    Family History  Problem Relation Age of Onset  . Heart disease Mother   . Heart disease Father        pacemaker  . Colon cancer Paternal Grandmother   . Colon cancer Paternal Grandfather   . Colon cancer Maternal Grandmother   . Breast cancer Cousin        maternal first cousin  .  Prostate cancer Cousin        maternal first cousin  . Stomach cancer Neg Hx   . Colon polyps Neg Hx   . Rectal cancer Neg Hx   . Esophageal cancer Neg Hx     Past Surgical History:  Procedure Laterality Date  . COLON RESECTION  12/02/2010   Procedure: COLON RESECTION LAPAROSCOPIC;  Surgeon: Adin Hector, MD;  Location: [facility];  Service: General;  Laterality: N/A;  Laparoscopic Low Anterior Resection, Rigid Proctoscopy  . COLON SURGERY    . COLONOSCOPY     multiple d/t rectal cancer dx  . PARTIAL PROCTECTOMY BY TEM N/A 02/29/2016   Procedure: PARTIAL PROCTECTOMY BY TEM OF RECTAL MASS ERAS PATHWAY;  Surgeon: Michael Boston, MD;  Location: [facility];  Service: General;  Laterality: N/A;  . POLYPECTOMY    . TONSILLECTOMY AND ADENOIDECTOMY     age 26   Social History   Occupational History    Employer: [employer]  Tobacco Use  . Smoking status: Never Smoker  . Smokeless tobacco: Never Used  Substance and Sexual Activity  . Alcohol use: Yes    Alcohol/week: 0.0 oz    Comment: seldom  . Drug use: No  . Sexual activity: Yes    Birth control/protection: Post-menopausal

## 2017-02-28 ENCOUNTER — Other Ambulatory Visit: Payer: Self-pay | Admitting: Dermatology

## 2017-03-07 ENCOUNTER — Ambulatory Visit (INDEPENDENT_AMBULATORY_CARE_PROVIDER_SITE_OTHER): Payer: BLUE CROSS/BLUE SHIELD | Admitting: Orthopaedic Surgery

## 2017-03-07 ENCOUNTER — Encounter (INDEPENDENT_AMBULATORY_CARE_PROVIDER_SITE_OTHER): Payer: Self-pay | Admitting: Orthopaedic Surgery

## 2017-03-07 DIAGNOSIS — G8929 Other chronic pain: Secondary | ICD-10-CM

## 2017-03-07 DIAGNOSIS — M1712 Unilateral primary osteoarthritis, left knee: Secondary | ICD-10-CM

## 2017-03-07 DIAGNOSIS — M25562 Pain in left knee: Secondary | ICD-10-CM | POA: Diagnosis not present

## 2017-03-07 MED ORDER — LIDOCAINE HCL 1 % IJ SOLN
3.0000 mL | INTRAMUSCULAR | Status: AC | PRN
  Administered 2017-03-07: 3 mL

## 2017-03-07 MED ORDER — METHYLPREDNISOLONE ACETATE 40 MG/ML IJ SUSP
40.0000 mg | INTRAMUSCULAR | Status: AC | PRN
  Administered 2017-03-07: 40 mg via INTRA_ARTICULAR

## 2017-03-07 NOTE — Progress Notes (Signed)
Office Visit Note   Patient: Natasha Campbell           Date of Birth: [birth year]           MRN: [id number] Visit Date: 03/07/2017              Requested by: Velna Hatchet, MD [address] [place], [place] [address] PCP: Velna Hatchet, MD   Assessment & Plan: Visit Diagnoses:  1. Chronic pain of left knee   2. Unilateral primary osteoarthritis, left knee     Plan: Per her wishes I provided a steroid injection her left knee without difficulty.  I gave her another handout about hyaluronic acid and she will think about this.  If she decides to have an injection like that she will give [place] a call.  She will otherwise follow-up as needed.  Follow-Up Instructions: Return if symptoms worsen or fail to improve.   Orders:  Orders Placed This Encounter  Procedures  . Large Joint Inj   No orders of the defined types were placed in this encounter.     Procedures: Large Joint Inj: L knee on 03/07/2017 1:23 PM Indications: diagnostic evaluation and pain Details: 22 G 1.5 in needle, superolateral approach  Arthrogram: No  Medications: 3 mL lidocaine 1 %; 40 mg methylPREDNISolone acetate 40 MG/ML Outcome: tolerated well, no immediate complications Procedure, treatment alternatives, risks and benefits explained, specific risks discussed. Consent was given by the patient. Immediately prior to procedure a time out was called to verify the correct patient, procedure, equipment, support staff and site/side marked as required. Patient was prepped and draped in the usual sterile fashion.       Clinical Data: No additional findings.   Subjective: Chief Complaint  Patient presents with  . Left Knee - Pain, Follow-up  The patient is well-known to me.  She is coming in today requesting a steroid injection her left knee.  Is been enough time since her last injection that I am fine with placing this in her knee today.  This is to treat moderate arthritic pain.  She is still interested at  some point of a hyaluronic acid injection if needed.  She is still rather have a steroid today.  She denies any acute changes in her medical status.  HPI  Review of Systems She denies any fever, chills, nausea, vomiting  Objective: Vital Signs: There were no vitals taken for this visit.  Physical Exam She is alert and oriented x3 and in no acute distress Ortho Exam Examination of her left knee shows no effusion with excellent range of motion with some mild medial joint line tenderness and mild patellofemoral crepitation. Specialty Comments:  No specialty comments available.  Imaging: No results found.   PMFS History: Patient Active Problem List   Diagnosis Date Noted  . Unilateral primary osteoarthritis, left knee 03/07/2017  . Chronic pain of left knee 12/20/2016  . Family history of colon cancer   . Family history of breast cancer   . Family history of prostate cancer   . Adenomatous rectal polyp s/p TEM resection 02/29/2016 02/29/2016  . Pelvic pain 05/11/2011  . Polyuria 05/11/2011  . Bowel habit changes 05/11/2011  . Heartburn 02/06/2011  . Postoperative anemia 01/12/2011  . Insomnia 12/15/2010  . Rectal cancer, T1N0 s/p LAR 12/02/2010 11/01/2010  . Family history of malignant neoplasm of gastrointestinal tract 09/02/2010   Past Medical History:  Diagnosis Date  . Allergy   . Arthritis    hands  .  Blood in stool    history  . Blood transfusion without reported diagnosis    self reported  . Chronic headaches   . Chronic kidney disease 2014   20% kidney function failure per patient  acute episode per patient  . Family history of breast cancer   . Family history of colon cancer   . Family history of malignant neoplasm of gastrointestinal tract   . Family history of prostate cancer   . Headache(784.0)    migraines  . Insomnia   . Rectal cancer, T1N0 s/p LAR 12/02/2010 11/01/2010    Family History  Problem Relation Age of Onset  . Heart disease Mother   .  Heart disease Father        pacemaker  . Colon cancer Paternal Grandmother   . Colon cancer Paternal Grandfather   . Colon cancer Maternal Grandmother   . Breast cancer Cousin        maternal first cousin  . Prostate cancer Cousin        maternal first cousin  . Stomach cancer Neg Hx   . Colon polyps Neg Hx   . Rectal cancer Neg Hx   . Esophageal cancer Neg Hx     Past Surgical History:  Procedure Laterality Date  . COLON RESECTION  12/02/2010   Procedure: COLON RESECTION LAPAROSCOPIC;  Surgeon: Adin Hector, MD;  Location: [facility];  Service: General;  Laterality: N/A;  Laparoscopic Low Anterior Resection, Rigid Proctoscopy  . COLON SURGERY    . COLONOSCOPY     multiple d/t rectal cancer dx  . PARTIAL PROCTECTOMY BY TEM N/A 02/29/2016   Procedure: PARTIAL PROCTECTOMY BY TEM OF RECTAL MASS ERAS PATHWAY;  Surgeon: Michael Boston, MD;  Location: [facility];  Service: General;  Laterality: N/A;  . POLYPECTOMY    . TONSILLECTOMY AND ADENOIDECTOMY     age 67   Social History   Occupational History    Employer: [employer]  Tobacco Use  . Smoking status: Never Smoker  . Smokeless tobacco: Never Used  Substance and Sexual Activity  . Alcohol use: Yes    Alcohol/week: 0.0 oz    Comment: seldom  . Drug use: No  . Sexual activity: Yes    Birth control/protection: Post-menopausal

## 2017-05-07 ENCOUNTER — Encounter: Payer: Self-pay | Admitting: Oncology

## 2017-05-07 ENCOUNTER — Telehealth: Payer: Self-pay | Admitting: Oncology

## 2017-05-07 NOTE — Telephone Encounter (Signed)
Patient called to reschedule  °

## 2017-05-09 ENCOUNTER — Telehealth: Payer: Self-pay

## 2017-05-09 NOTE — Telephone Encounter (Signed)
Spoke with pt regarding new pt appt. Pt voiced concern regarding appt and cost. States "I was seen there a year ago and had a consultation and this time I'd prefer to get just the labs". Pt doesn't want to "duplicate something that's already been done". Explained to pt that she had an appt with a genetic counselor 59yr prior and this is a MD visit. Pt still prefers to know if provider appt is necessary or the same. Will consult MD.

## 2017-05-11 ENCOUNTER — Telehealth: Payer: Self-pay

## 2017-05-11 DIAGNOSIS — Z803 Family history of malignant neoplasm of breast: Secondary | ICD-10-CM

## 2017-05-11 DIAGNOSIS — Z800 Family history of malignant neoplasm of digestive organs: Secondary | ICD-10-CM

## 2017-05-11 DIAGNOSIS — C20 Malignant neoplasm of rectum: Secondary | ICD-10-CM

## 2017-05-11 DIAGNOSIS — Z8042 Family history of malignant neoplasm of prostate: Secondary | ICD-10-CM

## 2017-05-11 NOTE — Telephone Encounter (Signed)
Spoke with patient to let her know that the med/onc appointment with Dr. Benay Spice had been cancelled and that a scheduling message had been sent for a genetics appointment.

## 2017-05-14 ENCOUNTER — Telehealth: Payer: Self-pay | Admitting: Genetics

## 2017-05-14 ENCOUNTER — Encounter: Payer: Self-pay | Admitting: Genetics

## 2017-05-14 NOTE — Telephone Encounter (Signed)
A genetic counseling appt has been scheduled for the pt to see Ferol Luz on 6/17 at 4pm. Pt aware and has agreed to the appt date and time. Letter mailed.

## 2017-05-21 ENCOUNTER — Ambulatory Visit: Payer: BLUE CROSS/BLUE SHIELD | Admitting: Oncology

## 2017-05-22 ENCOUNTER — Ambulatory Visit: Payer: BLUE CROSS/BLUE SHIELD | Admitting: Oncology

## 2017-06-13 ENCOUNTER — Other Ambulatory Visit: Payer: Self-pay

## 2017-06-13 ENCOUNTER — Ambulatory Visit: Payer: BLUE CROSS/BLUE SHIELD | Attending: Surgery | Admitting: Physical Therapy

## 2017-06-13 ENCOUNTER — Encounter: Payer: Self-pay | Admitting: Physical Therapy

## 2017-06-13 DIAGNOSIS — M6281 Muscle weakness (generalized): Secondary | ICD-10-CM | POA: Insufficient documentation

## 2017-06-13 DIAGNOSIS — R252 Cramp and spasm: Secondary | ICD-10-CM | POA: Diagnosis present

## 2017-06-13 DIAGNOSIS — R279 Unspecified lack of coordination: Secondary | ICD-10-CM

## 2017-06-13 NOTE — Therapy (Signed)
[facility] [address], [place], [place], [address] Phone: [phone]   Fax:  [phone]  Physical Therapy Evaluation  Patient Details  Name: Natasha Campbell MRN: [id number] Date of Birth: [birth year] Referring Provider: Dr. Michael Boston   Encounter Date: 06/13/2017  PT End of Session - 06/13/17 1401    Visit Number  1    Date for PT Re-Evaluation  09/05/17    Authorization Type  [employer]    PT Start Time  0845    PT Stop Time  0930    PT Time Calculation (min)  45 min    Activity Tolerance  Patient tolerated treatment well    Behavior During Therapy  [facility] for tasks assessed/performed       Past Medical History:  Diagnosis Date  . Allergy   . Arthritis    hands  . Blood in stool    history  . Blood transfusion without reported diagnosis    self reported  . Chronic headaches   . Chronic kidney disease 2014   20% kidney function failure per patient  acute episode per patient  . Family history of breast cancer   . Family history of colon cancer   . Family history of malignant neoplasm of gastrointestinal tract   . Family history of prostate cancer   . Headache(784.0)    migraines  . Insomnia   . Rectal cancer, T1N0 s/p LAR 12/02/2010 11/01/2010    Past Surgical History:  Procedure Laterality Date  . COLON RESECTION  12/02/2010   Procedure: COLON RESECTION LAPAROSCOPIC;  Surgeon: Adin Hector, MD;  Location: [facility];  Service: General;  Laterality: N/A;  Laparoscopic Low Anterior Resection, Rigid Proctoscopy  . COLON SURGERY    . COLONOSCOPY     multiple d/t rectal cancer dx  . PARTIAL PROCTECTOMY BY TEM N/A 02/29/2016   Procedure: PARTIAL PROCTECTOMY BY TEM OF RECTAL MASS ERAS PATHWAY;  Surgeon: Michael Boston, MD;  Location: [facility];  Service: General;  Laterality: N/A;  . POLYPECTOMY    . TONSILLECTOMY AND ADENOIDECTOMY     age 22    There were no vitals filed for this visit.   Subjective  Assessment - 06/13/17 0849    Subjective  Incontinence of the bowels. MD wanted me to see you for the incontinence. Never have a big bowel movement.  They are long and thin, short and not wide.  Most often stool are solid.  When walk a nugget will fall out. I use desitin and toilet paper to hold the nugget in.  I have bowel movements 3 per day.  Leakage with nugget is 10. After she eats she will loose a nugget. Patient will use Metamucil morning and late afternoon. Sometimes the stool gets stuck in the rectum.     Patient Stated Goals  regular bowel movements and not to loose stools    Currently in Pain?  No/denies         [facility] PT Assessment - 06/13/17 0001      Assessment   Medical Diagnosis  C20 rectal cancer    Referring Provider  Dr. Michael Boston    Onset Date/Surgical Date  02/29/16    Prior Therapy  yes      Precautions   Precautions  Other (comment)    Precaution Comments  cancer      Restrictions   Weight Bearing Restrictions  No      Balance Screen   Has the  patient fallen in the past 6 months  No    Has the patient had a decrease in activity level because of a fear of falling?   No    Is the patient reluctant to leave their home because of a fear of falling?   No      Home Film/video editor residence      Prior Function   Level of Independence  Independent    Vocation  Unemployed      Cognition   Overall Cognitive Status  Within Functional Limits for tasks assessed      Posture/Postural Control   Posture/Postural Control  No significant limitations      ROM / Strength   AROM / PROM / Strength  AROM;PROM;Strength      Strength   Right Hip Extension  4/5    Right Hip ABduction  4-/5    Left Hip Extension  4/5    Left Hip ABduction  4-/5      Palpation   Palpation comment  tightness in the fascia of the suprapubic surgical side; decreased tissue mobility of the abdominals, right lumbar paraspinals and diaphgram;       Special Tests     Special Tests  Hip Special Tests    Hip Special Tests   Trendelenberg Test      Trendelenburg Test   Findings  Positive    Side  Left    Comments  right hip drops                Objective measurements completed on examination: See above findings.    Pelvic Floor Special Questions - 06/13/17 0001    Prior Pregnancies  Yes    Number of Pregnancies  2    Number of Vaginal Deliveries  2    Episiotomy Performed  Yes    Currently Sexually Active  Yes    Is this Painful  No    Marinoff Scale  no problems    Urinary Leakage  Yes    Activities that cause leaking  Running hopping    Urinary urgency  Yes key in door    Fecal incontinence  Yes    Skin Integrity  Intact    External Palpation  unable to bring anus upward with contracting the left gluteal    Pelvic Floor Internal Exam  Patient confirms identification and approves PT to assess the anal sphincter and treatment    Exam Type  Rectal    Palpation  thickness on the posterior anal canal, decreased movement of the puborectalis, can contract the pelvic floor with tactile cues to pull up    Strength  Flicker               PT Education - 06/13/17 1400    Education Details  pelvic floor contraction in sidely    Methods  Explanation;Demonstration;Verbal cues;Handout    Comprehension  Verbalized understanding;Returned demonstration       PT Short Term Goals - 06/13/17 1514      PT SHORT TERM GOAL #1   Title  improved consistency of bowel movements due to regular meals, fiber, and abdominal massage techniques    Time  4    Period  Weeks    Status  New    Target Date  07/11/17      PT SHORT TERM GOAL #2   Title  able to bulge pelvic floor due to increased muscle length  Time  4    Period  Weeks    Status  On-going    Target Date  07/18/17      PT SHORT TERM GOAL #3   Title  able to perform initial HEP correctly for improved bowel control    Time  4    Period  Weeks    Status  New    Target Date   07/11/17      PT SHORT TERM GOAL #4   Title  ability to isolate pelvic floor contraction without contracting the left gluteal.     Time  4    Period  Weeks    Status  New    Target Date  07/11/17        PT Long Term Goals - 06/13/17 1518      PT LONG TERM GOAL #1   Title  ability to walk for 30  minutes without bowel leakage for 2 weeks due to increased strength and breathing coordination    Time  12    Period  Weeks    Status  New    Target Date  09/05/17      PT LONG TERM GOAL #2   Title  pt independent with advanced HEP    Time  12    Period  Weeks    Status  New    Target Date  09/05/17      PT LONG TERM GOAL #3   Title  able to hold pelvic floor contraction of 3/5 for 10 seconds due to improved muscle function and endurance    Time  12    Period  Months    Status  New    Target Date  09/05/17      PT LONG TERM GOAL #4   Title  Pt demonstrates improved bowel control with no bowel leakage for 2 weeks due to improved strength and tissue mobility of the rectum    Time  12    Period  Weeks    Status  New    Target Date  09/05/17      PT LONG TERM GOAL #5   Title  reports stool is quarter size diameter consistently due to increased abilty for anal sphincter muscles to relax    Time  12    Period  Weeks    Status  New    Target Date  09/05/17             Plan - 06/13/17 1401    Clinical Impression Statement  Patient is a 67 year old female s/p rectal cancer T1N0 s/p TEM resection 02/29/2016. She will pass small nuggets of stool especially when she is walking . Patient reports her stools are skinny and short 3 times per day and 10 times per day pass nuggets.  Sometimes the nuggets will get stuck in the rectum.  Pelvic floor strength is 1/5 and will contract the left gluteal when contracting the anal sphincter.  After several tries patient was able to isolate the anal sphincter for 1-2 seconds. Patient has thickness along the posterior rectum by the puborectalis.   The puborectalis has decreased mobilty. Patient has tightness in the suprapubic area, right lumbar and diaphgram.  Bilateral hip abduction 4-/5 and extension 4/5.  Trendelenberg on the left.  Patient will benefit from skilled therapy to improve pelvic floor mobility, strength and coordination while improving tissue mobilty.      History and Personal Factors relevant to plan of care:  Rectal cancer T1N0 s/p TEM 02/29/2016; s/p laparoscopically assisted low anterior resection 12/02/2010    Clinical Presentation  Evolving    Clinical Presentation due to:  unable to walk due to leakage     Clinical Decision Making  Moderate    Rehab Potential  Excellent    Clinical Impairments Affecting Rehab Potential  Rectal cancer T1N0 s/p TEM 02/29/2016; s/p laparoscopically assisted low anterior resection 12/02/2010    PT Frequency  1x / week    PT Duration  12 weeks    PT Treatment/Interventions  Biofeedback;Therapeutic activities;Therapeutic exercise;Neuromuscular re-education;Patient/family education;Scar mobilization;Manual techniques;Passive range of motion;Dry needling    PT Next Visit Plan  pelvic floor EMG; abdominal massage; toileting; hip stretches    Consulted and Agree with Plan of Care  Patient       Patient will benefit from skilled therapeutic intervention in order to improve the following deficits and impairments:  Increased fascial restricitons, Decreased coordination, Decreased scar mobility, Decreased mobility, Increased muscle spasms, Decreased range of motion, Decreased endurance, Decreased activity tolerance, Decreased strength  Visit Diagnosis: Muscle weakness (generalized)  Unspecified lack of coordination  Cramp and spasm     Problem List Patient Active Problem List   Diagnosis Date Noted  . Unilateral primary osteoarthritis, left knee 03/07/2017  . Chronic pain of left knee 12/20/2016  . Family history of colon cancer   . Family history of breast cancer   . Family history of  prostate cancer   . Adenomatous rectal polyp s/p TEM resection 02/29/2016 02/29/2016  . Pelvic pain 05/11/2011  . Polyuria 05/11/2011  . Bowel habit changes 05/11/2011  . Heartburn 02/06/2011  . Postoperative anemia 01/12/2011  . Insomnia 12/15/2010  . Rectal cancer, T1N0 s/p LAR 12/02/2010 11/01/2010  . Family history of malignant neoplasm of gastrointestinal tract 09/02/2010    Earlie Counts, PT 06/13/17 4:51 PM   [facility] [address] [place], [place], [address] Phone: [phone]   Fax:  [phone]  Name: Natasha Campbell MRN: [id number] Date of Birth: [birth year]

## 2017-06-13 NOTE — Patient Instructions (Addendum)
Slow Contraction: Gravity Eliminated (Side-Lying)    Lie on left side, hips and knees slightly bent. Slowly squeeze pelvic floor for __2_ seconds. Rest for _10__ seconds. Repeat _5__ times. Do _3__ times a day.   Copyright  VHI. All rights reserved.  [facility] [address], [place], [place] [address] Phone # [phone] Fax [phone]

## 2017-06-18 ENCOUNTER — Inpatient Hospital Stay: Payer: BLUE CROSS/BLUE SHIELD | Attending: Genetic Counselor | Admitting: Genetics

## 2017-06-18 DIAGNOSIS — Z8042 Family history of malignant neoplasm of prostate: Secondary | ICD-10-CM

## 2017-06-18 DIAGNOSIS — Z803 Family history of malignant neoplasm of breast: Secondary | ICD-10-CM | POA: Diagnosis not present

## 2017-06-18 DIAGNOSIS — C20 Malignant neoplasm of rectum: Secondary | ICD-10-CM

## 2017-06-18 DIAGNOSIS — Z7183 Encounter for nonprocreative genetic counseling: Secondary | ICD-10-CM

## 2017-06-18 DIAGNOSIS — Z800 Family history of malignant neoplasm of digestive organs: Secondary | ICD-10-CM | POA: Diagnosis not present

## 2017-06-19 ENCOUNTER — Ambulatory Visit: Payer: BLUE CROSS/BLUE SHIELD | Admitting: Physical Therapy

## 2017-06-19 ENCOUNTER — Encounter: Payer: Self-pay | Admitting: Genetics

## 2017-06-19 DIAGNOSIS — R279 Unspecified lack of coordination: Secondary | ICD-10-CM

## 2017-06-19 DIAGNOSIS — R252 Cramp and spasm: Secondary | ICD-10-CM

## 2017-06-19 DIAGNOSIS — M6281 Muscle weakness (generalized): Secondary | ICD-10-CM

## 2017-06-19 NOTE — Progress Notes (Signed)
REFERRING PROVIDER: Michael Boston, MD [address] [place], [place] [address]   PRIMARY PROVIDER:  Velna Hatchet, MD  PRIMARY REASON FOR VISIT:  1. Rectal cancer, T1N0 s/p LAR 12/02/2010   2. Family history of prostate cancer   3. Family history of breast cancer   4. Family history of colon cancer     HISTORY OF PRESENT ILLNESS:   Natasha Campbell, a 67 y.o. female, was seen for a [facility] cancer genetics consultation at the request of Dr. Johney Maine due to a personal and family history of cancer.  Natasha Campbell presents to clinic today to discuss the possibility of a hereditary predisposition to cancer, genetic testing, and to further clarify her future cancer risks, as well as potential cancer risks for family members.   In 2012, at the age of 13, Natasha Campbell was diagnosed with stage 1 colorectal cancer.  She had a laparoscopic low anterior resection.   In 2018, at the age of 73, Natasha Campbell was again diagnosed with colorectal cancer.  This was treated again with surgical resection. MSI and IHC were performed on the most recent cancer and were negative.  She has had about 5 adenomatous colon polyps since 2012.   Natasha Campbell has had 3 neurofibromas removed.  She has had several skin lesions removed, none cancerous.  She does not report any large cafe-au-lait spots- she may have a small one on her arm. Natasha Campbell denies any axillary freckling or lisch nodules she is aware of.  She reports she has never been evaluated/worked up for NF1.   HORMONAL RISK FACTORS:  Menarche was at age 67.  First live birth at age 89.  OCP use for approximately 3 monts  Ovaries intact: yes.  Hysterectomy: no.  Menopausal status: postmenopausal.  HRT use: 0 years. Colonoscopy: yes; abnormal.  Past Medical History:  Diagnosis Date  . Allergy   . Arthritis    hands  . Blood in stool    history  . Blood transfusion without reported diagnosis    self reported  . Chronic headaches   . Chronic kidney  disease 2014   20% kidney function failure per patient  acute episode per patient  . Family history of breast cancer   . Family history of colon cancer   . Family history of colon cancer   . Family history of malignant neoplasm of gastrointestinal tract   . Family history of prostate cancer   . Family history of prostate cancer   . Headache(784.0)    migraines  . Insomnia   . Rectal cancer, T1N0 s/p LAR 12/02/2010 11/01/2010    Past Surgical History:  Procedure Laterality Date  . COLON RESECTION  12/02/2010   Procedure: COLON RESECTION LAPAROSCOPIC;  Surgeon: Adin Hector, MD;  Location: [facility];  Service: General;  Laterality: N/A;  Laparoscopic Low Anterior Resection, Rigid Proctoscopy  . COLON SURGERY    . COLONOSCOPY     multiple d/t rectal cancer dx  . PARTIAL PROCTECTOMY BY TEM N/A 02/29/2016   Procedure: PARTIAL PROCTECTOMY BY TEM OF RECTAL MASS ERAS PATHWAY;  Surgeon: Michael Boston, MD;  Location: [facility];  Service: General;  Laterality: N/A;  . POLYPECTOMY    . TONSILLECTOMY AND ADENOIDECTOMY     age 64    Social History   Socioeconomic History  . Marital status: Married    Spouse name: Not on file  . Number of children: 2  . Years of education: Not on file  .  Highest education level: Not on file  Occupational History    Employer: [employer]  . Financial resource strain: Not on file  . Food insecurity:    Worry: Not on file    Inability: Not on file  . Transportation needs:    Medical: Not on file    Non-medical: Not on file  Tobacco Use  . Smoking status: Never Smoker  . Smokeless tobacco: Never Used  Substance and Sexual Activity  . Alcohol use: Yes    Alcohol/week: 0.0 oz    Comment: seldom  . Drug use: No  . Sexual activity: Yes    Birth control/protection: Post-menopausal  Lifestyle  . Physical activity:    Days per week: Not on file    Minutes per session: Not on file  . Stress: Not on file  Relationships  . Social connections:     Talks on phone: Not on file    Gets together: Not on file    Attends religious service: Not on file    Active member of club or organization: Not on file    Attends meetings of clubs or organizations: Not on file    Relationship status: Not on file  Other Topics Concern  . Not on file  Social History Narrative  . Not on file     FAMILY HISTORY:  We obtained a detailed, 4-generation family history.  Significant diagnoses are listed below:  Family History  Problem Relation Age of Onset  . Heart disease Mother   . Heart disease Father        pacemaker  . Skin cancer Father        basal cell  . Colon cancer Paternal Grandmother 23  . Colon cancer Paternal Grandfather 73  . Colon cancer Maternal Grandmother 35  . Breast cancer Cousin 55       sarcoma of the chest wall  . Prostate cancer Cousin 50       metastatic  . Stomach cancer Neg Hx   . Colon polyps Neg Hx   . Rectal cancer Neg Hx   . Esophageal cancer Neg Hx    Natasha Campbell has a daughter who is 7 and a son who is 3 with no history of cancer. Natasha Campbell has 2 sisters ages 20 and 46 with no history of cancer.    Natasha Campbell father: died at 85, he had basal cell skin cancer.  Paternal Aunts/Uncles: none Paternal cousins: none Paternal grandfather: died of colon cancer at 66 Paternal grandmother:died of colon cancer at 55  Natasha Campbell's mother: 14, no history of cancer.  She had a hysterectomy.   Maternal Aunts/Uncles: 1 maternal uncle died at 48 with no history of cancer. 1 paternal aunt is also deceased with no history of cancer.  Maternal cousins: 1 maternal cousin had sarcoma of the chest wall in her 68's.  She reports she may have had a thyroid problem as well. Another maternal cousin died of prostate cancer at 58, that was diagnosed at 7 and metastasized.  Maternal grandfather: unk cause of death Maternal grandmother:died at 47 due to colon cancer.   Natasha Campbell is unaware of previous family history of genetic  testing for hereditary cancer risks. Patient's maternal ancestors are of Caucasian descent, and paternal ancestors are of  descent. There is no reported Ashkenazi Jewish ancestry. There is no known consanguinity.  GENETIC COUNSELING ASSESSMENT: JEZABEL LECKER is a 67 y.o. female with a personal and family history which  is somewhat suggestive of a Hereditary Cancer Predisposition Syndrome. We, therefore, discussed and recommended the following at today's visit.   DISCUSSION: We reviewed the characteristics, features and inheritance patterns of hereditary cancer syndromes. We also discussed genetic testing, including the appropriate family members to test, the process of testing, insurance coverage and turn-around-time for results. We discussed the implications of a negative, positive and/or variant of uncertain significant result. We recommended Ms. Brosious pursue genetic testing for the Multi-Cancer gene panel. The Multi-Cancer Panel offered by [facility] includes sequencing and/or deletion duplication testing of the following 83 genes: ALK, APC, ATM, AXIN2,BAP1,  BARD1, BLM, BMPR1A, BRCA1, BRCA2, BRIP1, CASR, CDC73, CDH1, CDK4, CDKN1B, CDKN1C, CDKN2A (p14ARF), CDKN2A (p16INK4a), CEBPA, CHEK2, CTNNA1, DICER1, DIS3L2, EGFR (c.2369C>T, p.Thr790Met variant only), EPCAM (Deletion/duplication testing only), FH, FLCN, GATA2, GPC3, GREM1 (Promoter region deletion/duplication testing only), HOXB13 (c.251G>A, p.Gly84Glu), HRAS, KIT, MAX, MEN1, MET, MITF (c.952G>A, p.Glu318Lys variant only), MLH1, MSH2, MSH3, MSH6, MUTYH, NBN, NF1, NF2, NTHL1, PALB2, PDGFRA, PHOX2B, PMS2, POLD1, POLE, POT1, PRKAR1A, PTCH1, PTEN, RAD50, RAD51C, RAD51D, RB1, RECQL4, RET, RUNX1, SDHAF2, SDHA (sequence changes only), SDHB, SDHC, SDHD, SMAD4, SMARCA4, SMARCB1, SMARCE1, STK11, SUFU, TERC, TERT, TMEM127, TP53, TSC1, TSC2, VHL, WRN and WT1.   We discussed that only 5-10% of cancers are associated with a Hereditary Cancer Predisposition Syndrome.   The most common hereditary cancer syndrome associated with colon cancer is Lynch Syndrome.  Lynch Syndrome is caused by mutations in the genes: MLH1, MSH2, MSH6, PMS2 and EPCAM.  This syndrome increases the risk for colon, uterine, ovarian and stomach cancers, as well as others.  Families with Lynch Syndrome tend to have multiple family members with these cancers, typically diagnosed under age 54, and diagnoses in multiple generations.    We discussed that there are several other genes that are associated with an increased risk for colon cancer and increased polyp burden (MUTYH, APC, POLE, CHEK2, etc.) We also dicussed that there are many genes that cause many different types of cancer risks.    Ms. Devoss had intact Immuno-histo chemistry (IHC) performed on her most recent cancer.  These proteins were intact, and MSI was stable so the suspicion for Lynch Syndrome is low.  However, there are other genes and synromes associated with an increased risk for colon and other types of cancer.   We discussed that if she is found to have a mutation in one of these genes, it may impact future medical management recommendations such as increased cancer screenings and consideration of risk reducing surgeries.  A positive result could also have implications for the patient's family members.  A Negative result would mean we were unable to identify a hereditary component to her cancer, but does not rule out the possibility of a hereditary basis for her cancer.  There could be mutations that are undetectable by current technology, or in genes not yet tested or identified to increase cancer risk.    We discussed the potential to find a Variant of Uncertain Significance or VUS.  These are variants that have not yet been identified as pathogenic or benign, and it is unknown if this variant is associated with increased cancer risk or if this is a normal finding.  Most VUS's are reclassified to benign or likely benign.   It  should not be used to make medical management decisions. With time, we suspect the lab will determine the significance of any VUS's identified if any.   Based on Ms. Edgell's personal and family history of cancer, she  meets NCCN medical criteria for genetic testing. Despite that she meets criteria, she may still have an out of pocket cost. We discussed that the laboratory will provide her with an estimated OOP cost.   We discussed that some people do not want to undergo genetic testing due to fear of genetic discrimination.  A federal law called the Genetic Information Non-Discrimination Act (GINA) of 2008 helps protect individuals against genetic discrimination based on their genetic test results.  It impacts both health insurance and employment.  For health insurance, it protects against increased premiums, being kicked off insurance or being forced to take a test in order to be insured.  For employment it protects against hiring, firing and promoting decisions based on genetic test results.  Health status due to a cancer diagnosis is not protected under GINA.  This law does not protect life insurance, disability insurance, or other types of insurance.   PLAN: After considering the risks, benefits, and limitations, Ms. Fiallo  provided informed consent to pursue genetic testing and the blood sample was sent to [facility] for analysis of the Multi-Cancer Panel. Results should be available within approximately 2-3 weeks' time, at which point they will be disclosed by telephone to Ms. Luft, as will any additional recommendations warranted by these results. Ms. Canale will receive a summary of her genetic counseling visit and a copy of her results once available. This information will also be available in Epic. We encouraged Ms. Hajduk to remain in contact with cancer genetics annually so that we can continuously update the family history and inform her of any changes in cancer genetics and testing  that may be of benefit for her family. Ms. Amiri questions were answered to her satisfaction today. Our contact information was provided should additional questions or concerns arise.  Based on Ms. Sedam's family history, we recommended her maternal relatives also have genetic counseling and testing due to the history of metastatic prostate cancer. Ms. Rosenstock will let us know if we can be of any assistance in coordinating genetic counseling and/or testing for this family member.   Lastly, we encouraged Ms. Jeff to remain in contact with cancer genetics annually so that we can continuously update the family history and inform her of any changes in cancer genetics and testing that may be of benefit for this family.   Ms.  Fay questions were answered to her satisfaction today. Our contact information was provided should additional questions or concerns arise. Thank you for the referral and allowing [place] to share in the care of your patient.   Tana Felts, MS, [facility] Certified Genetic Counselor [email or web address] phone: [phone]  The patient was seen for a total of 40 minutes in face-to-face genetic counseling. The patient was accompanied today by her husband.

## 2017-06-19 NOTE — Patient Instructions (Addendum)
[facility]  Address: [address], [place], [place] [address] Phone: [phone] Website: [email or web address]  Walk in a store, mall, [place] program at the [facility] on [place], [place] [address] [place]  [address] CONTACT us Call [phone] Email [place] at [email or web address]  ?[email or web address] [place] For more information contact Marlon Pel or Heron Nay at [phone] ext [phone] [email or web address]     About Abdominal Massage  Abdominal massage, also called external colon massage, is a self-treatment circular massage technique that can reduce and eliminate gas and ease constipation. The colon naturally contracts in waves in a clockwise direction starting from inside the right hip, moving up toward the ribs, across the belly, and down inside the left hip.  When you perform circular abdominal massage, you help stimulate your colon's normal wave pattern of movement called peristalsis.  It is most beneficial when done after eating.  Positioning You can practice abdominal massage with oil while lying down, or in the shower with soap.  Some people find that it is just as effective to do the massage through clothing while sitting or standing.  How to Massage Start by placing your finger tips or knuckles on your right side, just inside your hip bone.  . Make small circular movements while you move upward toward your rib cage.   . Once you reach the bottom right side of your rib cage, take your circular movements across to the left side of the bottom of your rib cage.  . Next, move downward until you reach the inside of your left hip bone.  This is the path your feces travel in your colon. . Continue to perform your abdominal massage in this pattern for 10  minutes each day.     You can apply as much pressure as is comfortable in your massage.  Start gently and build pressure as you continue to practice.  Notice any areas of pain as you massage; areas of slight pain may be relieved as you massage, but if you have areas of significant or intense pain, consult with your healthcare provider.  Other Considerations . General physical activity including bending and stretching can have a beneficial massage-like effect on the colon.  Deep breathing can also stimulate the colon because breathing deeply activates the same nervous system that supplies the colon.   . Abdominal massage should always be used in combination with a bowel-conscious diet that is high in the proper type of fiber for you, fluids (primarily water), and a regular exercise program. Toileting Techniques for Bowel Movements (Defecation) Using your belly (abdomen) and pelvic floor muscles to have a bowel movement is usually instinctive.  Sometimes people can have problems with these muscles and have to relearn proper defecation (emptying) techniques.  If you have weakness in your muscles, organs that are falling out, decreased sensation in your pelvis, or ignore your urge to go, you may find yourself straining to have a bowel movement.  You are straining if you are: . holding your breath or taking in a huge gulp of air and holding it  . keeping your lips and jaw tensed and closed tightly . turning red in the face because of excessive pushing or forcing . developing or worsening your  hemorrhoids . getting faint while pushing . not emptying completely and have to defecate many times a day  If you are straining, you are actually making  it harder for yourself to have a bowel movement.  Many people find they are pulling up with the pelvic floor muscles and closing off instead of opening the anus. Due to lack pelvic floor relaxation and coordination the abdominal muscles, one has to work harder to  push the feces out.  Many people have never been taught how to defecate efficiently and effectively.  Notice what happens to your body when you are having a bowel movement.  While you are sitting on the toilet pay attention to the following areas: . Jaw and mouth position . Angle of your hips   . Whether your feet touch the ground or not . Arm placement  . Spine position . Waist . Belly tension . Anus (opening of the anal canal)  An Evacuation/Defecation Plan   Here are the 4 basic points:  1. Lean forward enough for your elbows to rest on your knees 2. Support your feet on the floor or use a low stool if your feet don't touch the floor  3. Push out your belly as if you have swallowed a beach ball-you should feel a widening of your waist 4. Open and relax your pelvic floor muscles, rather than tightening around the anus      The following conditions my require modifications to your toileting posture:  . If you have had surgery in the past that limits your back, hip, pelvic, knee or ankle flexibility . Constipation   Your healthcare practitioner may make the following additional suggestions and adjustments:  1) Sit on the toilet  a) Make sure your feet are supported. b) Notice your hip angle and spine position-most people find it effective to lean forward or raise their knees, which can help the muscles around the anus to relax  c) When you lean forward, place your forearms on your thighs for support  2) Relax suggestions a) Breath deeply in through your nose and out slowly through your mouth as if you are smelling the flowers and blowing out the candles. b) To become aware of how to relax your muscles, contracting and releasing muscles can be helpful.  Pull your pelvic floor muscles in tightly by using the image of holding back gas, or closing around the anus (visualize making a circle smaller) and lifting the anus up and in.  Then release the muscles and your anus should drop  down and feel open. Repeat 5 times ending with the feeling of relaxation. c) Keep your pelvic floor muscles relaxed; let your belly bulge out. d) The digestive tract starts at the mouth and ends at the anal opening, so be sure to relax both ends of the tube.  Place your tongue on the roof of your mouth with your teeth separated.  This helps relax your mouth and will help to relax the anus at the same time.  3) Empty (defecation) a) Keep your pelvic floor and sphincter relaxed, then bulge your anal muscles.  Make the anal opening wide.  b) Stick your belly out as if you have swallowed a beach ball. c) Make your belly wall hard using your belly muscles while continuing to breathe. Doing this makes it easier to open your anus. d) Breath out and give a grunt (or try using other sounds such as ahhhh, shhhhh, ohhhh or grrrrrrr).  4) Finish a) As you finish your bowel movement, pull the pelvic floor muscles up and in.  This will leave your anus in the proper place rather than remaining pushed  out and down. If you leave your anus pushed out and down, it will start to feel as though that is normal and give you incorrect signals about needing to have a bowel movement.    Earlie Counts, PT [facility] [address] [place], [place] [address] Quick Contraction: Gravity Eliminated (Side-Lying)    Lie on left side, hips and knees slightly bent. Quickly squeeze then fully relax pelvic floor. Perform __1_ sets of _5__. Rest for __1_ seconds between sets. Do __3_ times a day.   Copyright  VHI. All rights reserved.  Slow Contraction: Gravity Eliminated (Side-Lying)    Lie on left side, hips and knees slightly bent. Slowly squeeze pelvic floor for _5__ seconds. Rest for _10__ seconds. Repeat _5__ times. Do _3__ times a day.   Copyright  VHI. All rights reserved.  [place] [address], [place], [place] [address] Phone # [phone] Fax  [phone]

## 2017-06-19 NOTE — Therapy (Signed)
[facility] [address] [place], [place], [address] Phone: [phone]   Fax:  [phone]  Physical Therapy Treatment  Patient Details  Name: Natasha Campbell MRN: [id number] Date of Birth: Oct 31, 1950 Referring Provider: Dr. Michael Boston   Encounter Date: 06/19/2017  PT End of Session - 06/19/17 1325    Visit Number  2    Date for PT Re-Evaluation  09/05/17    Authorization Type  BC[employer]    PT Start Time  1145    PT Stop Time  1230    PT Time Calculation (min)  45 min    Activity Tolerance  Patient tolerated treatment well    Behavior During Therapy  [facility] for tasks assessed/performed       Past Medical History:  Diagnosis Date  . Allergy   . Arthritis    hands  . Blood in stool    history  . Blood transfusion without reported diagnosis    self reported  . Chronic headaches   . Chronic kidney disease 2014   20% kidney function failure per patient  acute episode per patient  . Family history of breast cancer   . Family history of colon cancer   . Family history of colon cancer   . Family history of malignant neoplasm of gastrointestinal tract   . Family history of prostate cancer   . Family history of prostate cancer   . Headache(784.0)    migraines  . Insomnia   . Rectal cancer, T1N0 s/p LAR 12/02/2010 11/01/2010    Past Surgical History:  Procedure Laterality Date  . COLON RESECTION  12/02/2010   Procedure: COLON RESECTION LAPAROSCOPIC;  Surgeon: Adin Hector, MD;  Location: [facility];  Service: General;  Laterality: N/A;  Laparoscopic Low Anterior Resection, Rigid Proctoscopy  . COLON SURGERY    . COLONOSCOPY     multiple d/t rectal cancer dx  . PARTIAL PROCTECTOMY BY TEM N/A 02/29/2016   Procedure: PARTIAL PROCTECTOMY BY TEM OF RECTAL MASS ERAS PATHWAY;  Surgeon: Michael Boston, MD;  Location: [facility];  Service: General;  Laterality: N/A;  . POLYPECTOMY    . TONSILLECTOMY AND ADENOIDECTOMY     age 67     There were no vitals filed for this visit.  Subjective Assessment - 06/19/17 1147    Subjective  I have to use my finger to contract the pelvic floor.     Patient Stated Goals  regular bowel movements and not to loose stools    Currently in Pain?  No/denies                    Pelvic Floor Special Questions - 06/19/17 0001    Pelvic Floor Internal Exam  Patient confirms identification and approves PT to assess the anal sphincter and treatment    Exam Type  Rectal    Strength  weak squeeze, no lift with tactile cues in sidely        [facility] Adult PT Treatment/Exercise - 06/19/17 0001      Neuro Re-ed    Neuro Re-ed Details   tactile cues to puborectlis to contract and tactile cues to internal and external sphincter to contract and pull up PT finger      Manual Therapy   Manual Therapy  Internal Pelvic Floor;Soft tissue mobilization;Myofascial release    Soft tissue mobilization  soft tissue work to abdomen in circular massage to promote peristalsis of intestings, diaphgram, lower abdomen  Myofascial Release  myofascial release to abdominal tissue to release the organs    Internal Pelvic Floor  soft tissue work to the puborectalis             PT Education - 06/19/17 1323    Education Details  pelvic floor contraction in sidely; abdominal massage ;toileting technique; information on community resources to exercise    Person(s) Educated  Patient    Methods  Explanation;Demonstration;Verbal cues;Handout    Comprehension  Returned demonstration;Verbalized understanding       PT Short Term Goals - 06/13/17 1514      PT SHORT TERM GOAL #1   Title  improved consistency of bowel movements due to regular meals, fiber, and abdominal massage techniques    Time  4    Period  Weeks    Status  New    Target Date  07/11/17      PT SHORT TERM GOAL #2   Title  able to bulge pelvic floor due to increased muscle length    Time  4    Period  Weeks    Status   On-going    Target Date  07/18/17      PT SHORT TERM GOAL #3   Title  able to perform initial HEP correctly for improved bowel control    Time  4    Period  Weeks    Status  New    Target Date  07/11/17      PT SHORT TERM GOAL #4   Title  ability to isolate pelvic floor contraction without contracting the left gluteal.     Time  4    Period  Weeks    Status  New    Target Date  07/11/17        PT Long Term Goals - 06/13/17 1518      PT LONG TERM GOAL #1   Title  ability to walk for 30  minutes without bowel leakage for 2 weeks due to increased strength and breathing coordination    Time  12    Period  Weeks    Status  New    Target Date  09/05/17      PT LONG TERM GOAL #2   Title  pt independent with advanced HEP    Time  12    Period  Weeks    Status  New    Target Date  09/05/17      PT LONG TERM GOAL #3   Title  able to hold pelvic floor contraction of 3/5 for 10 seconds due to improved muscle function and endurance    Time  12    Period  Months    Status  New    Target Date  09/05/17      PT LONG TERM GOAL #4   Title  Pt demonstrates improved bowel control with no bowel leakage for 2 weeks due to improved strength and tissue mobility of the rectum    Time  12    Period  Weeks    Status  New    Target Date  09/05/17      PT LONG TERM GOAL #5   Title  reports stool is quarter size diameter consistently due to increased abilty for anal sphincter muscles to relax    Time  12    Period  Weeks    Status  New    Target Date  09/05/17  Plan - 06/19/17 1325    Clinical Impression Statement  Patient had increased tissue mobility of puborectalis after manual work.  Patient has tightness in the abdominal tissue.  Patient was able to empty her bowels with greater ease last visit after abdominal work.  Patient is now able to contract the pelvic floor with 5 second contraction compared to  2 second.  Patient will benefit from skilled therapy to improve  pelvic floor mobility, strength and coordination while improving tissue mobility.     Rehab Potential  Excellent    Clinical Impairments Affecting Rehab Potential  Rectal cancer T1N0 s/p TEM 02/29/2016; s/p laparoscopically assisted low anterior resection 12/02/2010    PT Frequency  1x / week    PT Duration  12 weeks    PT Treatment/Interventions  Biofeedback;Therapeutic activities;Therapeutic exercise;Neuromuscular re-education;Patient/family education;Scar mobilization;Manual techniques;Passive range of motion;Dry needling    PT Next Visit Plan  pelvic floor EMG;  hip stretches    Consulted and Agree with Plan of Care  Patient       Patient will benefit from skilled therapeutic intervention in order to improve the following deficits and impairments:  Increased fascial restricitons, Decreased coordination, Decreased scar mobility, Decreased mobility, Increased muscle spasms, Decreased range of motion, Decreased endurance, Decreased activity tolerance, Decreased strength  Visit Diagnosis: Muscle weakness (generalized)  Unspecified lack of coordination  Cramp and spasm     Problem List Patient Active Problem List   Diagnosis Date Noted  . Unilateral primary osteoarthritis, left knee 03/07/2017  . Chronic pain of left knee 12/20/2016  . Family history of colon cancer   . Family history of breast cancer   . Family history of prostate cancer   . Adenomatous rectal polyp s/p TEM resection 02/29/2016 02/29/2016  . Pelvic pain 05/11/2011  . Polyuria 05/11/2011  . Bowel habit changes 05/11/2011  . Heartburn 02/06/2011  . Postoperative anemia 01/12/2011  . Insomnia 12/15/2010  . Rectal cancer, T1N0 s/p LAR 12/02/2010 11/01/2010  . Family history of malignant neoplasm of gastrointestinal tract 09/02/2010    Earlie Counts, PT 06/19/17 1:30 PM   [facility] [address] [place], [place], [address] Phone: [phone]    Fax:  [phone]  Name: DANYETTA GILLHAM MRN: [id number] Date of Birth: [birth year]

## 2017-06-19 NOTE — Addendum Note (Signed)
Addended by: Earlie Counts F on: 06/19/2017 01:33 PM   Modules accepted: Orders

## 2017-06-27 ENCOUNTER — Ambulatory Visit: Payer: BLUE CROSS/BLUE SHIELD | Admitting: Physical Therapy

## 2017-06-27 ENCOUNTER — Encounter: Payer: Self-pay | Admitting: Physical Therapy

## 2017-06-27 DIAGNOSIS — M6281 Muscle weakness (generalized): Secondary | ICD-10-CM | POA: Diagnosis not present

## 2017-06-27 DIAGNOSIS — R252 Cramp and spasm: Secondary | ICD-10-CM

## 2017-06-27 DIAGNOSIS — R279 Unspecified lack of coordination: Secondary | ICD-10-CM

## 2017-06-27 NOTE — Patient Instructions (Addendum)
Access Code: [email or web address]  URL: [email or web address]  Date: 06/27/2017  Prepared by: Earlie Counts   Exercises  Cat Cow - 10 reps - 1 sets - 1 hold - 1x daily - 7x weekly  Child's Pose Stretch - 2 reps - 1 sets - 30 hold - 1x daily - 7x weekly  Child's Pose with Sidebending - 2 reps - 1 sets - 30 hold - 1x daily - 7x weekly  Prone Press Up - 5 reps - 1 sets - 3 hold - 1x daily - 7x weekly  Supine Single Knee to Chest - 2 reps - 1 sets - 30 hold - 1x daily - 7x weekly  Supine Double Knee to Chest - 2 reps - 1 sets - 30 hold - 1x daily - 7x weekly  Supine Figure 4 Piriformis Stretch - 2 reps - 1 sets - 30 hold - 1x daily - 7x weekly  Supine Hamstring Stretch - 2 reps - 1 sets - 30 hold - 1x daily - 7x weekly  [facility] [address], [place], [place] [address] Phone # [phone] Fax [phone]   At home do deep belly breathing laying on your side for 5 minutes to relax the pelvic floor. Slow Contraction: Gravity Eliminated (Side-Lying)    Lie on left side, hips and knees slightly bent. Slowly squeeze pelvic floor for _5__ seconds. Rest for _20__ seconds with eyes closed. Repeat _10__ times. Do _3__ times a day.   Copyright  VHI. All rights reserved.

## 2017-06-27 NOTE — Therapy (Signed)
[facility] [address], [place], [place], [address] Phone: [phone]   Fax:  [phone]  Physical Therapy Treatment  Patient Details  Name: Natasha Campbell MRN: [id number] Date of Birth: 02/09/1950 Referring Provider: Dr. Michael Boston   Encounter Date: 06/27/2017  PT End of Session - 06/27/17 0926    Visit Number  3    Date for PT Re-Evaluation  09/05/17    Authorization Type  BCBS    PT Start Time  0845    PT Stop Time  0927    PT Time Calculation (min)  42 min    Activity Tolerance  Patient tolerated treatment well    Behavior During Therapy  [facility] for tasks assessed/performed       Past Medical History:  Diagnosis Date  . Allergy   . Arthritis    hands  . Blood in stool    history  . Blood transfusion without reported diagnosis    self reported  . Chronic headaches   . Chronic kidney disease 2014   20% kidney function failure per patient  acute episode per patient  . Family history of breast cancer   . Family history of colon cancer   . Family history of colon cancer   . Family history of malignant neoplasm of gastrointestinal tract   . Family history of prostate cancer   . Family history of prostate cancer   . Headache(784.0)    migraines  . Insomnia   . Rectal cancer, T1N0 s/p LAR 12/02/2010 11/01/2010    Past Surgical History:  Procedure Laterality Date  . COLON RESECTION  12/02/2010   Procedure: COLON RESECTION LAPAROSCOPIC;  Surgeon: Adin Hector, MD;  Location: [facility];  Service: General;  Laterality: N/A;  Laparoscopic Low Anterior Resection, Rigid Proctoscopy  . COLON SURGERY    . COLONOSCOPY     multiple d/t rectal cancer dx  . PARTIAL PROCTECTOMY BY TEM N/A 02/29/2016   Procedure: PARTIAL PROCTECTOMY BY TEM OF RECTAL MASS ERAS PATHWAY;  Surgeon: Michael Boston, MD;  Location: [facility];  Service: General;  Laterality: N/A;  . POLYPECTOMY    . TONSILLECTOMY AND ADENOIDECTOMY     age 70     There were no vitals filed for this visit.  Subjective Assessment - 06/27/17 0849    Subjective  I am eating more vegetable and the stool are more soft. I am leaking stool more due to soft consistency of the stool.  I take Metamucil 2 times per day.  I have been doing exercise 30 minutes per day. I do not need to use my finger to see if I am contracting correctly.     Patient Stated Goals  regular bowel movements and not to loose stools    Currently in Pain?  No/denies    Multiple Pain Sites  No                    Pelvic Floor Special Questions - 06/27/17 0001    Biofeedback  resting level started 5 uv then did diaphgramatic breathing to reduce to 2-3 uv; sidely contract 5 sec above 6 uv then relax for 20 seconds to get between 2-3 uv sidely    Biofeedback sensor type  Surface rectum                PT Education - 06/27/17 0911    Education Details  Access Code: [email or web address] ; pelvic floor contraction and relaxation in  sidely    Person(s) Educated  Patient    Methods  Explanation;Demonstration;Verbal cues;Handout    Comprehension  Verbalized understanding;Returned demonstration       PT Short Term Goals - 06/27/17 0930      PT SHORT TERM GOAL #1   Title  improved consistency of bowel movements due to regular meals, fiber, and abdominal massage techniques    Time  4    Period  Weeks    Status  Achieved      PT SHORT TERM GOAL #2   Title  able to bulge pelvic floor due to increased muscle length    Baseline  learning with pelvic EMG    Time  4    Period  Weeks    Status  On-going      PT SHORT TERM GOAL #3   Title  able to perform initial HEP correctly for improved bowel control    Time  4    Period  Weeks    Status  Achieved      PT SHORT TERM GOAL #4   Title  ability to isolate pelvic floor contraction without contracting the left gluteal.     Time  4    Period  Weeks    Status  Achieved        PT Long Term Goals - 06/13/17 1518      PT  LONG TERM GOAL #1   Title  ability to walk for 30  minutes without bowel leakage for 2 weeks due to increased strength and breathing coordination    Time  12    Period  Weeks    Status  New    Target Date  09/05/17      PT LONG TERM GOAL #2   Title  pt independent with advanced HEP    Time  12    Period  Weeks    Status  New    Target Date  09/05/17      PT LONG TERM GOAL #3   Title  able to hold pelvic floor contraction of 3/5 for 10 seconds due to improved muscle function and endurance    Time  12    Period  Months    Status  New    Target Date  09/05/17      PT LONG TERM GOAL #4   Title  Pt demonstrates improved bowel control with no bowel leakage for 2 weeks due to improved strength and tissue mobility of the rectum    Time  12    Period  Weeks    Status  New    Target Date  09/05/17      PT LONG TERM GOAL #5   Title  reports stool is quarter size diameter consistently due to increased abilty for anal sphincter muscles to relax    Time  12    Period  Weeks    Status  New    Target Date  09/05/17            Plan - 06/27/17 4098    Clinical Impression Statement  Patient needs to focus on relaxation after contraction with diaphragmatic breathing for 20 seconds to get the pelvic floor to 2-3uv.  Patient is able to contract for 5 seconds above 6 uv.  Patient is able to do her stretches.  Patient is eating more vegetables to make her stool more soft and increased leakaga.  Patient will benefit from skilled therapy to improve pelvic floor  mobility, strength and coordination while improving tissue mobility.     Rehab Potential  Excellent    Clinical Impairments Affecting Rehab Potential  Rectal cancer T1N0 s/p TEM 02/29/2016; s/p laparoscopically assisted low anterior resection 12/02/2010    PT Frequency  1x / week    PT Duration  12 weeks    PT Treatment/Interventions  Biofeedback;Therapeutic activities;Therapeutic exercise;Neuromuscular re-education;Patient/family  education;Scar mobilization;Manual techniques;Passive range of motion;Dry needling    PT Next Visit Plan  pelvic floor EMG;  abdominal massage    PT Home Exercise Plan  Access Code: [email or web address]     Recommended Other Services  MD signed initial eval    Consulted and Agree with Plan of Care  Patient       Patient will benefit from skilled therapeutic intervention in order to improve the following deficits and impairments:  Increased fascial restricitons, Decreased coordination, Decreased scar mobility, Decreased mobility, Increased muscle spasms, Decreased range of motion, Decreased endurance, Decreased activity tolerance, Decreased strength  Visit Diagnosis: Muscle weakness (generalized)  Unspecified lack of coordination  Cramp and spasm     Problem List Patient Active Problem List   Diagnosis Date Noted  . Unilateral primary osteoarthritis, left knee 03/07/2017  . Chronic pain of left knee 12/20/2016  . Family history of colon cancer   . Family history of breast cancer   . Family history of prostate cancer   . Adenomatous rectal polyp s/p TEM resection 02/29/2016 02/29/2016  . Pelvic pain 05/11/2011  . Polyuria 05/11/2011  . Bowel habit changes 05/11/2011  . Heartburn 02/06/2011  . Postoperative anemia 01/12/2011  . Insomnia 12/15/2010  . Rectal cancer, T1N0 s/p LAR 12/02/2010 11/01/2010  . Family history of malignant neoplasm of gastrointestinal tract 09/02/2010    Earlie Counts, PT 06/27/17 9:31 AM   [facility] [address], [place], [place], [address] Phone: [phone]   Fax:  [phone]  Name: Natasha Campbell MRN: [id number] Date of Birth: [birth year]

## 2017-07-02 ENCOUNTER — Telehealth: Payer: Self-pay | Admitting: Internal Medicine

## 2017-07-03 NOTE — Telephone Encounter (Signed)
Left message for pt to call back.  Pt states she is due for colon in November and goes on [facility] September 1. Pt would like to go ahead and have her colon prior to September 1. Please advise.

## 2017-07-04 ENCOUNTER — Ambulatory Visit: Payer: BLUE CROSS/BLUE SHIELD | Attending: Surgery | Admitting: Physical Therapy

## 2017-07-04 ENCOUNTER — Encounter: Payer: Self-pay | Admitting: Physical Therapy

## 2017-07-04 DIAGNOSIS — M6281 Muscle weakness (generalized): Secondary | ICD-10-CM

## 2017-07-04 DIAGNOSIS — R252 Cramp and spasm: Secondary | ICD-10-CM | POA: Diagnosis present

## 2017-07-04 DIAGNOSIS — R279 Unspecified lack of coordination: Secondary | ICD-10-CM

## 2017-07-04 NOTE — Patient Instructions (Addendum)
Contain Soil-Based Probiotics and Be Shelf-Stable: Make sure your probiotic supplement has soil-based probiotics and is shelf-stable, meaning it doesn't need to be refrigerated. Ideally you want your probiotic to have a shelf-life of 2 years.? High CFU Count: Choose a probiotic brand that has a higher number of probiotics, from at least 40 billion to 75 billion per serving. Latest studies have found the best probiotics contain 51 Billion CFUs and 18 different strains. Strain Diversity: Select a probiotic supplement that has at least 15 or more probiotic strains, such as Bacillus coagulans, Lactobacillus acidophilus, Bifidobacterium and Streptococcus thermophilus strains. Survivability: Look for formulas that are in delayed release capsules such as DRcaps that protects the probiotics from stomach acid; or at least have MAKTrek in their formula. Prebiotics: To support the growth of probiotics once they get into your system, it is essential that prebiotics are also included in the formula.  https://consumers-health.com/probiotics-guide/?msclkid=bb639184 [email or web address]   Getting Into / Out of Bed    Lower self to lie down on one side by raising legs and lowering head at the same time. Use arms to assist moving without twisting. Bend both knees to roll onto back if desired. To sit up, start from lying on side, and use same move-ments in reverse. Keep trunk aligned with legs.   Copyright  VHI. All rights reserved.  Sit to Stand: Phase 3    Sitting, squeeze pelvic floor and hold. Lean trunk forward. Push up on arms and stand up. Relax. Repeat __1_ times.   Copyright  VHI. All rights r Introduction to Bowel Health Diet and daily habits can help you predict when your bowels will move on a regular basis.  The consistency and quantity of the stool is usually more important than the frequency.  The goal is to have a regular bowel movement that is soft but formed.   Tips on Emptying  Regularly . Eat breakfast.  Usually the best time of day for a bowel movement will be a half hour to an hour after eating.  These times are best because the body uses the gastrocolic reflex, a stimulation of bowel motion that occurs with eating, to help produce a bowel movement.  For some people even a simple hot drink in the morning can help the reflex action begin. . Eat all your meals at a predictable time each day.  The bowel functions best when food is introduced at the same regular intervals. . The amount of food eaten at a given time of day should be about the same size from day to day.  The bowel functions best when food is introduced in similar quantities from day to day. It is fine to have a small breakfast and a large lunch, or vice versa, just be consistent. . Eat two servings of fruit or vegetables and at least one serving of a complex carbohydrates (whole grains such as brown rice, bran, whole wheat bread, or oatmeal) at each meal. . Drink plenty of water-ideally eight glasses a day.  Be sure to increase your water intake if you are increasing fiber into your diet.  Maintain Healthy Habits . Exercise daily.  You may exercise at any time of day, but you may find that bowel function is helped most if the exercise is at a consistent time each day. . Make sure that you are not rushed and have convenient access to a bathroom at your selected time to empty your bowels.    [facility] [address] [place],  [place] [address] Phone # [phone] Fax [phone]

## 2017-07-04 NOTE — Therapy (Signed)
[facility] [address] [place], [place], [address] Phone: [phone]   Fax:  [phone]  Physical Therapy Treatment  Patient Details  Name: Natasha Campbell MRN: [id number] Date of Birth: 05-Jan-1950 Referring Provider: Dr. Michael Boston   Encounter Date: 07/04/2017  PT End of Session - 07/04/17 0848    Visit Number  4    Date for PT Re-Evaluation  09/05/17    Authorization Type  BCBS    PT Start Time  0800    PT Stop Time  0845    PT Time Calculation (min)  45 min    Activity Tolerance  Patient tolerated treatment well    Behavior During Therapy  [facility] for tasks assessed/performed       Past Medical History:  Diagnosis Date  . Allergy   . Arthritis    hands  . Blood in stool    history  . Blood transfusion without reported diagnosis    self reported  . Chronic headaches   . Chronic kidney disease 2014   20% kidney function failure per patient  acute episode per patient  . Family history of breast cancer   . Family history of colon cancer   . Family history of colon cancer   . Family history of malignant neoplasm of gastrointestinal tract   . Family history of prostate cancer   . Family history of prostate cancer   . Headache(784.0)    migraines  . Insomnia   . Rectal cancer, T1N0 s/p LAR 12/02/2010 11/01/2010    Past Surgical History:  Procedure Laterality Date  . COLON RESECTION  12/02/2010   Procedure: COLON RESECTION LAPAROSCOPIC;  Surgeon: Adin Hector, MD;  Location: [facility];  Service: General;  Laterality: N/A;  Laparoscopic Low Anterior Resection, Rigid Proctoscopy  . COLON SURGERY    . COLONOSCOPY     multiple d/t rectal cancer dx  . PARTIAL PROCTECTOMY BY TEM N/A 02/29/2016   Procedure: PARTIAL PROCTECTOMY BY TEM OF RECTAL MASS ERAS PATHWAY;  Surgeon: Michael Boston, MD;  Location: [facility];  Service: General;  Laterality: N/A;  . POLYPECTOMY    . TONSILLECTOMY AND ADENOIDECTOMY     age 20     There were no vitals filed for this visit.  Subjective Assessment - 07/04/17 0804    Subjective  My bowel movements are worse.  I am having fecal leakage.     Patient Stated Goals  regular bowel movements and not to loose stools    Currently in Pain?  No/denies    Multiple Pain Sites  No                    Pelvic Floor Special Questions - 07/04/17 0001    Biofeedback  resting tone originally 5 uv but with diaphgramatic breathing gets to 2 uv, contrat 5 seconds at 12 uv but takes 20 seconds to relax to 2 uv; pelvic floor contraction with transitional movement foting from sit to stand,  sidely    Biofeedback sensor type  Surface rectum        [facility] Adult PT Treatment/Exercise - 07/04/17 0001      Self-Care   Self-Care  Other Self-Care Comments    Other Self-Care Comments   information on probiotics for bowel health, information on bowel health             PT Education - 07/04/17 0844    Education Details  information on bowel health,  information on probiotics, transitional movements with pelvic floor contraction and correct breathing    Person(s) Educated  Patient    Methods  Explanation;Demonstration;Verbal cues;Handout    Comprehension  Returned demonstration;Verbalized understanding       PT Short Term Goals - 06/27/17 0930      PT SHORT TERM GOAL #1   Title  improved consistency of bowel movements due to regular meals, fiber, and abdominal massage techniques    Time  4    Period  Weeks    Status  Achieved      PT SHORT TERM GOAL #2   Title  able to bulge pelvic floor due to increased muscle length    Baseline  learning with pelvic EMG    Time  4    Period  Weeks    Status  On-going      PT SHORT TERM GOAL #3   Title  able to perform initial HEP correctly for improved bowel control    Time  4    Period  Weeks    Status  Achieved      PT SHORT TERM GOAL #4   Title  ability to isolate pelvic floor contraction without contracting the left  gluteal.     Time  4    Period  Weeks    Status  Achieved        PT Long Term Goals - 07/04/17 0849      PT LONG TERM GOAL #1   Title  ability to walk for 30  minutes without bowel leakage for 2 weeks due to increased strength and breathing coordination    Time  12    Period  Weeks    Status  On-going      PT LONG TERM GOAL #2   Title  pt independent with advanced HEP    Time  12    Period  Weeks    Status  On-going      PT LONG TERM GOAL #3   Title  able to hold pelvic floor contraction of 3/5 for 10 seconds due to improved muscle function and endurance    Time  12    Period  Months    Status  On-going      PT LONG TERM GOAL #4   Title  Pt demonstrates improved bowel control with no bowel leakage for 2 weeks due to improved strength and tissue mobility of the rectum    Time  12    Period  Weeks    Status  On-going      PT LONG TERM GOAL #5   Title  reports stool is quarter size diameter consistently due to increased abilty for anal sphincter muscles to relax    Time  12    Period  Weeks    Status  On-going            Plan - 07/04/17 0809    Clinical Impression Statement  Patient is now able to contract to 12 uv instead of 6 uv for 5 seconds.   Patient is exercising in sidely and needs to take 20 seconds to relax to 2 uv.  Patient has increased awareness of contracting the anus instead of the gluteals. Patient stools are soft now instead of hard pellets but continues to have leakage. Patient will benefi tfrom skilled therapy to improve pelvic floor mobility, strength and coordination while improving tissue mobility.     Rehab Potential  Excellent    Clinical  Impairments Affecting Rehab Potential  Rectal cancer T1N0 s/p TEM 02/29/2016; s/p laparoscopically assisted low anterior resection 12/02/2010    PT Frequency  1x / week    PT Duration  12 weeks    PT Treatment/Interventions  Biofeedback;Therapeutic activities;Therapeutic exercise;Neuromuscular  re-education;Patient/family education;Scar mobilization;Manual techniques;Passive range of motion;Dry needling    PT Next Visit Plan  pelvic floor EMG in sidely then sitting;  abdominal massage    Consulted and Agree with Plan of Care  Patient       Patient will benefit from skilled therapeutic intervention in order to improve the following deficits and impairments:  Increased fascial restricitons, Decreased coordination, Decreased scar mobility, Decreased mobility, Increased muscle spasms, Decreased range of motion, Decreased endurance, Decreased activity tolerance, Decreased strength  Visit Diagnosis: Muscle weakness (generalized)  Unspecified lack of coordination  Cramp and spasm     Problem List Patient Active Problem List   Diagnosis Date Noted  . Unilateral primary osteoarthritis, left knee 03/07/2017  . Chronic pain of left knee 12/20/2016  . Family history of colon cancer   . Family history of breast cancer   . Family history of prostate cancer   . Adenomatous rectal polyp s/p TEM resection 02/29/2016 02/29/2016  . Pelvic pain 05/11/2011  . Polyuria 05/11/2011  . Bowel habit changes 05/11/2011  . Heartburn 02/06/2011  . Postoperative anemia 01/12/2011  . Insomnia 12/15/2010  . Rectal cancer, T1N0 s/p LAR 12/02/2010 11/01/2010  . Family history of malignant neoplasm of gastrointestinal tract 09/02/2010    Earlie Counts, PT 07/04/17 8:55 AM   [facility] [address] [place], [place], [address] Phone: [phone]   Fax:  [phone]  Name: Natasha Campbell MRN: [id number] Date of Birth: 23-Mar-1950

## 2017-07-07 NOTE — Telephone Encounter (Signed)
[facility] for surveillance colon before Sept 1, 2019

## 2017-07-09 NOTE — Telephone Encounter (Signed)
Please see note below from Dr. Hilarie Fredrickson and schedule pt for colon.

## 2017-07-09 NOTE — Telephone Encounter (Signed)
Called and scheduled patient for 09-17-17, which is next available and I placed her on the cancellation list. She wanted me to send a note to Dr. Hilarie Fredrickson and let him know how "critical" it is that she have her procedure before September 1st before her insurance switches to Medicare.

## 2017-07-09 NOTE — Telephone Encounter (Signed)
There is an opening 07/13/17@9am . Could she do that?

## 2017-07-09 NOTE — Telephone Encounter (Signed)
Called and LM on Vmail to call back

## 2017-07-10 ENCOUNTER — Other Ambulatory Visit: Payer: Self-pay

## 2017-07-10 ENCOUNTER — Ambulatory Visit (AMBULATORY_SURGERY_CENTER): Payer: Self-pay | Admitting: *Deleted

## 2017-07-10 VITALS — Ht 65.75 in | Wt 148.2 lb

## 2017-07-10 DIAGNOSIS — Z85048 Personal history of other malignant neoplasm of rectum, rectosigmoid junction, and anus: Secondary | ICD-10-CM

## 2017-07-10 MED ORDER — SUPREP BOWEL PREP KIT 17.5-3.13-1.6 GM/177ML PO SOLN: 1.0000 | Freq: Once | ORAL | 0 refills | Status: AC

## 2017-07-10 NOTE — Progress Notes (Signed)
Patient denies any allergies to egg or soy products. Patient denies complications with anesthesia/sedation.  Patient denies oxygen use at home and denies diet medications. Patient denied information on colonoscopy.

## 2017-07-11 ENCOUNTER — Encounter: Payer: Self-pay | Admitting: Genetics

## 2017-07-11 ENCOUNTER — Ambulatory Visit: Payer: BLUE CROSS/BLUE SHIELD | Admitting: Physical Therapy

## 2017-07-11 ENCOUNTER — Encounter: Payer: Self-pay | Admitting: Physical Therapy

## 2017-07-11 ENCOUNTER — Ambulatory Visit: Payer: Self-pay | Admitting: Genetics

## 2017-07-11 DIAGNOSIS — M6281 Muscle weakness (generalized): Secondary | ICD-10-CM | POA: Diagnosis not present

## 2017-07-11 DIAGNOSIS — R279 Unspecified lack of coordination: Secondary | ICD-10-CM

## 2017-07-11 DIAGNOSIS — C20 Malignant neoplasm of rectum: Secondary | ICD-10-CM

## 2017-07-11 DIAGNOSIS — Z803 Family history of malignant neoplasm of breast: Secondary | ICD-10-CM

## 2017-07-11 DIAGNOSIS — R252 Cramp and spasm: Secondary | ICD-10-CM

## 2017-07-11 DIAGNOSIS — Z1379 Encounter for other screening for genetic and chromosomal anomalies: Secondary | ICD-10-CM

## 2017-07-11 DIAGNOSIS — Z800 Family history of malignant neoplasm of digestive organs: Secondary | ICD-10-CM

## 2017-07-11 DIAGNOSIS — Z8042 Family history of malignant neoplasm of prostate: Secondary | ICD-10-CM

## 2017-07-11 NOTE — Therapy (Signed)
[facility] [address] [place], [place], [address] Phone: [phone]   Fax:  [phone]  Physical Therapy Treatment  Patient Details  Name: Natasha Campbell MRN: [id number] Date of Birth: 1950/10/16 Referring Provider: Dr. Michael Boston   Encounter Date: 07/11/2017  PT End of Session - 07/11/17 0927    Visit Number  5    Date for PT Re-Evaluation  09/05/17    Authorization Type  BCBS    PT Start Time  0845    PT Stop Time  0927    PT Time Calculation (min)  42 min    Activity Tolerance  Patient tolerated treatment well    Behavior During Therapy  [facility] for tasks assessed/performed       Past Medical History:  Diagnosis Date  . Allergy   . Arthritis    hands  . Blood in stool    history  . Blood transfusion without reported diagnosis 2012   with colon surgery  . Chronic headaches   . Chronic kidney disease 2014   20% kidney function failure per patient  acute episode per patient,   . Family history of breast cancer   . Family history of colon cancer   . Family history of colon cancer   . Family history of malignant neoplasm of gastrointestinal tract   . Family history of prostate cancer   . Family history of prostate cancer   . Headache(784.0)    migraines  . Insomnia   . Rectal cancer, T1N0 s/p LAR 12/02/2010 11/01/2010    Past Surgical History:  Procedure Laterality Date  . COLON RESECTION  12/02/2010   Procedure: COLON RESECTION LAPAROSCOPIC;  Surgeon: Adin Hector, MD;  Location: [facility];  Service: General;  Laterality: N/A;  Laparoscopic Low Anterior Resection, Rigid Proctoscopy  . COLON SURGERY  02/2016   rectal cancer  . COLONOSCOPY     multiple d/t rectal cancer dx  . PARTIAL PROCTECTOMY BY TEM N/A 02/29/2016   Procedure: PARTIAL PROCTECTOMY BY TEM OF RECTAL MASS ERAS PATHWAY;  Surgeon: Michael Boston, MD;  Location: [facility];  Service: General;  Laterality: N/A;  . POLYPECTOMY    .  TONSILLECTOMY AND ADENOIDECTOMY     age 67  . WISDOM TOOTH EXTRACTION      There were no vitals filed for this visit.  Subjective Assessment - 07/11/17 0849    Subjective  I have been bad this week.  I was out of town and trying to get back on schedule.  I got the stool and it is helping. I am having a colonoscopy on Friday. My insurance changes in 1 month.     Patient Stated Goals  regular bowel movements and not to loose stools    Currently in Pain?  No/denies    Multiple Pain Sites  No                    Pelvic Floor Special Questions - 07/11/17 0001    Biofeedback  sidely resting tone 2 uv; quick flick 40 uv then takes 3 seconds to fully relax to 2 uv; contract 5 seconds to 12 uv; Session focused on contracting and fully relaxing    Biofeedback sensor type  Surface rectum        [facility] Adult PT Treatment/Exercise - 07/11/17 0001      Manual Therapy   Manual Therapy  Soft tissue mobilization    Soft tissue mobilization  circular massage  around the abdoment, along the midline on the abdomen, around the umbilicus, along the lower abdomen  where the scar is               PT Short Term Goals - 06/27/17 0930      PT SHORT TERM GOAL #1   Title  improved consistency of bowel movements due to regular meals, fiber, and abdominal massage techniques    Time  4    Period  Weeks    Status  Achieved      PT SHORT TERM GOAL #2   Title  able to bulge pelvic floor due to increased muscle length    Baseline  learning with pelvic EMG    Time  4    Period  Weeks    Status  On-going      PT SHORT TERM GOAL #3   Title  able to perform initial HEP correctly for improved bowel control    Time  4    Period  Weeks    Status  Achieved      PT SHORT TERM GOAL #4   Title  ability to isolate pelvic floor contraction without contracting the left gluteal.     Time  4    Period  Weeks    Status  Achieved        PT Long Term Goals - 07/04/17 0849      PT LONG TERM  GOAL #1   Title  ability to walk for 30  minutes without bowel leakage for 2 weeks due to increased strength and breathing coordination    Time  12    Period  Weeks    Status  On-going      PT LONG TERM GOAL #2   Title  pt independent with advanced HEP    Time  12    Period  Weeks    Status  On-going      PT LONG TERM GOAL #3   Title  able to hold pelvic floor contraction of 3/5 for 10 seconds due to improved muscle function and endurance    Time  12    Period  Months    Status  On-going      PT LONG TERM GOAL #4   Title  Pt demonstrates improved bowel control with no bowel leakage for 2 weeks due to improved strength and tissue mobility of the rectum    Time  12    Period  Weeks    Status  On-going      PT LONG TERM GOAL #5   Title  reports stool is quarter size diameter consistently due to increased abilty for anal sphincter muscles to relax    Time  12    Period  Weeks    Status  On-going            Plan - 07/11/17 2951    Clinical Impression Statement  Start with 2 uv  instead of 5 uv in sidely for resting tone. Patient can contract above 8 uv and keep it above for 5 seconds but difficulty with relaxing in sidely. Patient is able to laugh and contract the pelvic floor and fully relax right away.  Patient will be having a colonscopy on 07/13/2017 so no changes to HEP.  Patient will benefit from skilled therapy to improve pelvic floor mobility, strength, and coordination while improving tissue mobility .     Rehab Potential  Excellent    Clinical Impairments Affecting  Rehab Potential  Rectal cancer T1N0 s/p TEM 02/29/2016; s/p laparoscopically assisted low anterior resection 12/02/2010    PT Frequency  1x / week    PT Duration  12 weeks    PT Treatment/Interventions  Biofeedback;Therapeutic activities;Therapeutic exercise;Neuromuscular re-education;Patient/family education;Scar mobilization;Manual techniques;Passive range of motion;Dry needling    PT Next Visit Plan   pelvic floor EMG in sidely then sitting;  abdominal massage; core strength    PT Home Exercise Plan  Access Code: [email or web address]     Consulted and Agree with Plan of Care  Patient       Patient will benefit from skilled therapeutic intervention in order to improve the following deficits and impairments:  Increased fascial restricitons, Decreased coordination, Decreased scar mobility, Decreased mobility, Increased muscle spasms, Decreased range of motion, Decreased endurance, Decreased activity tolerance, Decreased strength  Visit Diagnosis: Muscle weakness (generalized)  Unspecified lack of coordination  Cramp and spasm     Problem List Patient Active Problem List   Diagnosis Date Noted  . Unilateral primary osteoarthritis, left knee 03/07/2017  . Chronic pain of left knee 12/20/2016  . Family history of colon cancer   . Family history of breast cancer   . Family history of prostate cancer   . Adenomatous rectal polyp s/p TEM resection 02/29/2016 02/29/2016  . Pelvic pain 05/11/2011  . Polyuria 05/11/2011  . Bowel habit changes 05/11/2011  . Heartburn 02/06/2011  . Postoperative anemia 01/12/2011  . Insomnia 12/15/2010  . Rectal cancer, T1N0 s/p LAR 12/02/2010 11/01/2010  . Family history of malignant neoplasm of gastrointestinal tract 09/02/2010    Earlie Counts, PT 07/11/17 9:28 AM   [facility] [address] [place], [place], [address] Phone: [phone]   Fax:  [phone]  Name: Natasha Campbell MRN: [id number] Date of Birth: February 10, 1950

## 2017-07-11 NOTE — Progress Notes (Signed)
HPI:  Natasha Campbell was previously seen in the [place] clinic on 06/18/2017 due to a personal and family history of colon cancer and concerns regarding a hereditary predisposition to cancer. Please refer to our prior cancer genetics clinic note for more information regarding Ms. Camey's medical, social and family histories, and our assessment and recommendations, at the time. Ms. Heider recent genetic test results were disclosed to her, as well as recommendations warranted by these results. These results and recommendations are discussed in more detail below.  CANCER HISTORY:    Rectal cancer, T1N0 s/p LAR 12/02/2010   11/01/2010 Initial Diagnosis    Rectal cancer, T1N0 s/p LAR 12/02/2010      07/02/2017 Genetic Testing    The Multi-Cancer Panel + Colorectal Panel (with preliminary evidence colon risk gene) was ordered (86 genes). The following genes were evaluated for sequence changes and exonic deletions/duplications: ALK, APC, ATM, AXIN2, BAP1, BARD1, BLM, BMPR1A, BRCA1, BRCA2, BRIP1, CASR, CDC73, CDH1, CDK4, CDKN1B, CDKN1C, CDKN2A (p14ARF), CDKN2A (p16INK4a), CEBPA, CHEK2, CTNNA1, DICER1, DIS3L2, EPCAM*, FH, FLCN, GALNT12, GATA2, GPC3, GREM1*, HRAS, KIT, MAX, MEN1, MET, MLH1, MLH3, MSH2, MSH3, MSH6, MUTYH, NBN, NF1, NF2, PALB2, PDGFRA, PHOX2B*, PMS2, POLD1, POLE, POT1, PRKAR1A, PTCH1, PTEN, RAD50, RAD51C, RAD51D, RB1, RECQL4, RET, RPS20, RUNX1, SDHAF2, SDHB, SDHC, SDHD, SMAD4, SMARCA4, SMARCB1, SMARCE1, STK11, SUFU, TERC, TERT, TMEM127, TP53, TSC1, TSC2, VHL, WRN*, WT1. The following genes were evaluated for sequence changes only: EGFR*, HOXB13*, MITF*, NTHL1*, SDHA  Results: Negative, no pathogenic variants identified.  The date of this test report is 07/02/2017.         FAMILY HISTORY:  We obtained a detailed, 4-generation family history.  Significant diagnoses are listed below: Family History  Problem Relation Age of Onset  . Heart disease Mother   . Heart disease Father          pacemaker  . Skin cancer Father        basal cell  . Colon cancer Paternal Grandmother 48  . Colon cancer Paternal Grandfather 34  . Colon cancer Maternal Grandmother 14  . Cancer Cousin 55       sarcoma of the chest wall  . Prostate cancer Cousin 50       metastatic  . Breast cancer Other   . Stomach cancer Neg Hx   . Colon polyps Neg Hx   . Rectal cancer Neg Hx   . Esophageal cancer Neg Hx     Ms. Reek has a daughter who is 20 and a son who is 62 with no history of cancer. Ms. Taber has 2 sisters ages 40 and 83 with no history of cancer.    Ms. Urias father: died at 59, he had basal cell skin cancer.  Paternal Aunts/Uncles: none Paternal cousins: none Paternal grandfather: died of colon cancer at 69 Paternal grandmother:died of colon cancer at 31  Ms. Colbath's mother: 52, no history of cancer.  She had a hysterectomy.   Maternal Aunts/Uncles: 1 maternal uncle died at 56 with no history of cancer. 1 paternal aunt is also deceased with no history of cancer.  Maternal cousins: 1 maternal cousin had sarcoma of the chest wall in her 12's.  She reports she may have had a thyroid problem as well. Another maternal cousin died of prostate cancer at 65, that was diagnosed at 27 and metastasized.  Maternal grandfather: unk cause of death Maternal grandmother:died at 94 due to colon cancer.   Ms. Chestnutt is unaware of previous family history  of genetic testing for hereditary cancer risks. Patient's maternal ancestors are of Caucasian descent, and paternal ancestors are of  descent. There is no reported Ashkenazi Jewish ancestry. There is no known consanguinity.  GENETIC TEST RESULTS: Genetic testing performed through [facility]'s Multi-Cancer Panel + Colorectal Panel (with preliminary evidence genes reported out on 07/02/2017 showed no pathogenic mutations. The following genes were evaluated for sequence changes and exonic deletions/duplications: ALK, APC, ATM, AXIN2, BAP1, BARD1,  BLM, BMPR1A, BRCA1, BRCA2, BRIP1, CASR, CDC73, CDH1, CDK4, CDKN1B, CDKN1C, CDKN2A (p14ARF), CDKN2A (p16INK4a), CEBPA, CHEK2, CTNNA1, DICER1, DIS3L2, EPCAM*, FH, FLCN, GALNT12, GATA2, GPC3, GREM1*, HRAS, KIT, MAX, MEN1, MET, MLH1, MLH3, MSH2, MSH3, MSH6, MUTYH, NBN, NF1, NF2, PALB2, PDGFRA, PHOX2B*, PMS2, POLD1, POLE, POT1, PRKAR1A, PTCH1, PTEN, RAD50, RAD51C, RAD51D, RB1, RECQL4, RET, RPS20, RUNX1, SDHAF2, SDHB, SDHC, SDHD, SMAD4, SMARCA4, SMARCB1, SMARCE1, STK11, SUFU, TERC, TERT, TMEM127, TP53, TSC1, TSC2, VHL, WRN*, WT1. The following genes were evaluated for sequence changes only:EGFR*, HOXB13*, MITF*, NTHL1*, SDHA.  The test report will be scanned into EPIC and will be located under the Molecular Pathology section of the Results Review tab. A portion of the result report is included below for reference.     We discussed with Ms. Schmuck that because current genetic testing is not perfect, it is possible there may be a gene mutation in one of these genes that current testing cannot detect, but that chance is small.  We also discussed, that there could be another gene that has not yet been discovered, or that we have not yet tested, that is responsible for the cancer diagnoses in the family. It is also possible there is a hereditary cause for the cancer in the family that Ms. Roehm did not inherit and therefore was not identified in her testing.  Therefore, it is important to remain in touch with cancer genetics in the future so that we can continue to offer Ms. Ganim the most up to date genetic testing.   ADDITIONAL GENETIC TESTING: We discussed with Ms. Milberger that her genetic testing was fairly extensive.  If there are are genes identified to increase cancer risk that can be analyzed in the future, we would be happy to discuss and coordinate this testing at that time.    CANCER SCREENING RECOMMENDATIONS: This negative result means that we were unable to identify a hereditary cause for her  personal and family history of cancer at this time.  While reassuring this result does not rule out a hereditary cause for her or her family's cancer.   Given Ms. Nicoson's personal and family historys, we must consider these negative results carefully.  It is still possible that there could be genetic mutations that are undetectable by current technology, or genetic mutations in genes that have not been tested or identified to increase cancer risk.  It is recommended she continue to follow the cancer management and screening guidelines provided by her oncology and primary healthcare provider. An individual's cancer risk is not determined by genetic test results alone.  Overall cancer risk assessment includes additional factors such as personal medical history, family history, etc.  These should be used to make a personalized plan for cancer prevention and surveillance.    RECOMMENDATIONS FOR FAMILY MEMBERS:  Relatives in this family might be at some increased risk of developing cancer, over the general population risk, simply due to the family history of cancer.  We recommended women in this family have a yearly mammogram beginning at age 53, or 74 years younger  than the earliest onset of cancer, an annual clinical breast exam, and perform monthly breast self-exams. Women in this family should also have a gynecological exam as recommended by their primary provider.   We recommend her children have colonoscopies every 5 years starting by age 41 (or as directed by their doctors).  Ms. Hugh siblings should also have colonoscopies every 5 years (or as directed by their physicians).  All family members should inform their physicians about the family history of cancer so their doctors can make the most appropriate screening recommendations for them.   FOLLOW-UP: Lastly, we discussed with Ms. Rydberg that cancer genetics is a rapidly advancing field and it is possible that new genetic tests will be appropriate  for her and/or her family members in the future. We encouraged her to remain in contact with cancer genetics on an annual basis so we can update her personal and family histories and let her know of advances in cancer genetics that may benefit this family.   Our contact number was provided. Ms. Vonbehren questions were answered to her satisfaction, and she knows she is welcome to call us at anytime with additional questions or concerns.   Ferol Luz, MS, [facility] Certified Genetic Counselor [email or web address]

## 2017-07-13 ENCOUNTER — Ambulatory Visit (AMBULATORY_SURGERY_CENTER): Payer: BLUE CROSS/BLUE SHIELD | Admitting: Internal Medicine

## 2017-07-13 ENCOUNTER — Encounter

## 2017-07-13 ENCOUNTER — Other Ambulatory Visit: Payer: Self-pay

## 2017-07-13 ENCOUNTER — Encounter: Payer: Self-pay | Admitting: Internal Medicine

## 2017-07-13 VITALS — BP 119/76 | HR 73 | Temp 98.0°F | Resp 18 | Ht 65.0 in | Wt 148.0 lb

## 2017-07-13 DIAGNOSIS — K6289 Other specified diseases of anus and rectum: Secondary | ICD-10-CM | POA: Diagnosis not present

## 2017-07-13 DIAGNOSIS — Z85048 Personal history of other malignant neoplasm of rectum, rectosigmoid junction, and anus: Secondary | ICD-10-CM

## 2017-07-13 MED ORDER — SODIUM CHLORIDE 0.9 % IV SOLN: 500.0000 mL | Freq: Once | INTRAVENOUS | Status: DC

## 2017-07-13 NOTE — Progress Notes (Signed)
Report given to PACU, vss 

## 2017-07-13 NOTE — Op Note (Signed)
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 The patient's tolerance of                            previous anesthesia was also reviewed. The risks                            and benefits of the procedure and the sedation                            options and risks were discussed with the patient.                            All questions were answered, and informed consent                            was obtained. Prior Anticoagulants: The patient has                            taken no previous anticoagulant or antiplatelet  agents. ASA Grade Assessment: II - A patient with                            mild systemic disease. After reviewing the risks                            and benefits, the patient was deemed in                            satisfactory condition to undergo the procedure.                           After obtaining informed consent, the colonoscope                            was passed under direct  vision. Throughout the                            procedure, the patient's blood pressure, pulse, and                            oxygen saturations were monitored continuously. The                            Colonoscope was introduced through the anus and                            advanced to the cecum, identified by appendiceal                            orifice and ileocecal valve. The colonoscopy was                            performed without difficulty. The patient tolerated                            the procedure well. The quality of the bowel                            preparation was excellent. The ileocecal valve,                            appendiceal orifice, and rectum were photographed. Scope In: 9:20:38 AM Scope Out: 9:32:14 AM Scope Withdrawal Time: 0 hours 9 minutes 59 seconds  Total Procedure Duration: 0 hours 11 minutes 36 seconds  Findings:                 The digital exam findings include mild anal                            stenosis (due to prior surgery). Pertinent                            negatives include no palpable rectal lesions.  There was evidence of a prior surgical intervention                            in the distal rectum.                           A localized area of nodular mucosa was found in the                            distal rectum at area of prior surgical resection                            (likely this is post-surgical). This was biopsied                            with a cold forceps for histology to exclude                            adenoma.                           There was evidence of a prior end-to-end                            colo-colonic anastomosis in the recto-sigmoid                            colon. This was patent and was characterized by                            healthy appearing mucosa.                           The exam was otherwise without abnormality.                            Retroflexion in the rectum was not performed due to                            post-surgical anatomy. Complications:            No immediate complications. Estimated Blood Loss:     Estimated blood loss was minimal. Impression:               - Prior surgical intervention in the distal rectum                            at dentate line.                           - Nodular mucosa in the distal rectum. Biopsied.                           - Patent end-to-end colo-colonic anastomosis,  characterized by healthy appearing mucosa.                           - The examination was otherwise normal. Recommendation:           - Patient has a contact number available for                            emergencies. The signs and symptoms of potential                            delayed complications were discussed with the                            patient. Return to normal activities tomorrow.                            Written discharge instructions were provided to the                            patient.                           - Resume previous diet.                           - Continue present medications.                           - Await pathology results.                           - Repeat colonoscopy is recommended for                            surveillance. The colonoscopy date will be                            determined after pathology results from today's                            exam become available for review. Jerene Bears, MD 07/13/2017 9:44:46 AM This report has been signed electronically.

## 2017-07-13 NOTE — Patient Instructions (Signed)
Biopsies taken of rectal nodule.  YOU HAD AN ENDOSCOPIC PROCEDURE TODAY AT [facility]:   Refer to the procedure report that was given to you for any specific questions about what was found during the examination.  If the procedure report does not answer your questions, please call your gastroenterologist to clarify.  If you requested that your care partner not be given the details of your procedure findings, then the procedure report has been included in a sealed envelope for you to review at your convenience later.  YOU SHOULD EXPECT: Some feelings of bloating in the abdomen. Passage of more gas than usual.  Walking can help get rid of the air that was put into your GI tract during the procedure and reduce the bloating. If you had a lower endoscopy (such as a colonoscopy or flexible sigmoidoscopy) you may notice spotting of blood in your stool or on the toilet paper. If you underwent a bowel prep for your procedure, you may not have a normal bowel movement for a few days.  Please Note:  You might notice some irritation and congestion in your nose or some drainage.  This is from the oxygen used during your procedure.  There is no need for concern and it should clear up in a day or so.  SYMPTOMS TO REPORT IMMEDIATELY:   Following lower endoscopy (colonoscopy or flexible sigmoidoscopy):  Excessive amounts of blood in the stool  Significant tenderness or worsening of abdominal pains  Swelling of the abdomen that is new, acute  Fever of 100F or higher   For urgent or emergent issues, a gastroenterologist can be reached at any hour by calling [phone].   DIET:  We do recommend a small meal at first, but then you may proceed to your regular diet.  Drink plenty of fluids but you should avoid alcoholic beverages for 24 hours.  ACTIVITY:  You should plan to take it easy for the rest of today and you should NOT DRIVE or use heavy machinery until tomorrow (because of the sedation  medicines used during the test).    FOLLOW UP: Our staff will call the number listed on your records the next business day following your procedure to check on you and address any questions or concerns that you may have regarding the information given to you following your procedure. If we do not reach you, we will leave a message.  However, if you are feeling well and you are not experiencing any problems, there is no need to return our call.  We will assume that you have returned to your regular daily activities without incident.  If any biopsies were taken you will be contacted by phone or by letter within the next 1-3 weeks.  Please call us at [phone] if you have not heard about the biopsies in 3 weeks.    SIGNATURES/CONFIDENTIALITY: You and/or your care partner have signed paperwork which will be entered into your electronic medical record.  These signatures attest to the fact that that the information above on your After Visit Summary has been reviewed and is understood.  Full responsibility of the confidentiality of this discharge information lies with you and/or your care-partner.

## 2017-07-13 NOTE — Progress Notes (Signed)
Called to room to assist during endoscopic procedure.  Patient ID and intended procedure confirmed with present staff. Received instructions for my participation in the procedure from the performing physician.  

## 2017-07-16 ENCOUNTER — Telehealth: Payer: Self-pay

## 2017-07-16 NOTE — Telephone Encounter (Signed)
  Follow up Call-  Call back number 07/13/2017 11/22/2016 02/07/2016 01/27/2015  Post procedure Call Back phone  # [phone]  Permission to leave phone message Yes Yes Yes Yes  Some recent data might be hidden     Patient questions:  Do you have a fever, pain , or abdominal swelling? No. Pain Score  0 *  Have you tolerated food without any problems? Yes.    Have you been able to return to your normal activities? Yes.    Do you have any questions about your discharge instructions: Diet   No. Medications  No. Follow up visit  No.  Do you have questions or concerns about your Care? Yes.    Actions: * If pain score is 4 or above: No action needed, pain <4.  Pt reported that the night of her coloscopy she woke up itching in various areas of her body.  I asked if there was a rash.  Pt denied.  Pt said itching was gone by the next am and no further sx.  maw

## 2017-07-19 ENCOUNTER — Ambulatory Visit: Payer: BLUE CROSS/BLUE SHIELD | Admitting: Physical Therapy

## 2017-07-19 ENCOUNTER — Encounter: Payer: Self-pay | Admitting: Physical Therapy

## 2017-07-19 DIAGNOSIS — R279 Unspecified lack of coordination: Secondary | ICD-10-CM

## 2017-07-19 DIAGNOSIS — M6281 Muscle weakness (generalized): Secondary | ICD-10-CM | POA: Diagnosis not present

## 2017-07-19 DIAGNOSIS — R252 Cramp and spasm: Secondary | ICD-10-CM

## 2017-07-19 NOTE — Therapy (Signed)
[facility] [address] [place], [place], [address] Phone: [phone]   Fax:  [phone]  Physical Therapy Treatment  Patient Details  Name: Natasha Campbell MRN: [id number] Date of Birth: Jun 18, 1950 Referring Provider: Dr. Michael Boston   Encounter Date: 07/19/2017  PT End of Session - 07/19/17 0854    Visit Number  6    Date for PT Re-Evaluation  09/05/17    Authorization Type  [employer]    PT Start Time  0845    PT Stop Time  0925    PT Time Calculation (min)  40 min    Activity Tolerance  Patient tolerated treatment well    Behavior During Therapy  [facility] for tasks assessed/performed       Past Medical History:  Diagnosis Date  . Allergy   . Arthritis    hands  . Blood in stool    history  . Blood transfusion without reported diagnosis 2012   with colon surgery  . Chronic headaches   . Chronic kidney disease 2014   20% kidney function failure per patient  acute episode per patient,   . Family history of breast cancer   . Family history of colon cancer   . Family history of colon cancer   . Family history of malignant neoplasm of gastrointestinal tract   . Family history of prostate cancer   . Family history of prostate cancer   . Headache(784.0)    migraines  . Insomnia   . Rectal cancer, T1N0 s/p LAR 12/02/2010 11/01/2010    Past Surgical History:  Procedure Laterality Date  . COLON RESECTION  12/02/2010   Procedure: COLON RESECTION LAPAROSCOPIC;  Surgeon: Adin Hector, MD;  Location: [facility];  Service: General;  Laterality: N/A;  Laparoscopic Low Anterior Resection, Rigid Proctoscopy  . COLON SURGERY  02/2016   rectal cancer  . COLONOSCOPY     multiple d/t rectal cancer dx  . PARTIAL PROCTECTOMY BY TEM N/A 02/29/2016   Procedure: PARTIAL PROCTECTOMY BY TEM OF RECTAL MASS ERAS PATHWAY;  Surgeon: Michael Boston, MD;  Location: [facility];  Service: General;  Laterality: N/A;  . POLYPECTOMY    .  TONSILLECTOMY AND ADENOIDECTOMY     age 67  . WISDOM TOOTH EXTRACTION      There were no vitals filed for this visit.  Subjective Assessment - 07/19/17 0854    Subjective  I have been busy the past week due to visitors.     Patient Stated Goals  regular bowel movements and not to loose stools    Currently in Pain?  No/denies                    Pelvic Floor Special Questions - 07/19/17 0001    Biofeedback  sidely resting tone is 1 uv, quick flicks average 6 uv and able to relax in 2 seconds, contract 5 seconds around 4-5 uv with relax taking 2 sec;     Biofeedback sensor type  Surface rectum                PT Education - 07/19/17 0928    Education Details  Access Code: [email or web address]     Person(s) Educated  Patient    Methods  Explanation;Demonstration;Handout;Verbal cues    Comprehension  Verbalized understanding;Returned demonstration       PT Short Term Goals - 06/27/17 0930      PT SHORT TERM GOAL #1   Title  improved consistency of bowel movements due to regular meals, fiber, and abdominal massage techniques    Time  4    Period  Weeks    Status  Achieved      PT SHORT TERM GOAL #2   Title  able to bulge pelvic floor due to increased muscle length    Baseline  learning with pelvic EMG    Time  4    Period  Weeks    Status  On-going      PT SHORT TERM GOAL #3   Title  able to perform initial HEP correctly for improved bowel control    Time  4    Period  Weeks    Status  Achieved      PT SHORT TERM GOAL #4   Title  ability to isolate pelvic floor contraction without contracting the left gluteal.     Time  4    Period  Weeks    Status  Achieved        PT Long Term Goals - 07/19/17 0929      PT LONG TERM GOAL #1   Title  ability to walk for 30  minutes without bowel leakage for 2 weeks due to increased strength and breathing coordination    Time  12    Period  Weeks    Status  On-going      PT LONG TERM GOAL #2   Title  pt independent  with advanced HEP    Baseline  still learning    Time  12    Period  Weeks    Status  On-going      PT LONG TERM GOAL #3   Title  able to hold pelvic floor contraction of 3/5 for 10 seconds due to improved muscle function and endurance    Baseline  5 sec    Time  12    Period  Months    Status  On-going      PT LONG TERM GOAL #4   Title  Pt demonstrates improved bowel control with no bowel leakage for 2 weeks due to improved strength and tissue mobility of the rectum    Baseline  just had a colonoscopy    Time  12    Period  Weeks    Status  On-going      PT LONG TERM GOAL #5   Title  reports stool is quarter size diameter consistently due to increased abilty for anal sphincter muscles to relax    Time  12    Period  Weeks    Status  On-going            Plan - 07/19/17 3419    Clinical Impression Statement  Patient ia able to relax to 1 uv.  Quick flick average 6 uv with 2 seconds to fully relax. Contract average 4-5 uv for 5 seconds with tactile cues to keep the contraction in sidely. Patient had a colonoscopy last week so her bowels are starting to readjust.  Patient is having some leakage due to the diarrhea from the colonoscopy. Patient is able to relax pelvic floor with facilitation of bowel movement. Patient is able to exercise with pelvic floor contraction. Patient will beneift from skilled therapy to improve core strength with pelvic floor strength to reduce leakage.     Rehab Potential  Excellent    Clinical Impairments Affecting Rehab Potential  Rectal cancer T1N0 s/p TEM 02/29/2016; s/p laparoscopically assisted low anterior resection  12/02/2010    PT Frequency  1x / week    PT Duration  12 weeks    PT Treatment/Interventions  Biofeedback;Therapeutic activities;Therapeutic exercise;Neuromuscular re-education;Patient/family education;Scar mobilization;Manual techniques;Passive range of motion;Dry needling    PT Next Visit Plan  pelvic floor EMG in sidely then sitting;   abdominal massage; core strength    PT Home Exercise Plan  Access Code: [email or web address]     Consulted and Agree with Plan of Care  Patient       Patient will benefit from skilled therapeutic intervention in order to improve the following deficits and impairments:  Increased fascial restricitons, Decreased coordination, Decreased scar mobility, Decreased mobility, Increased muscle spasms, Decreased range of motion, Decreased endurance, Decreased activity tolerance, Decreased strength  Visit Diagnosis: Muscle weakness (generalized)  Unspecified lack of coordination  Cramp and spasm     Problem List Patient Active Problem List   Diagnosis Date Noted  . Genetic testing 07/11/2017  . Unilateral primary osteoarthritis, left knee 03/07/2017  . Chronic pain of left knee 12/20/2016  . Family history of colon cancer   . Family history of breast cancer   . Family history of prostate cancer   . Adenomatous rectal polyp s/p TEM resection 02/29/2016 02/29/2016  . Pelvic pain 05/11/2011  . Polyuria 05/11/2011  . Bowel habit changes 05/11/2011  . Heartburn 02/06/2011  . Postoperative anemia 01/12/2011  . Insomnia 12/15/2010  . Rectal cancer, T1N0 s/p LAR 12/02/2010 11/01/2010  . Family history of malignant neoplasm of gastrointestinal tract 09/02/2010    Earlie Counts, PT 07/19/17 9:31 AM   [facility] [address], [place], [place], [address] Phone: [phone]   Fax:  [phone]  Name: MADISSON KULAGA MRN: [id number] Date of Birth: September 06, 1950

## 2017-07-19 NOTE — Patient Instructions (Signed)
Access Code: [email or web address]  URL: [email or web address]  Date: 07/19/2017  Prepared by: Earlie Counts   Exercises  Cat Cow - 10 reps - 1 sets - 1 hold - 1x daily - 7x weekly  Child's Pose Stretch - 2 reps - 1 sets - 30 hold - 1x daily - 7x weekly  Child's Pose with Sidebending - 2 reps - 1 sets - 30 hold - 1x daily - 7x weekly  Prone Press Up - 5 reps - 1 sets - 3 hold - 1x daily - 7x weekly  Supine Single Knee to Chest - 2 reps - 1 sets - 30 hold - 1x daily - 7x weekly  Supine Double Knee to Chest - 2 reps - 1 sets - 30 hold - 1x daily - 7x weekly  Supine Figure 4 Piriformis Stretch - 2 reps - 1 sets - 30 hold - 1x daily - 7x weekly  Supine Hamstring Stretch - 2 reps - 1 sets - 30 hold - 1x daily - 7x weekly  Beginner Clam - 10 reps - 3 sets - 1x daily - 7x weekly  Beginner Bridge - 10 reps - 2 sets - 1x daily - 7x weekly  Hooklying Isometric Hip Flexion - 10 reps - 1 sets - 5 sec hold - 1x daily - 7x weekly  Quadruped Transversus Abdominis Bracing - 10 reps - 1 sets - sec hold - 1x daily - 7x weekly  [facility] [address], [place],  [address] Phone # [phone] Fax [phone]

## 2017-07-20 ENCOUNTER — Encounter: Payer: Self-pay | Admitting: Internal Medicine

## 2017-08-02 ENCOUNTER — Encounter: Payer: BLUE CROSS/BLUE SHIELD | Admitting: Physical Therapy

## 2017-08-14 ENCOUNTER — Encounter: Payer: BLUE CROSS/BLUE SHIELD | Admitting: Physical Therapy

## 2017-08-27 ENCOUNTER — Ambulatory Visit: Payer: BLUE CROSS/BLUE SHIELD | Attending: Surgery | Admitting: Physical Therapy

## 2017-08-27 ENCOUNTER — Encounter: Payer: Self-pay | Admitting: Physical Therapy

## 2017-08-27 DIAGNOSIS — R252 Cramp and spasm: Secondary | ICD-10-CM

## 2017-08-27 DIAGNOSIS — M6281 Muscle weakness (generalized): Secondary | ICD-10-CM | POA: Diagnosis present

## 2017-08-27 DIAGNOSIS — R279 Unspecified lack of coordination: Secondary | ICD-10-CM
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
## 2017-08-27 NOTE — Patient Instructions (Signed)
Access Code: [email or web address]  URL: [email or web address]  Date: 08/27/2017  Prepared by: Earlie Counts   Exercises  Cat Cow - 10 reps - 1 sets - 1 hold - 1x daily - 7x weekly  Child's Pose Stretch - 2 reps - 1 sets - 30 hold - 1x daily - 7x weekly  Child's Pose with Sidebending - 2 reps - 1 sets - 30 hold - 1x daily - 7x weekly  Prone Press Up - 5 reps - 1 sets - 3 hold - 1x daily - 7x weekly  Supine Single Knee to Chest - 2 reps - 1 sets - 30 hold - 1x daily - 7x weekly  Supine Double Knee to Chest - 2 reps - 1 sets - 30 hold - 1x daily - 7x weekly  Supine Figure 4 Piriformis Stretch - 2 reps - 1 sets - 30 hold - 1x daily - 7x weekly  Supine Hamstring Stretch - 2 reps - 1 sets - 30 hold - 1x daily - 7x weekly  Beginner Clam - 10 reps - 3 sets - 1x daily - 7x weekly  Beginner Bridge - 10 reps - 2 sets - 1x daily - 7x weekly  Hooklying Isometric Hip Flexion - 10 reps - 1 sets - 5 sec hold - 1x daily - 7x weekly  Quadruped Transversus Abdominis Bracing - 10 reps - 1 sets - sec hold - 1x daily - 7x weekly  Sidelying Hip Extension in Abduction - 10 reps - 2 sets - 1x daily - 7x weekly  Supine March - 10 reps - 1 sets - 1x daily - 7x weekly  Bent Knee Fallouts with Alternating Legs - 10 reps - 1 sets - 1x daily - 7x weekly  Quadruped Exhale with Pelvic Floor Contraction and Arm Raise - 10 reps - 1 sets - 1x daily - 7x weekly  Quadruped Leg Lifts - 10 reps - 1 sets - 1x daily - 7x weekly  Seated Pelvic Floor Contraction - 5 reps - 1 sets - 10 sec hold - 3x daily - 7x weekly  [facility] [address], [place], [place] [address] Phone # [phone] Fax [phone]

## 2017-09-06 DIAGNOSIS — E7849 Other hyperlipidemia: Secondary | ICD-10-CM | POA: Diagnosis not present

## 2017-09-06 DIAGNOSIS — N390 Urinary tract infection, site not specified: Secondary | ICD-10-CM | POA: Diagnosis not present

## 2017-09-06 DIAGNOSIS — M859 Disorder of bone density and structure, unspecified: Secondary | ICD-10-CM | POA: Diagnosis not present

## 2017-09-06 DIAGNOSIS — N183 Chronic kidney disease, stage 3 (moderate): Secondary | ICD-10-CM | POA: Diagnosis not present

## 2017-09-12 DIAGNOSIS — F418 Other specified anxiety disorders: Secondary | ICD-10-CM | POA: Diagnosis not present

## 2017-09-12 DIAGNOSIS — D638 Anemia in other chronic diseases classified elsewhere: Secondary | ICD-10-CM | POA: Diagnosis not present

## 2017-09-12 DIAGNOSIS — G43909 Migraine, unspecified, not intractable, without status migrainosus: Secondary | ICD-10-CM | POA: Diagnosis not present

## 2017-09-12 DIAGNOSIS — E7849 Other hyperlipidemia: Secondary | ICD-10-CM | POA: Diagnosis not present

## 2017-09-12 DIAGNOSIS — C179 Malignant neoplasm of small intestine, unspecified: Secondary | ICD-10-CM | POA: Diagnosis not present

## 2017-09-12 DIAGNOSIS — N2581 Secondary hyperparathyroidism of renal origin: Secondary | ICD-10-CM | POA: Diagnosis not present

## 2017-09-12 DIAGNOSIS — Z1389 Encounter for screening for other disorder: Secondary | ICD-10-CM | POA: Diagnosis not present

## 2017-09-12 DIAGNOSIS — Z6824 Body mass index (BMI) 24.0-24.9, adult: Secondary | ICD-10-CM | POA: Diagnosis not present

## 2017-09-12 DIAGNOSIS — Z23 Encounter for immunization: Secondary | ICD-10-CM | POA: Diagnosis not present

## 2017-09-12 DIAGNOSIS — N183 Chronic kidney disease, stage 3 (moderate): Secondary | ICD-10-CM | POA: Diagnosis not present

## 2017-09-12 DIAGNOSIS — M25562 Pain in left knee: Secondary | ICD-10-CM | POA: Diagnosis not present

## 2017-09-12 DIAGNOSIS — M859 Disorder of bone density and structure, unspecified: Secondary | ICD-10-CM | POA: Diagnosis not present

## 2017-09-12 DIAGNOSIS — Z0000 Encounter for general adult medical examination without abnormal findings: Secondary | ICD-10-CM | POA: Diagnosis not present

## 2017-09-17 ENCOUNTER — Encounter: Payer: BLUE CROSS/BLUE SHIELD | Admitting: Internal Medicine

## 2017-10-18 ENCOUNTER — Encounter: Payer: Self-pay | Admitting: *Deleted

## 2017-11-06 DIAGNOSIS — Z01419 Encounter for gynecological examination (general) (routine) without abnormal findings: Secondary | ICD-10-CM | POA: Diagnosis not present

## 2017-11-06 DIAGNOSIS — Z6824 Body mass index (BMI) 24.0-24.9, adult: Secondary | ICD-10-CM | POA: Diagnosis not present

## 2017-11-14 DIAGNOSIS — Z23 Encounter for immunization: Secondary | ICD-10-CM | POA: Diagnosis not present

## 2017-11-23 DIAGNOSIS — Z83511 Family history of glaucoma: Secondary | ICD-10-CM | POA: Diagnosis not present

## 2017-11-23 DIAGNOSIS — H2513 Age-related nuclear cataract, bilateral: Secondary | ICD-10-CM | POA: Diagnosis not present

## 2017-11-23 DIAGNOSIS — H40013 Open angle with borderline findings, low risk, bilateral: Secondary | ICD-10-CM | POA: Diagnosis not present

## 2018-06-20 ENCOUNTER — Encounter: Payer: Self-pay | Admitting: Internal Medicine

## 2018-06-25 DIAGNOSIS — Z1231 Encounter for screening mammogram for malignant neoplasm of breast: Secondary | ICD-10-CM | POA: Diagnosis not present

## 2018-06-25 DIAGNOSIS — Z803 Family history of malignant neoplasm of breast: Secondary | ICD-10-CM | POA: Diagnosis not present

## 2018-06-28 ENCOUNTER — Encounter: Payer: Self-pay | Admitting: Internal Medicine

## 2018-07-19 ENCOUNTER — Ambulatory Visit: Payer: Self-pay | Admitting: *Deleted

## 2018-07-19 ENCOUNTER — Other Ambulatory Visit: Payer: Self-pay

## 2018-07-19 VITALS — Ht 66.5 in | Wt 140.0 lb

## 2018-07-19 DIAGNOSIS — Z85048 Personal history of other malignant neoplasm of rectum, rectosigmoid junction, and anus: Secondary | ICD-10-CM

## 2018-07-19 DIAGNOSIS — Z86010 Personal history of colonic polyps: Secondary | ICD-10-CM

## 2018-07-19 MED ORDER — SUPREP BOWEL PREP KIT 17.5-3.13-1.6 GM/177ML PO SOLN: ORAL | 0 refills | Status: DC

## 2018-07-19 NOTE — Progress Notes (Signed)
Pt's previsit is done over the phone and all paperwork (prep instructions, blank consent form to just read over, pre-procedure acknowledgement form and stamped envelope) sent to patient  Pt is aware that care partner will wait in the car during procedure; if they feel like they will be too hot to wait in the car; they may wait in the lobby.  We want them to wear a mask (we do not have any that we can provide them), practice social distancing, and we will check their temperatures when they get here.  I did remind patient that their care partner needs to stay in the parking lot the entire time. Pt will wear mask into building.  No egg or soy allergy  No home oxygen used or hx of sleep apnea  No anesthesia or intubation problems  No diet medications taken   Given her hx, she requests that Dr. Hilarie Fredrickson call her husband with the results the day of her procedure- I sent him staff message to let him know

## 2018-08-01 ENCOUNTER — Telehealth: Payer: Self-pay | Admitting: Internal Medicine

## 2018-08-01 NOTE — Telephone Encounter (Signed)
Left message on vmail for patient regarding Covid-19 screening questions. °Covid-19 Screening Questions: °  °Do you now or have you had a fever in the last 14 days?  °  °Do you have any respiratory symptoms of shortness of breath or cough now or in the last 14 days?  °  °Do you have any family members or close contacts with diagnosed or suspected Covid-19 in the past 14 days?  °  °Have you been tested for Covid-19 and found to be positive?  °  ° °

## 2018-08-01 NOTE — Telephone Encounter (Signed)
Pt responded "no" to all screening questions °

## 2018-08-02 ENCOUNTER — Other Ambulatory Visit: Payer: Self-pay

## 2018-08-02 ENCOUNTER — Ambulatory Visit (AMBULATORY_SURGERY_CENTER): Payer: Medicare Other | Admitting: Internal Medicine

## 2018-08-02 ENCOUNTER — Encounter: Payer: Self-pay | Admitting: Internal Medicine

## 2018-08-02 VITALS — BP 136/76 | HR 67 | Temp 97.8°F | Resp 12 | Ht 66.0 in | Wt 140.0 lb

## 2018-08-02 DIAGNOSIS — D123 Benign neoplasm of transverse colon: Secondary | ICD-10-CM

## 2018-08-02 DIAGNOSIS — D122 Benign neoplasm of ascending colon: Secondary | ICD-10-CM | POA: Diagnosis not present

## 2018-08-02 DIAGNOSIS — Z86010 Personal history of colonic polyps: Secondary | ICD-10-CM

## 2018-08-02 DIAGNOSIS — Z85038 Personal history of other malignant neoplasm of large intestine: Secondary | ICD-10-CM

## 2018-08-02 MED ORDER — SODIUM CHLORIDE 0.9 % IV SOLN: 500.0000 mL | Freq: Once | INTRAVENOUS | Status: DC

## 2018-08-02 NOTE — Progress Notes (Signed)
Pt's states no medical or surgical changes since previsit or office visit.  Temp taken by DS VS taken by CW

## 2018-08-02 NOTE — Progress Notes (Signed)
To PACU, VSS. Report to [email or web address] 

## 2018-08-02 NOTE — Op Note (Signed)
[place] Patient Name: Natasha Campbell Procedure Date: 08/02/2018 10:03 AM MRN: [id number] Endoscopist: Jerene Bears , MD Age: 68 Referring MD:  Date of Birth: [birth year] Gender: Female Account #: [id number] Procedure:                Colonoscopy Indications:              High risk colon cancer surveillance: Personal                            history of colon cancer; history of rectosigmoid                            cancer status post resection, subsequent                            tubulovillous adenoma with foci of adenocarcinoma                            removed by transanal excision in March 2018; last                            colonoscopy July 2019 Medicines:                Monitored Anesthesia Care Procedure:                Pre-Anesthesia Assessment:                           - Prior to the procedure, a History and Physical                            was performed, and patient medications and                            allergies were reviewed. The patient's tolerance of                            previous anesthesia was also reviewed. The risks                            and benefits of the procedure and the sedation                            options and risks were discussed with the patient.                            All questions were answered, and informed consent                            was obtained. Prior Anticoagulants: The patient has                            taken no previous anticoagulant or antiplatelet  agents. ASA Grade Assessment: II - A patient with                            mild systemic disease. After reviewing the risks                            and benefits, the patient was deemed in                            satisfactory condition to undergo the procedure.                           After obtaining informed consent, the colonoscope                            was passed under direct vision. Throughout the                      procedure, the patient's blood pressure, pulse, and                            oxygen saturations were monitored continuously. The                            Colonoscope was introduced through the anus and                            advanced to the cecum, identified by appendiceal                            orifice and ileocecal valve. The colonoscopy was                            performed without difficulty. The patient tolerated                            the procedure well. The quality of the bowel                            preparation was excellent. The ileocecal valve,                            appendiceal orifice, and rectum were photographed. Scope In: 10:15:36 AM Scope Out: 10:32:10 AM Scope Withdrawal Time: 0 hours 14 minutes 40 seconds  Total Procedure Duration: 0 hours 16 minutes 34 seconds  Findings:                 The digital exam reveals scar tissue posteriorly                            without stricture. The anal sphincter is very                            short. Pertinent negatives include no palpable  rectal lesions.                           A 3 mm polyp was found in the ascending colon. The                            polyp was sessile. The polyp was removed with a                            cold snare. Resection and retrieval were complete.                           Three sessile polyps were found in the transverse                            colon. The polyps were 3 to 4 mm in size. These                            polyps were removed with a cold snare. Resection                            and retrieval were complete.                           There was evidence of a prior surgical anastomosis                            in the rectum. This was patent and was                            characterized by healthy appearing mucosa.                           Retroflexion in the rectum was not performed due to                             post-surgical anatomy. Forward views obtained in                            multiple orientations to fully examine the rectum                            and anorectum. Complications:            No immediate complications. Estimated Blood Loss:     Estimated blood loss was minimal. Impression:               - Posterior scarring at external sphincter due to                            prior surgery. No palpable lesions.                           - One 3 mm polyp in the ascending colon, removed  with a cold snare. Resected and retrieved.                           - Three 3 to 4 mm polyps in the transverse colon,                            removed with a cold snare. Resected and retrieved.                           - Patent surgical anastomosis, characterized by                            healthy appearing mucosa. Recommendation:           - Patient has a contact number available for                            emergencies. The signs and symptoms of potential                            delayed complications were discussed with the                            patient. Return to normal activities tomorrow.                            Written discharge instructions were provided to the                            patient.                           - Resume previous diet.                           - Continue present medications.                           - Await pathology results.                           - Repeat colonoscopy is recommended for                            surveillance. The colonoscopy date will be                            determined after pathology results from today's                            exam become available for review. Jerene Bears, MD 08/02/2018 10:43:11 AM This report has been signed electronically.

## 2018-08-02 NOTE — Patient Instructions (Signed)
Impression/Recommendations:  Polyp handout given to patient.  Resume previous diet. Continue present medications. Await pathology results.  Repeat colonoscopy recommended for surveillance.  Date to be determined after pathology results reviewed.  YOU HAD AN ENDOSCOPIC PROCEDURE TODAY AT [facility]:   Refer to the procedure report that was given to you for any specific questions about what was found during the examination.  If the procedure report does not answer your questions, please call your gastroenterologist to clarify.  If you requested that your care partner not be given the details of your procedure findings, then the procedure report has been included in a sealed envelope for you to review at your convenience later.  YOU SHOULD EXPECT: Some feelings of bloating in the abdomen. Passage of more gas than usual.  Walking can help get rid of the air that was put into your GI tract during the procedure and reduce the bloating. If you had a lower endoscopy (such as a colonoscopy or flexible sigmoidoscopy) you may notice spotting of blood in your stool or on the toilet paper. If you underwent a bowel prep for your procedure, you may not have a normal bowel movement for a few days.  Please Note:  You might notice some irritation and congestion in your nose or some drainage.  This is from the oxygen used during your procedure.  There is no need for concern and it should clear up in a day or so.  SYMPTOMS TO REPORT IMMEDIATELY:   Following lower endoscopy (colonoscopy or flexible sigmoidoscopy):  Excessive amounts of blood in the stool  Significant tenderness or worsening of abdominal pains  Swelling of the abdomen that is new, acute  Fever of 100F or higher For urgent or emergent issues, a gastroenterologist can be reached at any hour by calling [phone].   DIET:  We do recommend a small meal at first, but then you may proceed to your regular diet.  Drink plenty of  fluids but you should avoid alcoholic beverages for 24 hours.  ACTIVITY:  You should plan to take it easy for the rest of today and you should NOT DRIVE or use heavy machinery until tomorrow (because of the sedation medicines used during the test).    FOLLOW UP: Our staff will call the number listed on your records 48-72 hours following your procedure to check on you and address any questions or concerns that you may have regarding the information given to you following your procedure. If we do not reach you, we will leave a message.  We will attempt to reach you two times.  During this call, we will ask if you have developed any symptoms of COVID 19. If you develop any symptoms (ie: fever, flu-like symptoms, shortness of breath, cough etc.) before then, please call [phone].  If you test positive for Covid 19 in the 2 weeks post procedure, please call and report this information to [place].    If any biopsies were taken you will be contacted by phone or by letter within the next 1-3 weeks.  Please call us at [phone] if you have not heard about the biopsies in 3 weeks.    SIGNATURES/CONFIDENTIALITY: You and/or your care partner have signed paperwork which will be entered into your electronic medical record.  These signatures attest to the fact that that the information above on your After Visit Summary has been reviewed and is understood.  Full responsibility of the confidentiality of this discharge information lies with  you and/or your care-partner.

## 2018-08-06 ENCOUNTER — Telehealth: Payer: Self-pay

## 2018-08-06 NOTE — Telephone Encounter (Signed)
  Follow up Call-  Call back number 08/02/2018 07/13/2017 11/22/2016 02/07/2016  Post procedure Call Back phone  # [phone]  Permission to leave phone message Yes Yes Yes Yes  Some recent data might be hidden     Patient questions:  Do you have a fever, pain , or abdominal swelling? No. Pain Score  0 *  Have you tolerated food without any problems? Yes.    Have you been able to return to your normal activities? Yes.    Do you have any questions about your discharge instructions: Diet   No. Medications  No. Follow up visit  No.  Do you have questions or concerns about your Care? No.  Actions: * If pain score is 4 or above: 1. No action needed, pain <4.Have you developed a fever since your procedure? no  2.   Have you had an respiratory symptoms (SOB or cough) since your procedure? no  3.   Have you tested positive for COVID 19 since your procedure no  4.   Have you had any family members/close contacts diagnosed with the COVID 19 since your procedure?  no   If yes to any of these questions please route to Joylene John, RN and Alphonsa Gin, Therapist, sports.

## 2018-08-08 ENCOUNTER — Encounter: Payer: Self-pay | Admitting: Internal Medicine

## 2018-09-10 ENCOUNTER — Telehealth: Payer: Self-pay | Admitting: Internal Medicine

## 2018-09-10 NOTE — Telephone Encounter (Signed)
Ok to change recall to 12 months for next surveillance colonoscopy

## 2018-09-10 NOTE — Telephone Encounter (Signed)
This patient does not want to wait 18 months for her recall colon and is requesting a 12 month recall. Please advise.

## 2018-09-10 NOTE — Telephone Encounter (Signed)
Recall changed in Epic to a 12 month recall instead of an 18 month recall. Left message on patient's VM letting her know about the change.

## 2018-09-13 DIAGNOSIS — M859 Disorder of bone density and structure, unspecified: Secondary | ICD-10-CM | POA: Diagnosis not present

## 2018-09-13 DIAGNOSIS — E7849 Other hyperlipidemia: Secondary | ICD-10-CM | POA: Diagnosis not present

## 2018-09-18 DIAGNOSIS — Z0000 Encounter for general adult medical examination without abnormal findings: Secondary | ICD-10-CM | POA: Diagnosis not present

## 2018-09-18 DIAGNOSIS — Z1331 Encounter for screening for depression: Secondary | ICD-10-CM | POA: Diagnosis not present

## 2018-09-18 DIAGNOSIS — N183 Chronic kidney disease, stage 3 (moderate): Secondary | ICD-10-CM | POA: Diagnosis not present

## 2018-09-18 DIAGNOSIS — G43909 Migraine, unspecified, not intractable, without status migrainosus: Secondary | ICD-10-CM | POA: Diagnosis not present

## 2018-09-18 DIAGNOSIS — E7849 Other hyperlipidemia: Secondary | ICD-10-CM | POA: Diagnosis not present

## 2018-09-18 DIAGNOSIS — D638 Anemia in other chronic diseases classified elsewhere: Secondary | ICD-10-CM | POA: Diagnosis not present

## 2018-09-18 DIAGNOSIS — Z23 Encounter for immunization: Secondary | ICD-10-CM | POA: Diagnosis not present

## 2018-09-18 DIAGNOSIS — M8580 Other specified disorders of bone density and structure, unspecified site: Secondary | ICD-10-CM | POA: Diagnosis not present

## 2018-09-18 DIAGNOSIS — C179 Malignant neoplasm of small intestine, unspecified: Secondary | ICD-10-CM | POA: Diagnosis not present

## 2018-09-18 DIAGNOSIS — R82998 Other abnormal findings in urine: Secondary | ICD-10-CM | POA: Diagnosis not present

## 2018-09-18 DIAGNOSIS — E785 Hyperlipidemia, unspecified: Secondary | ICD-10-CM | POA: Diagnosis not present

## 2018-09-18 DIAGNOSIS — F418 Other specified anxiety disorders: Secondary | ICD-10-CM | POA: Diagnosis not present

## 2018-09-18 DIAGNOSIS — N2581 Secondary hyperparathyroidism of renal origin: Secondary | ICD-10-CM | POA: Diagnosis not present

## 2018-10-28 DIAGNOSIS — Z03818 Encounter for observation for suspected exposure to other biological agents ruled out: Secondary | ICD-10-CM | POA: Diagnosis not present

## 2018-11-11 DIAGNOSIS — Z124 Encounter for screening for malignant neoplasm of cervix: Secondary | ICD-10-CM | POA: Diagnosis not present

## 2018-11-11 DIAGNOSIS — Z01419 Encounter for gynecological examination (general) (routine) without abnormal findings: Secondary | ICD-10-CM | POA: Diagnosis not present

## 2018-11-11 DIAGNOSIS — Z6823 Body mass index (BMI) 23.0-23.9, adult: Secondary | ICD-10-CM | POA: Diagnosis not present

## 2018-11-18 DIAGNOSIS — Z03818 Encounter for observation for suspected exposure to other biological agents ruled out: Secondary | ICD-10-CM | POA: Diagnosis not present

## 2018-11-19 IMAGING — CT CT CHEST W/O CM
1 of 2 series · 14 of 32 positions shown, 18 images · non-contrast
Comparison: CT abdomen dated 10/24/2010

CLINICAL DATA: Rectal cancer.  Chronic kidney disease.

EXAM:
CT CHEST, ABDOMEN AND PELVIS WITHOUT CONTRAST
TECHNIQUE: Multidetector CT imaging of the chest, abdomen and pelvis was
performed following the standard protocol without IV contrast.

[Series 2: chest/abd/pelvis w/(date) · axial · 0.76mm/px · z∈[+612,+1173]mm · 14 of 207 slices shown, 18 images]
[im 10/207  soft-tissue]
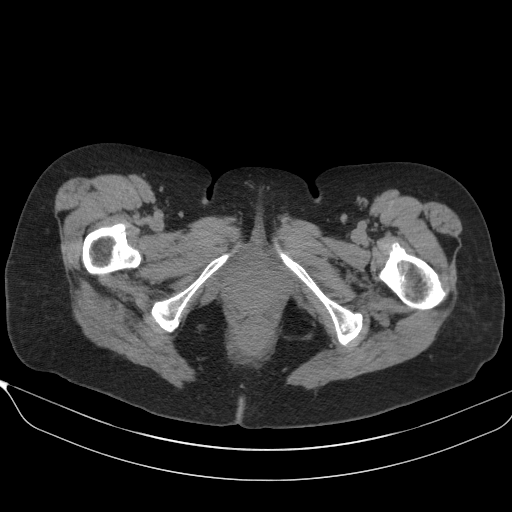
[im 10/207  bone]
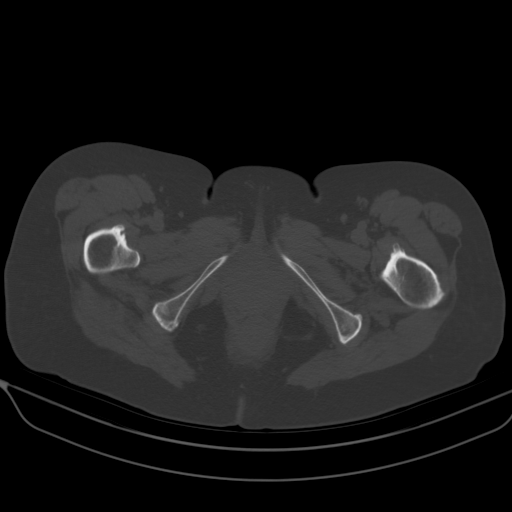
[im 30/207  soft-tissue]
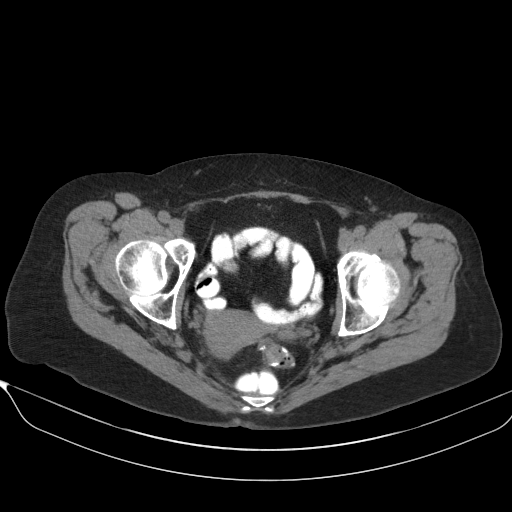
[im 50/207  soft-tissue]
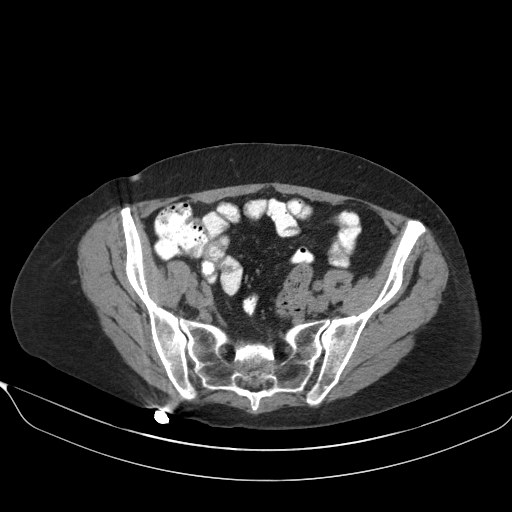
[im 59/207  soft-tissue]
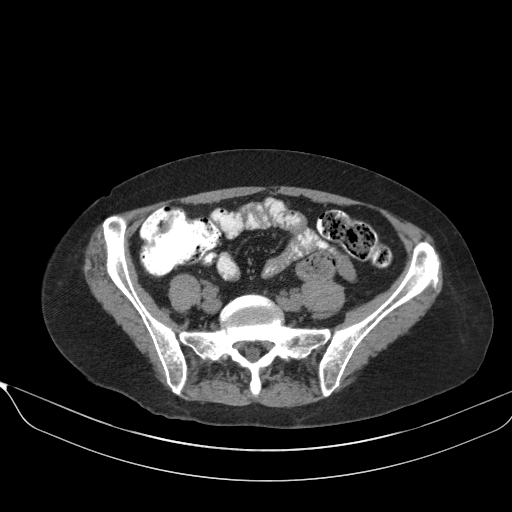
[im 79/207  soft-tissue]
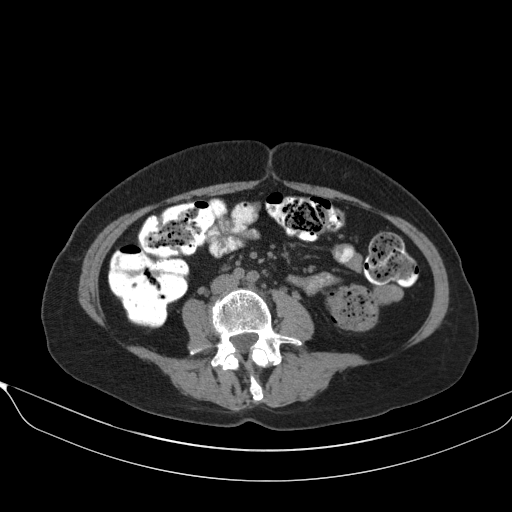
[im 99/207  soft-tissue]
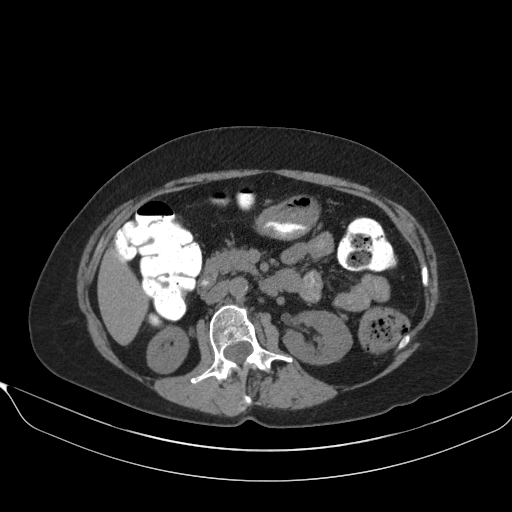
[im 108/207  soft-tissue]
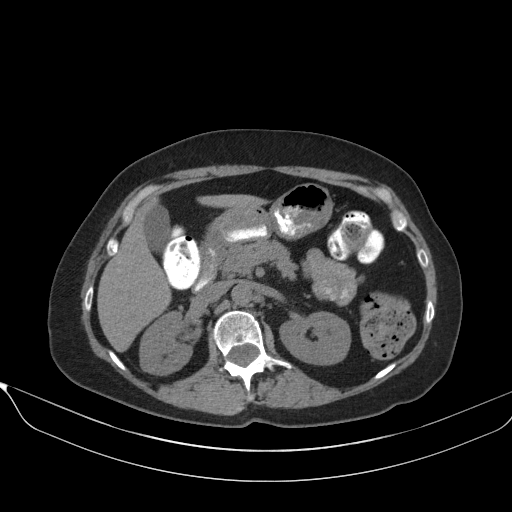
[im 128/207  soft-tissue]
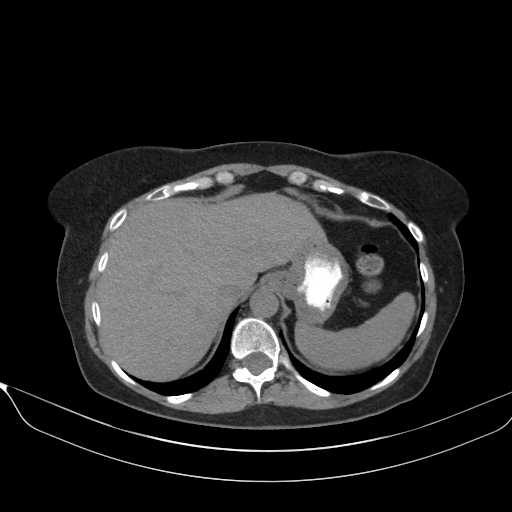
[im 148/207  soft-tissue]
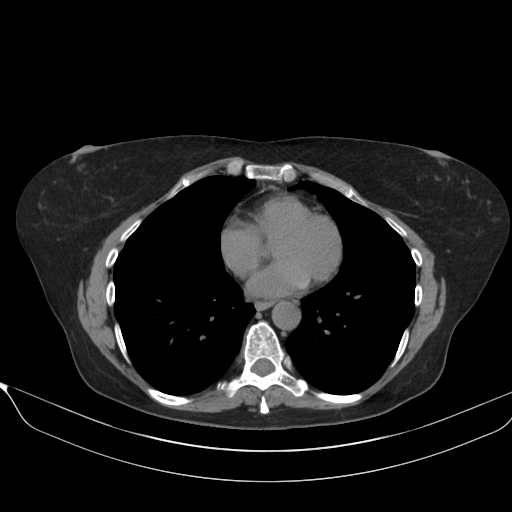
[im 148/207  bone]
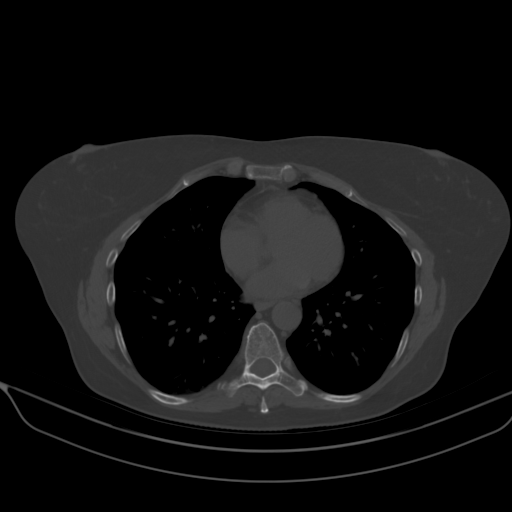
[im 157/207  soft-tissue]
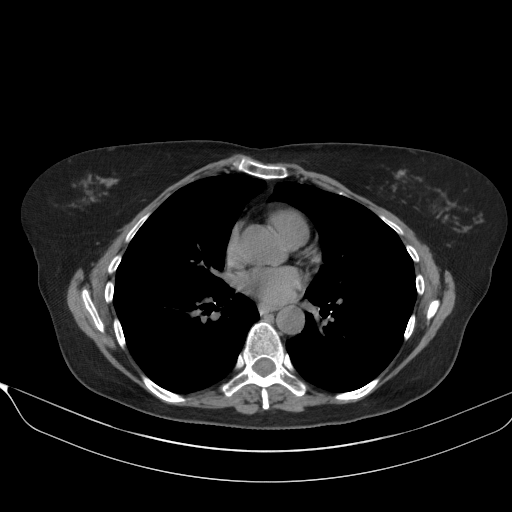
[im 167/207  lung]
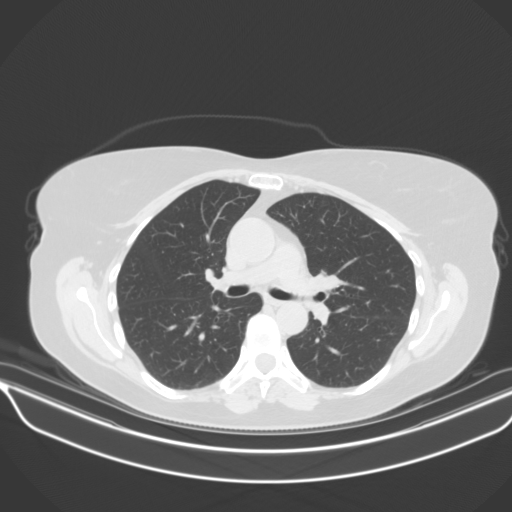
[im 177/207  soft-tissue]
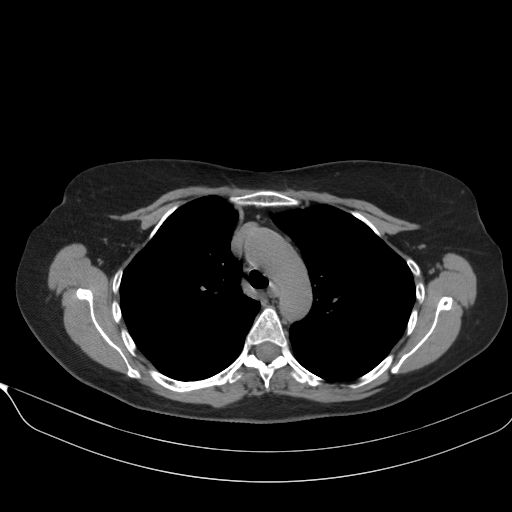
[im 177/207  lung]
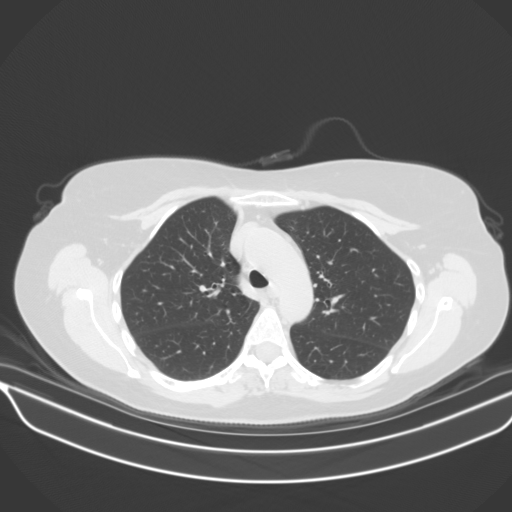
[im 187/207  lung]
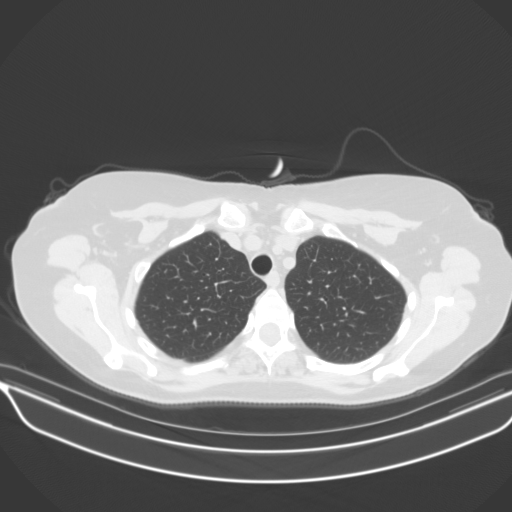
[im 197/207  soft-tissue]
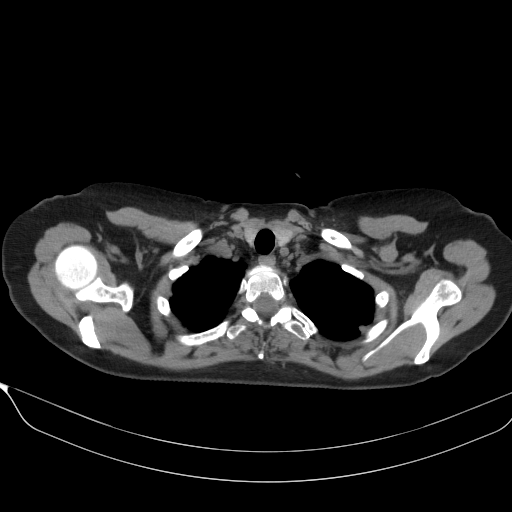
[im 197/207  lung]
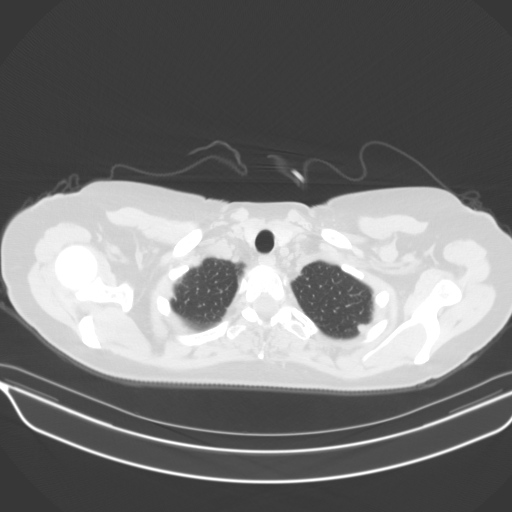

[14 of 32 positions shown; findings below may reference images not displayed]

FINDINGS: CT CHEST FINDINGS

Cardiovascular: Unremarkable

Mediastinum/Nodes: Unremarkable

Lungs/Pleura: Biapical pleuroparenchymal scarring. 3 mm wreck right
middle lobe nodule on image 61/6, no change from 6216.

3 by 4 mm right lower lobe nodule on image 61/6, no change from
6216.

Triangular pleural-based nodular density along the left posterior
lung apex, 1.4 by 0.8 cm on image [DATE], probably from scarring but
slightly focally prominent. There is some calcification along this
bandlike process more distally which is somewhat reassuring for
scarring.

5 mm left lower lobe nodule, image 72/6, no change from 6216.
Adjacent 2 mm nodule on image 71, likewise stable.

Musculoskeletal: Unremarkable

CT ABDOMEN PELVIS FINDINGS

Hepatobiliary: Unremarkable

Pancreas: Unremarkable

Spleen: Unremarkable

Adrenals/Urinary Tract: 2 by 3 mildly air left kidney lower pole
nonobstructive renal calculus. 1 mm left kidney upper pole
nonobstructive renal calculus.

Adrenal glands unremarkable.

Stomach/Bowel: Anastomotic staple line noted in the right rectum.
Otherwise unremarkable.

Vascular/Lymphatic: Mild abdominal aortic atherosclerotic
calcification.

Reproductive: Retroflexed uterus.

Other: No supplemental non-categorized findings.

Musculoskeletal: Levoconvex lumbar scoliosis. Mild pelvic floor
laxity.
IMPRESSION: 1. No findings of active malignancy. Clearly the lack of IV contrast
can reduce sensitivity. If there is high clinical suspicion of
occult recurrence, nuclear medicine PET-CT may be helpful.
2. Small nodules at both lung bases are unchanged from 6216 and
considered benign. There is biapical pleuroparenchymal scarring
slightly focal on the left, probably incidental but may warrant
followup chest CT in 6 months given the slight focal asymmetry and
lack of priors for comparison.
3. Nonobstructive left nephrolithiasis.
4. Other imaging findings of potential clinical significance:
Minimal abdominal aortic atherosclerosis. Levoconvex lumbar
scoliosis. Mild pelvic floor laxity.

## 2019-01-13 DIAGNOSIS — Z03818 Encounter for observation for suspected exposure to other biological agents ruled out: Secondary | ICD-10-CM | POA: Diagnosis not present

## 2019-01-27 DIAGNOSIS — Z03818 Encounter for observation for suspected exposure to other biological agents ruled out: Secondary | ICD-10-CM | POA: Diagnosis not present

## 2019-02-03 ENCOUNTER — Ambulatory Visit (INDEPENDENT_AMBULATORY_CARE_PROVIDER_SITE_OTHER): Payer: Medicare Other | Admitting: Orthopaedic Surgery

## 2019-02-03 ENCOUNTER — Encounter: Payer: Self-pay | Admitting: Orthopaedic Surgery

## 2019-02-03 ENCOUNTER — Ambulatory Visit (INDEPENDENT_AMBULATORY_CARE_PROVIDER_SITE_OTHER): Payer: Medicare Other

## 2019-02-03 ENCOUNTER — Other Ambulatory Visit: Payer: Self-pay

## 2019-02-03 DIAGNOSIS — M25562 Pain in left knee: Secondary | ICD-10-CM

## 2019-02-03 DIAGNOSIS — G8929 Other chronic pain: Secondary | ICD-10-CM

## 2019-02-03 NOTE — Progress Notes (Signed)
Office Visit Note   Patient: Natasha Campbell           Date of Birth: 1950-02-24           MRN: [id number] Visit Date: 02/03/2019              Requested by: Velna Hatchet, MD [address] [place],  [place] [address] PCP: Velna Hatchet, MD   Assessment & Plan: Visit Diagnoses:  1. Chronic pain of left knee     Plan: Given the failure conservative treatment for many years now with her left knee and the fact that her mechanical symptoms are worsening, a MRI is warranted to the left knee at this point to rule out a meniscal tear.  Her clinical exam findings also correlate with this.  Having had multiple injections, I would like to defer any other injections until we have the MRI and she agrees with this as well.  We will see her back after the MRI of her left knee.  All question concerns were answered and addressed.  Follow-Up Instructions: Return in about 2 weeks (around 02/17/2019).   Orders:  Orders Placed This Encounter  Procedures  . XR Knee 1-2 Views Left   No orders of the defined types were placed in this encounter.     Procedures: No procedures performed   Clinical Data: No additional findings.   Subjective: Chief Complaint  Patient presents with  . Left Knee - Pain  The patient is very well-known to me.  She is a very active 69 year old female that have seen for several years now due to issues with her left knee.  She has had steroid injections multiple times.  At this point her left knee pain is waking her up and it is detrimentally affecting her mobility, her quality of life and her activities daily living.  She points the medial joint line.  She is a thin individual.  She is not a diabetic.  At this point she is starting to develop some mechanical symptoms of locking and catching.  She and her husband have been recently renovating a house and she did have a hard mechanical fall about 9 weeks ago injuring her left hip.  HPI  Review of Systems She currently  denies any headache, chest pain, shortness of breath, fever, chills, nausea, vomiting  Objective: Vital Signs: There were no vitals taken for this visit.  Physical Exam She is alert and orient x3 and in no acute distress Ortho Exam Examination of her left knee shows medial joint line tenderness and a positive McMurray's sign to the medial joint line.  The range of motion is full.  Her knees Lachman's exam is negative.  Examination of her left hip shows a move smoothly and fluidly.  She does have a palpable hematoma posterior to the trochanteric area. Specialty Comments:  No specialty comments available.  Imaging: XR Knee 1-2 Views Left  Result Date: 02/03/2019 2 views of her left knee show no acute findings.  The joint space is still well-maintained and the alignment is neutral.    PMFS History: Patient Active Problem List   Diagnosis Date Noted  . Genetic testing 07/11/2017  . Unilateral primary osteoarthritis, left knee 03/07/2017  . Chronic pain of left knee 12/20/2016  . Family history of colon cancer   . Family history of breast cancer   . Family history of prostate cancer   . Adenomatous rectal polyp s/p TEM resection 02/29/2016 02/29/2016  . Pelvic pain 05/11/2011  .  Polyuria 05/11/2011  . Bowel habit changes 05/11/2011  . Heartburn 02/06/2011  . Postoperative anemia 01/12/2011  . Insomnia 12/15/2010  . Rectal cancer, T1N0 s/p LAR 12/02/2010 11/01/2010  . Family history of malignant neoplasm of gastrointestinal tract 09/02/2010   Past Medical History:  Diagnosis Date  . Allergy   . Arthritis    hands  . Blood in stool    history  . Blood transfusion without reported diagnosis 2012   with colon surgery  . Chronic headaches   . Chronic kidney disease 2014   20% kidney function failure per patient  acute episode per patient,   . Family history of breast cancer   . Family history of colon cancer   . Family history of malignant neoplasm of gastrointestinal tract     . Family history of prostate cancer   . Headache(784.0)    migraines  . Insomnia   . Rectal cancer, T1N0 s/p LAR 12/02/2010 11/01/2010    Family History  Problem Relation Age of Onset  . Heart disease Mother   . Heart disease Father        pacemaker  . Skin cancer Father        basal cell  . Colon cancer Paternal Grandmother 13  . Colon cancer Paternal Grandfather 21  . Colon cancer Maternal Grandmother 10  . Cancer Cousin 55       sarcoma of the chest wall  . Prostate cancer Cousin 50       metastatic  . Breast cancer Other   . Stomach cancer Neg Hx   . Colon polyps Neg Hx   . Rectal cancer Neg Hx   . Esophageal cancer Neg Hx     Past Surgical History:  Procedure Laterality Date  . COLON RESECTION  12/02/2010   Procedure: COLON RESECTION LAPAROSCOPIC;  Surgeon: Adin Hector, MD;  Location: [facility];  Service: General;  Laterality: N/A;  Laparoscopic Low Anterior Resection, Rigid Proctoscopy  . COLON SURGERY  02/2016   rectal cancer  . COLONOSCOPY     multiple d/t rectal cancer dx  . PARTIAL PROCTECTOMY BY TEM N/A 02/29/2016   Procedure: PARTIAL PROCTECTOMY BY TEM OF RECTAL MASS ERAS PATHWAY;  Surgeon: Michael Boston, MD;  Location: [facility];  Service: General;  Laterality: N/A;  . POLYPECTOMY    . TONSILLECTOMY AND ADENOIDECTOMY     age 63  . WISDOM TOOTH EXTRACTION     Social History   Occupational History    Employer: [employer]  Tobacco Use  . Smoking status: Never Smoker  . Smokeless tobacco: Never Used  Substance and Sexual Activity  . Alcohol use: Yes    Alcohol/week: 0.0 standard drinks    Comment: seldom    . Drug use: No  . Sexual activity: Yes    Birth control/protection: Post-menopausal

## 2019-02-05 ENCOUNTER — Telehealth: Payer: Self-pay | Admitting: Internal Medicine

## 2019-02-05 DIAGNOSIS — Z03818 Encounter for observation for suspected exposure to other biological agents ruled out: Secondary | ICD-10-CM | POA: Diagnosis not present

## 2019-02-05 NOTE — Telephone Encounter (Signed)
Pt states that she read an article that mentions that Covid vaccine might not be good for people who had colon cancer. Pt is scheduled to receive her first dose of vaccine this Friday but would like to know Dr. Vena Rua advise on that. Pls call her.

## 2019-02-05 NOTE — Telephone Encounter (Signed)
Pt aware.

## 2019-02-05 NOTE — Telephone Encounter (Signed)
See note below and advise. 

## 2019-02-05 NOTE — Telephone Encounter (Signed)
I am unaware of such data. My recommendation is that she proceed with vaccination as scheduled. In my opinion the risk of vaccine complication is far exceeded by the risk of COVID-19 infection

## 2019-02-14 ENCOUNTER — Ambulatory Visit
Admission: RE | Admit: 2019-02-14 | Discharge: 2019-02-14 | Disposition: A | Discharge status: Home / Self Care | Payer: Medicare Other | Source: Ambulatory Visit | Attending: Orthopaedic Surgery | Admitting: Orthopaedic Surgery

## 2019-02-14 DIAGNOSIS — G8929 Other chronic pain: Secondary | ICD-10-CM

## 2019-02-14 DIAGNOSIS — M25562 Pain in left knee: Secondary | ICD-10-CM

## 2019-02-17 ENCOUNTER — Telehealth: Payer: Self-pay

## 2019-02-17 ENCOUNTER — Encounter: Payer: Self-pay | Admitting: Orthopaedic Surgery

## 2019-02-17 ENCOUNTER — Ambulatory Visit (INDEPENDENT_AMBULATORY_CARE_PROVIDER_SITE_OTHER): Payer: Medicare Other | Admitting: Orthopaedic Surgery

## 2019-02-17 ENCOUNTER — Other Ambulatory Visit: Payer: Self-pay

## 2019-02-17 DIAGNOSIS — G8929 Other chronic pain: Secondary | ICD-10-CM

## 2019-02-17 DIAGNOSIS — M25562 Pain in left knee: Secondary | ICD-10-CM | POA: Diagnosis not present

## 2019-02-17 DIAGNOSIS — M1712 Unilateral primary osteoarthritis, left knee: Secondary | ICD-10-CM

## 2019-02-17 NOTE — Telephone Encounter (Signed)
Left knee gel injection ?

## 2019-02-17 NOTE — Telephone Encounter (Signed)
Submitted for VOB for synvisc one- left knee 

## 2019-02-17 NOTE — Progress Notes (Signed)
The patient comes in to go over an MRI of her left knee.  She continues to have pain along the medial joint line but no locking and catching.  It mainly just wakes her up at night.  She has had at least 2 steroid injections in that left knee so at this point we sent her for a MRI to rule out a meniscal tear and to assess the cartilage in her knee.  She is still having the same symptoms with no locking and catching.  It only wakes her up at night.  On exam she has a negative McMurray's exam to the left knee medial compartment but there is medial compartment tenderness with flexion and extension.  MRIs reviewed with her and it does show signal changes in the posterior horn of the meniscus which can suggest a meniscal tear but there is no gross tear seen and there is only mild cartilage thinning in the medial compartment of the knee.  At this point have recommended over-the-counter Voltaren gel to apply to the medial compartment of her left knee and I do feel that she is an appropriate candidate for hyaluronic acid to treat the arthritis pain that she is feeling in her left knee.  I did give her handout about this and we will order this for her and hopefully place it in her knee in about 3 weeks from now.  All question concerns were answered and addressed.

## 2019-02-18 ENCOUNTER — Telehealth: Payer: Self-pay

## 2019-02-18 NOTE — Telephone Encounter (Signed)
Approved for Synvisc one- left knee Dr. Minerva Areola 03/10/19 [employer] and Bill No Copay No OOP-2nd insurance to pick up cost No prior auth required

## 2019-02-19 DIAGNOSIS — L821 Other seborrheic keratosis: Secondary | ICD-10-CM | POA: Diagnosis not present

## 2019-02-19 DIAGNOSIS — Z03818 Encounter for observation for suspected exposure to other biological agents ruled out: Secondary | ICD-10-CM | POA: Diagnosis not present

## 2019-02-19 DIAGNOSIS — D229 Melanocytic nevi, unspecified: Secondary | ICD-10-CM | POA: Diagnosis not present

## 2019-02-19 DIAGNOSIS — L918 Other hypertrophic disorders of the skin: Secondary | ICD-10-CM | POA: Diagnosis not present

## 2019-03-03 ENCOUNTER — Telehealth: Payer: Self-pay | Admitting: Orthopaedic Surgery

## 2019-03-03 NOTE — Telephone Encounter (Signed)
FYI, I left a message with her letting her know she could re-schedule as needed

## 2019-03-03 NOTE — Telephone Encounter (Signed)
Patient called.   She would like a call back to discuss an update on her condition. The topical ointment she was given is working and she wants to know if another appointment is still necessary.   Call back number: [phone]

## 2019-03-10 ENCOUNTER — Ambulatory Visit: Payer: Medicare Other | Admitting: Orthopaedic Surgery

## 2019-03-25 ENCOUNTER — Telehealth: Payer: Self-pay | Admitting: Orthopaedic Surgery

## 2019-03-25 NOTE — Telephone Encounter (Signed)
Dr Ninfa Linden is ok with this.

## 2019-03-25 NOTE — Telephone Encounter (Signed)
Pt called in said she had an appt scheduled to receive a Synvisc one injection 3/8 but she canceled due to feeling well at the time but pt is now having pain again and would like to receive that injection, okay to reschedule?   [phone]

## 2019-03-25 NOTE — Telephone Encounter (Signed)
Patient wanted to make sure that you were ok with this?

## 2019-03-25 NOTE — Telephone Encounter (Signed)
That will be fine. 

## 2019-03-25 NOTE — Telephone Encounter (Signed)
I spoke with the pt and she stated she is traveling to [place] in April. She will be back April 9th She wanted to know if she can schedule an appointment with Dr. Ninfa Linden on April 12th? Please advise

## 2019-03-25 NOTE — Telephone Encounter (Signed)
Pt informed and stated understanding. Appt scheduled for 4/12

## 2019-03-28 ENCOUNTER — Ambulatory Visit: Payer: Medicare Other | Attending: Internal Medicine

## 2019-03-28 DIAGNOSIS — Z23 Encounter for immunization: Secondary | ICD-10-CM

## 2019-03-28 NOTE — Progress Notes (Signed)
   Covid-19 Vaccination Clinic  Name:  BLIMIE BACHAND    MRN: [id number] DOB: 01-Mar-1950  03/28/2019  Ms. Smee was observed post Covid-19 immunization for 15 minutes without incident. She was provided with Vaccine Information Sheet and instruction to access the V-Safe system.   Ms. Mawby was instructed to call 911 with any severe reactions post vaccine: Marland Kitchen Difficulty breathing  . Swelling of face and throat  . A fast heartbeat  . A bad rash all over body  . Dizziness and weakness   Immunizations Administered    Name Date Dose VIS Date Route   Moderna COVID-19 Vaccine 03/28/2019 10:32 AM 0.5 mL 12/03/2018 Intramuscular   Manufacturer: Moderna   Lot: [id number]   [id number]

## 2019-04-14 ENCOUNTER — Ambulatory Visit (INDEPENDENT_AMBULATORY_CARE_PROVIDER_SITE_OTHER): Payer: Medicare Other | Admitting: Orthopaedic Surgery

## 2019-04-14 ENCOUNTER — Encounter: Payer: Self-pay | Admitting: Orthopaedic Surgery

## 2019-04-14 ENCOUNTER — Other Ambulatory Visit: Payer: Self-pay

## 2019-04-14 DIAGNOSIS — Z03818 Encounter for observation for suspected exposure to other biological agents ruled out: Secondary | ICD-10-CM | POA: Diagnosis not present

## 2019-04-14 DIAGNOSIS — M1712 Unilateral primary osteoarthritis, left knee: Secondary | ICD-10-CM

## 2019-04-14 DIAGNOSIS — M25562 Pain in left knee: Secondary | ICD-10-CM

## 2019-04-14 DIAGNOSIS — G8929 Other chronic pain: Secondary | ICD-10-CM

## 2019-04-14 MED ORDER — HYLAN G-F 20 48 MG/6ML IX SOSY
48.0000 mg | PREFILLED_SYRINGE | INTRA_ARTICULAR | Status: AC | PRN
  Administered 2019-04-14: 48 mg via INTRA_ARTICULAR

## 2019-04-14 NOTE — Progress Notes (Signed)
   Procedure Note  Patient: Natasha Campbell             Date of Birth: [birth year]           MRN: [id number]             Visit Date: 04/14/2019  Procedures: Visit Diagnoses:  1. Chronic pain of left knee   2. Unilateral primary osteoarthritis, left knee     Large Joint Inj: L knee on 04/14/2019 10:34 AM Indications: pain and diagnostic evaluation Details: 22 G 1.5 in needle, superolateral approach  Arthrogram: No  Medications: 48 mg Hylan 48 MG/6ML Outcome: tolerated well, no immediate complications Procedure, treatment alternatives, risks and benefits explained, specific risks discussed. Consent was given by the patient. Immediately prior to procedure a time out was called to verify the correct patient, procedure, equipment, support staff and site/side marked as required. Patient was prepped and draped in the usual sterile fashion.    The patient is here today for scheduled hyaluronic acid injection with Synvisc 1 in her left knee to treat the pain from osteoarthritis.  She has tried about other forms of treatment measures including steroid injection.  She does have osteoarthritis in that left knee and this is the next option for her treatment wise.  She wants to try this as well.  The risk and benefits of these types of injections were explained in detail.  There is no knee joint effusion today and the knee is ligamentously stable with good range of motion but does have painful patellofemoral crepitation and medial joint line tenderness.  I did place Synvisc 1 in her knee without difficulty.  She did let me know she is leaving that she has been dealing with left-sided ulnar wrist pain that is been getting worse slowly after injuring this wrist while renovating a house back in November.  I have recommended Voltaren gel to try for the next 2 weeks.  If just not getting better she is scheduled come see me so I can potentially place a steroid injection in her left wrist.  All question concerns were  otherwise answered and addressed.

## 2019-04-29 DIAGNOSIS — G43709 Chronic migraine without aura, not intractable, without status migrainosus: Secondary | ICD-10-CM | POA: Diagnosis not present

## 2019-04-30 ENCOUNTER — Ambulatory Visit: Payer: Medicare Other | Attending: Internal Medicine

## 2019-04-30 ENCOUNTER — Other Ambulatory Visit: Payer: Self-pay

## 2019-04-30 DIAGNOSIS — Z23 Encounter for immunization: Secondary | ICD-10-CM

## 2019-04-30 NOTE — Progress Notes (Signed)
   Covid-19 Vaccination Clinic  Name:  Natasha Campbell    MRN: [id number] DOB: 09-21-1950  04/30/2019  Natasha Campbell was observed post Covid-19 immunization for 15 minutes without incident. She was provided with Vaccine Information Sheet and instruction to access the V-Safe system.   Natasha Campbell was instructed to call 911 with any severe reactions post vaccine: Marland Kitchen Difficulty breathing  . Swelling of face and throat  . A fast heartbeat  . A bad rash all over body  . Dizziness and weakness   Immunizations Administered    Name Date Dose VIS Date Route   Moderna COVID-19 Vaccine 04/30/2019 10:58 AM 0.5 mL 12/2018 Intramuscular   Manufacturer: Moderna   Lot: [id number]   [id number]

## 2019-05-06 DIAGNOSIS — M79674 Pain in right toe(s): Secondary | ICD-10-CM | POA: Diagnosis not present

## 2019-05-06 DIAGNOSIS — M8580 Other specified disorders of bone density and structure, unspecified site: Secondary | ICD-10-CM | POA: Diagnosis not present

## 2019-05-23 DIAGNOSIS — H40013 Open angle with borderline findings, low risk, bilateral: Secondary | ICD-10-CM | POA: Diagnosis not present

## 2019-06-30 ENCOUNTER — Encounter: Payer: Self-pay | Admitting: Podiatry

## 2019-06-30 ENCOUNTER — Ambulatory Visit (INDEPENDENT_AMBULATORY_CARE_PROVIDER_SITE_OTHER): Payer: Medicare Other | Admitting: Podiatry

## 2019-06-30 ENCOUNTER — Other Ambulatory Visit: Payer: Self-pay

## 2019-06-30 DIAGNOSIS — M779 Enthesopathy, unspecified: Secondary | ICD-10-CM | POA: Diagnosis not present

## 2019-06-30 DIAGNOSIS — M2041 Other hammer toe(s) (acquired), right foot: Secondary | ICD-10-CM | POA: Diagnosis not present

## 2019-07-02 NOTE — Progress Notes (Signed)
Subjective:   Patient ID: Natasha Campbell, female   DOB: 69 y.o.   MRN: [id number]   HPI Patient presents stating her third toe has been swollen and painful and she does not remember injury. 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 Patient has had blood work which was negative for signs of systemic arthritis currently and does not smoke and likes to be active   Review of Systems  All other systems reviewed and are negative.       Objective:  Physical Exam Vitals and nursing note reviewed.  Constitutional:      Appearance: She is well-developed.  Pulmonary:     Effort: Pulmonary effort is normal.  Musculoskeletal:        General: Normal range of motion.  Skin:    General: Skin is warm.  Neurological:     Mental Status: She is alert.     Neurovascular status is intact muscle strength found to be adequate. Range of motion within normal limits with patient found to have a swollen distal interphalangeal joint of the third toe right that is painful and making walking difficult. Patient does not remember specific injury and states it is worse in shoes and does have good digital perfusion well oriented x3     Assessment:  Probability that this is a osteoarthritic condition of the joint surface versus the possibility of a subtle trauma that she was not aware of     Plan:  H&P reviewed condition. At this point I did do a proximal nerve block and I then after appropriate numbness I did inject around the joint with 2 mg Dexasone Kenalog and advised on wider shoes. If symptoms persist we will need to consider distal arthroplasty but I made her aware of today and I educated her on chronic arthritis of this joint surface. Patient will be seen back and I reviewed the x-rays she brought indicating what appears to be signs of osteoarthritis of the distal joint third right

## 2019-07-18 ENCOUNTER — Encounter: Payer: Self-pay | Admitting: Internal Medicine

## 2019-08-27 DIAGNOSIS — Z20822 Contact with and (suspected) exposure to covid-19: Secondary | ICD-10-CM | POA: Diagnosis not present

## 2019-09-01 ENCOUNTER — Encounter: Payer: Self-pay | Admitting: Internal Medicine

## 2019-09-01 NOTE — Telephone Encounter (Signed)
OPENED IN ERROR

## 2019-09-12 ENCOUNTER — Other Ambulatory Visit: Payer: Self-pay

## 2019-09-12 ENCOUNTER — Ambulatory Visit (AMBULATORY_SURGERY_CENTER): Payer: Self-pay

## 2019-09-12 VITALS — Ht 66.0 in | Wt 141.0 lb

## 2019-09-12 DIAGNOSIS — Z85048 Personal history of other malignant neoplasm of rectum, rectosigmoid junction, and anus: Secondary | ICD-10-CM

## 2019-09-12 DIAGNOSIS — Z86010 Personal history of colonic polyps: Secondary | ICD-10-CM

## 2019-09-12 NOTE — Progress Notes (Signed)
No egg or soy allergy known to patient  No issues with past sedation with any surgeries or procedures no intubation problems in the past  No FH of Malignant Hyperthermia No diet pills per patient No home 02 use per patient  No blood thinners per patient  Pt denies issues with constipation  No A fib or A flutter  EMMI video to pt or via [place] 19 guidelines implemented in PV today with Pt and RN   Miralax prep given due to cost.    Due to the COVID-19 pandemic we are asking patients to follow these guidelines. Please only bring one care partner. Please be aware that your care partner may wait in the car in the parking lot or if they feel like they will be too hot to wait in the car, they may wait in the lobby on the 4th floor. All care partners are required to wear a mask the entire time (we do not have any that we can provide them), they need to practice social distancing, and we will do a Covid check for all patient's and care partners when you arrive. Also we will check their temperature and your temperature. If the care partner waits in their car they need to stay in the parking lot the entire time and we will call them on their cell phone when the patient is ready for discharge so they can bring the car to the front of the building. Also all patient's will need to wear a mask into building.

## 2019-09-26 DIAGNOSIS — M859 Disorder of bone density and structure, unspecified: Secondary | ICD-10-CM | POA: Diagnosis not present

## 2019-09-26 DIAGNOSIS — E785 Hyperlipidemia, unspecified: Secondary | ICD-10-CM | POA: Diagnosis not present

## 2019-09-29 ENCOUNTER — Encounter: Payer: Self-pay | Admitting: Certified Registered Nurse Anesthetist

## 2019-09-29 DIAGNOSIS — R82998 Other abnormal findings in urine: Secondary | ICD-10-CM | POA: Diagnosis not present

## 2019-09-29 DIAGNOSIS — E785 Hyperlipidemia, unspecified: Secondary | ICD-10-CM | POA: Diagnosis not present

## 2019-09-30 ENCOUNTER — Encounter: Payer: Self-pay | Admitting: Internal Medicine

## 2019-09-30 ENCOUNTER — Other Ambulatory Visit: Payer: Self-pay

## 2019-09-30 ENCOUNTER — Ambulatory Visit (AMBULATORY_SURGERY_CENTER): Payer: Medicare Other | Admitting: Internal Medicine

## 2019-09-30 VITALS — BP 135/78 | HR 66 | Temp 96.8°F | Resp 10 | Ht 66.0 in | Wt 141.0 lb

## 2019-09-30 DIAGNOSIS — N189 Chronic kidney disease, unspecified: Secondary | ICD-10-CM | POA: Diagnosis not present

## 2019-09-30 DIAGNOSIS — Z86010 Personal history of colonic polyps: Secondary | ICD-10-CM

## 2019-09-30 DIAGNOSIS — Z85048 Personal history of other malignant neoplasm of rectum, rectosigmoid junction, and anus: Secondary | ICD-10-CM

## 2019-09-30 DIAGNOSIS — K621 Rectal polyp: Secondary | ICD-10-CM

## 2019-09-30 DIAGNOSIS — D128 Benign neoplasm of rectum: Secondary | ICD-10-CM

## 2019-09-30 MED ORDER — SODIUM CHLORIDE 0.9 % IV SOLN: 500.0000 mL | Freq: Once | INTRAVENOUS | Status: DC

## 2019-09-30 NOTE — Patient Instructions (Addendum)
You may resume your current medications today. Await biopsy results. Usually takes 2-3 weeks to receive pathology results. Please call if any questions or concerns.     YOU HAD AN ENDOSCOPIC PROCEDURE TODAY AT [facility]:   Refer to the procedure report that was given to you for any specific questions about what was found during the examination.  If the procedure report does not answer your questions, please call your gastroenterologist to clarify.  If you requested that your care partner not be given the details of your procedure findings, then the procedure report has been included in a sealed envelope for you to review at your convenience later.  YOU SHOULD EXPECT: Some feelings of bloating in the abdomen. Passage of more gas than usual.  Walking can help get rid of the air that was put into your GI tract during the procedure and reduce the bloating. If you had a lower endoscopy (such as a colonoscopy or flexible sigmoidoscopy) you may notice spotting of blood in your stool or on the toilet paper. If you underwent a bowel prep for your procedure, you may not have a normal bowel movement for a few days.  Please Note:  You might notice some irritation and congestion in your nose or some drainage.  This is from the oxygen used during your procedure.  There is no need for concern and it should clear up in a day or so.  SYMPTOMS TO REPORT IMMEDIATELY:   Following lower endoscopy (colonoscopy or flexible sigmoidoscopy):  Excessive amounts of blood in the stool  Significant tenderness or worsening of abdominal pains  Swelling of the abdomen that is new, acute  Fever of 100F or higher   For urgent or emergent issues, a gastroenterologist can be reached at any hour by calling [phone]. Do not use MyChart messaging for urgent concerns.    DIET:  We do recommend a small meal at first, but then you may proceed to your regular diet.  Drink plenty of fluids but you should  avoid alcoholic beverages for 24 hours.  ACTIVITY:  You should plan to take it easy for the rest of today and you should NOT DRIVE or use heavy machinery until tomorrow (because of the sedation medicines used during the test).    FOLLOW UP: Our staff will call the number listed on your records 48-72 hours following your procedure to check on you and address any questions or concerns that you may have regarding the information given to you following your procedure. If we do not reach you, we will leave a message.  We will attempt to reach you two times.  During this call, we will ask if you have developed any symptoms of COVID 19. If you develop any symptoms (ie: fever, flu-like symptoms, shortness of breath, cough etc.) before then, please call [phone].  If you test positive for Covid 19 in the 2 weeks post procedure, please call and report this information to [place].    If any biopsies were taken you will be contacted by phone or by letter within the next 1-3 weeks.  Please call us at [phone] if you have not heard about the biopsies in 3 weeks.    SIGNATURES/CONFIDENTIALITY: You and/or your care partner have signed paperwork which will be entered into your electronic medical record.  These signatures attest to the fact that that the information above on your After Visit Summary has been reviewed and is understood.  Full responsibility of the  confidentiality of this discharge information lies with you and/or your care-partner.

## 2019-09-30 NOTE — Op Note (Signed)
[facility] Patient Name: Natasha Campbell Procedure Date: 09/30/2019 10:33 AM MRN: [id number] Endoscopist: Jerene Bears , MD Age: 69 Referring MD:  Date of Birth: 03-22-1950 Gender: Female Account #: [id number] Procedure:                Colonoscopy Indications:              High risk colon cancer surveillance: Personal                            history of rectosigmoid colon cancer status post                            resection with colocolonic anastomosis, subsequent                            tubulovillous adenoma with a foci of adenocarcinoma                            in the distal rectum status post transanal                            resection March 2018, Last colonoscopy: July 2020                            with removal of small adenomatous and sessile                            serrated colon polyps Medicines:                Monitored Anesthesia Care Procedure:                Pre-Anesthesia Assessment:                           - Prior to the procedure, a History and Physical                            was performed, and patient medications and                            allergies were reviewed. The patient's tolerance of                            previous anesthesia was also reviewed. The risks                            and benefits of the procedure and the sedation                            options and risks were discussed with the patient.                            All questions were answered, and informed consent  was obtained. Prior Anticoagulants: The patient has                            taken no previous anticoagulant or antiplatelet                            agents. ASA Grade Assessment: II - A patient with                            mild systemic disease. After reviewing the risks                            and benefits, the patient was deemed in                            satisfactory condition to undergo the procedure.                            After obtaining informed consent, the colonoscope                            was passed under direct vision. Throughout the                            procedure, the patient's blood pressure, pulse, and                            oxygen saturations were monitored continuously. The                            Colonoscope was introduced through the anus and                            advanced to the terminal ileum. The colonoscopy was                            performed without difficulty. The patient tolerated                            the procedure well. The quality of the bowel                            preparation was good. The terminal ileum, ileocecal                            valve, appendiceal orifice, and rectum were                            photographed. Scope In: 57:84:69 AM Scope Out: 11:04:28 AM Scope Withdrawal Time: 0 hours 14 minutes 9 seconds  Total Procedure Duration: 0 hours 18 minutes 10 seconds  Findings:                 The digital rectal exam was normal. There is  scarring in the posterior anal canal without                            stenosis.                           The terminal ileum appeared normal.                           There was evidence of a prior end-to-end                            colo-colonic anastomosis in the proximal rectum.                            This was patent and was characterized by healthy                            appearing mucosa.                           An area of nodular mucosa was found in the distal                            rectum. This is immediately above and approximates                            the dentate line. This was examined under white and                            narrowband imaging. Biopsies were taken with a cold                            forceps for histology to exclude adenoma.                           Retroflexion in the rectum was not performed due to                             post-surgical anatomy. Complications:            No immediate complications. Estimated Blood Loss:     Estimated blood loss was minimal. Impression:               - The examined portion of the ileum was normal.                           - Patent end-to-end colo-colonic anastomosis,                            characterized by healthy appearing mucosa.                           - Nodular mucosa in the distal rectum (as described  above). Biopsied. Recommendation:           - Patient has a contact number available for                            emergencies. The signs and symptoms of potential                            delayed complications were discussed with the                            patient. Return to normal activities tomorrow.                            Written discharge instructions were provided to the                            patient.                           - Resume previous diet.                           - Continue present medications.                           - Await pathology results.                           - Repeat colonoscopy is recommended for                            surveillance. The colonoscopy date will be                            determined after pathology results from today's                            exam become available for review. Jerene Bears, MD 09/30/2019 11:13:58 AM This report has been signed electronically.

## 2019-09-30 NOTE — Progress Notes (Signed)
Pt requested her husband be at the bedside while she was in the recovery room.  Per Dr. Hilarie Fredrickson, bring pt's husband to recovery room.  Dr. Hilarie Fredrickson went over findings with pt and her husband.  I went over discharge instructions with both as well.  No problems noted in the recovery room. maw

## 2019-09-30 NOTE — Progress Notes (Signed)
Called to room to assist during endoscopic procedure.  Patient ID and intended procedure confirmed with present staff. Received instructions for my participation in the procedure from the performing physician.  

## 2019-09-30 NOTE — Progress Notes (Signed)
Report given to PACU, vss 

## 2019-09-30 NOTE — Progress Notes (Signed)
Vital signs checked by: SF  The patient states no changes in medical or surgical history since pre-visit screening on 09/12/19.

## 2019-10-02 ENCOUNTER — Telehealth: Payer: Self-pay

## 2019-10-02 NOTE — Telephone Encounter (Signed)
°  Follow up Call-  Call back number 09/30/2019 08/02/2018 07/13/2017  Post procedure Call Back phone  # [phone]  Permission to leave phone message Yes Yes Yes  Some recent data might be hidden     Patient questions:  Do you have a fever, pain , or abdominal swelling? No. Pain Score  0 *  Have you tolerated food without any problems? Yes.    Have you been able to return to your normal activities? Yes.    Do you have any questions about your discharge instructions: Diet   No. Medications  No. Follow up visit  No.  Do you have questions or concerns about your Care? No.  Actions: * If pain score is 4 or above: No action needed, pain <4.  1. Have you developed a fever since your procedure? no  2.   Have you had an respiratory symptoms (SOB or cough) since your procedure? no  3.   Have you tested positive for COVID 19 since your procedure no  4.   Have you had any family members/close contacts diagnosed with the COVID 19 since your procedure?  no   If yes to any of these questions please route to Joylene John, RN and Joella Prince, RN

## 2019-10-03 ENCOUNTER — Encounter: Payer: Self-pay | Admitting: Internal Medicine

## 2019-10-03 DIAGNOSIS — Z0000 Encounter for general adult medical examination without abnormal findings: Secondary | ICD-10-CM | POA: Diagnosis not present

## 2019-10-03 DIAGNOSIS — R600 Localized edema: Secondary | ICD-10-CM | POA: Diagnosis not present

## 2019-10-03 DIAGNOSIS — N2581 Secondary hyperparathyroidism of renal origin: Secondary | ICD-10-CM | POA: Diagnosis not present

## 2019-10-03 DIAGNOSIS — N1831 Chronic kidney disease, stage 3a: Secondary | ICD-10-CM | POA: Diagnosis not present

## 2019-10-03 DIAGNOSIS — E785 Hyperlipidemia, unspecified: Secondary | ICD-10-CM | POA: Diagnosis not present

## 2019-10-03 DIAGNOSIS — Z23 Encounter for immunization: Secondary | ICD-10-CM | POA: Diagnosis not present

## 2019-10-03 DIAGNOSIS — M859 Disorder of bone density and structure, unspecified: Secondary | ICD-10-CM | POA: Diagnosis not present

## 2019-11-20 DIAGNOSIS — Z6824 Body mass index (BMI) 24.0-24.9, adult: Secondary | ICD-10-CM | POA: Diagnosis not present

## 2019-11-20 DIAGNOSIS — Z01419 Encounter for gynecological examination (general) (routine) without abnormal findings: Secondary | ICD-10-CM | POA: Diagnosis not present

## 2019-11-21 DIAGNOSIS — M2041 Other hammer toe(s) (acquired), right foot: Secondary | ICD-10-CM | POA: Diagnosis not present

## 2019-11-21 DIAGNOSIS — M79671 Pain in right foot: Secondary | ICD-10-CM | POA: Diagnosis not present

## 2019-11-21 DIAGNOSIS — M2040 Other hammer toe(s) (acquired), unspecified foot: Secondary | ICD-10-CM | POA: Insufficient documentation

## 2019-11-21 DIAGNOSIS — M25552 Pain in left hip: Secondary | ICD-10-CM | POA: Insufficient documentation

## 2019-12-05 DIAGNOSIS — M25562 Pain in left knee: Secondary | ICD-10-CM | POA: Diagnosis not present

## 2019-12-05 DIAGNOSIS — M79671 Pain in right foot: Secondary | ICD-10-CM | POA: Diagnosis not present

## 2019-12-05 DIAGNOSIS — M25552 Pain in left hip: Secondary | ICD-10-CM | POA: Diagnosis not present

## 2019-12-16 DIAGNOSIS — M25552 Pain in left hip: Secondary | ICD-10-CM | POA: Diagnosis not present

## 2019-12-16 DIAGNOSIS — M25562 Pain in left knee: Secondary | ICD-10-CM | POA: Diagnosis not present

## 2019-12-16 DIAGNOSIS — M79671 Pain in right foot: Secondary | ICD-10-CM | POA: Diagnosis not present

## 2019-12-23 DIAGNOSIS — M25552 Pain in left hip: Secondary | ICD-10-CM | POA: Diagnosis not present

## 2019-12-23 DIAGNOSIS — M25562 Pain in left knee: Secondary | ICD-10-CM | POA: Diagnosis not present

## 2019-12-23 DIAGNOSIS — M79671 Pain in right foot: Secondary | ICD-10-CM | POA: Diagnosis not present

## 2020-02-20 ENCOUNTER — Telehealth: Payer: Self-pay | Admitting: Orthopaedic Surgery

## 2020-02-20 NOTE — Telephone Encounter (Signed)
Patient would like CD of knee xrys. Call when ready. [phone]

## 2020-02-20 NOTE — Telephone Encounter (Signed)
Advised patient CD ready for pick up at her convenience.

## 2020-02-25 ENCOUNTER — Ambulatory Visit: Payer: Medicare Other | Admitting: Dermatology

## 2020-06-23 DIAGNOSIS — L821 Other seborrheic keratosis: Secondary | ICD-10-CM | POA: Diagnosis not present

## 2020-06-23 DIAGNOSIS — N1831 Chronic kidney disease, stage 3a: Secondary | ICD-10-CM | POA: Diagnosis not present

## 2020-06-23 DIAGNOSIS — J302 Other seasonal allergic rhinitis: Secondary | ICD-10-CM | POA: Diagnosis not present

## 2020-06-23 DIAGNOSIS — R600 Localized edema: Secondary | ICD-10-CM | POA: Diagnosis not present

## 2020-07-01 DIAGNOSIS — Z1231 Encounter for screening mammogram for malignant neoplasm of breast: Secondary | ICD-10-CM | POA: Diagnosis not present

## 2020-08-26 DIAGNOSIS — M79642 Pain in left hand: Secondary | ICD-10-CM | POA: Diagnosis not present

## 2020-08-26 DIAGNOSIS — M19049 Primary osteoarthritis, unspecified hand: Secondary | ICD-10-CM | POA: Insufficient documentation

## 2020-08-26 DIAGNOSIS — M79641 Pain in right hand: Secondary | ICD-10-CM | POA: Diagnosis not present

## 2020-08-26 DIAGNOSIS — M13849 Other specified arthritis, unspecified hand: Secondary | ICD-10-CM | POA: Diagnosis not present

## 2020-08-27 DIAGNOSIS — M7062 Trochanteric bursitis, left hip: Secondary | ICD-10-CM | POA: Diagnosis not present

## 2020-08-27 DIAGNOSIS — S83242A Other tear of medial meniscus, current injury, left knee, initial encounter: Secondary | ICD-10-CM | POA: Diagnosis not present

## 2020-08-30 DIAGNOSIS — J300 Vasomotor rhinitis: Secondary | ICD-10-CM | POA: Diagnosis not present

## 2020-08-30 DIAGNOSIS — R059 Cough, unspecified: Secondary | ICD-10-CM | POA: Diagnosis not present

## 2020-08-30 DIAGNOSIS — J309 Allergic rhinitis, unspecified: Secondary | ICD-10-CM | POA: Diagnosis not present

## 2020-08-30 DIAGNOSIS — H1045 Other chronic allergic conjunctivitis: Secondary | ICD-10-CM | POA: Diagnosis not present

## 2020-09-06 DIAGNOSIS — S83249A Other tear of medial meniscus, current injury, unspecified knee, initial encounter: Secondary | ICD-10-CM | POA: Insufficient documentation

## 2020-09-06 DIAGNOSIS — M7062 Trochanteric bursitis, left hip: Secondary | ICD-10-CM | POA: Insufficient documentation

## 2020-10-27 DIAGNOSIS — M859 Disorder of bone density and structure, unspecified: Secondary | ICD-10-CM | POA: Diagnosis not present

## 2020-10-27 DIAGNOSIS — E785 Hyperlipidemia, unspecified: Secondary | ICD-10-CM | POA: Diagnosis not present

## 2020-10-27 DIAGNOSIS — N2581 Secondary hyperparathyroidism of renal origin: Secondary | ICD-10-CM | POA: Diagnosis not present

## 2020-11-02 DIAGNOSIS — G4700 Insomnia, unspecified: Secondary | ICD-10-CM | POA: Diagnosis not present

## 2020-11-02 DIAGNOSIS — M8580 Other specified disorders of bone density and structure, unspecified site: Secondary | ICD-10-CM | POA: Diagnosis not present

## 2020-11-02 DIAGNOSIS — N2581 Secondary hyperparathyroidism of renal origin: Secondary | ICD-10-CM | POA: Diagnosis not present

## 2020-11-02 DIAGNOSIS — Z1339 Encounter for screening examination for other mental health and behavioral disorders: Secondary | ICD-10-CM | POA: Diagnosis not present

## 2020-11-02 DIAGNOSIS — L989 Disorder of the skin and subcutaneous tissue, unspecified: Secondary | ICD-10-CM | POA: Diagnosis not present

## 2020-11-02 DIAGNOSIS — Z1331 Encounter for screening for depression: Secondary | ICD-10-CM | POA: Diagnosis not present

## 2020-11-02 DIAGNOSIS — E785 Hyperlipidemia, unspecified: Secondary | ICD-10-CM | POA: Diagnosis not present

## 2020-11-02 DIAGNOSIS — Z0000 Encounter for general adult medical examination without abnormal findings: Secondary | ICD-10-CM | POA: Diagnosis not present

## 2020-11-02 DIAGNOSIS — Z23 Encounter for immunization: Secondary | ICD-10-CM | POA: Diagnosis not present

## 2020-11-02 DIAGNOSIS — N1831 Chronic kidney disease, stage 3a: Secondary | ICD-10-CM | POA: Diagnosis not present

## 2020-11-17 ENCOUNTER — Other Ambulatory Visit: Payer: Self-pay

## 2020-11-17 ENCOUNTER — Ambulatory Visit (INDEPENDENT_AMBULATORY_CARE_PROVIDER_SITE_OTHER): Payer: Medicare Other | Admitting: Dermatology

## 2020-11-17 ENCOUNTER — Encounter (INDEPENDENT_AMBULATORY_CARE_PROVIDER_SITE_OTHER): Payer: Self-pay

## 2020-11-17 ENCOUNTER — Encounter: Payer: Self-pay | Admitting: Dermatology

## 2020-11-17 DIAGNOSIS — L820 Inflamed seborrheic keratosis: Secondary | ICD-10-CM

## 2020-11-17 DIAGNOSIS — D485 Neoplasm of uncertain behavior of skin: Secondary | ICD-10-CM

## 2020-11-17 DIAGNOSIS — Z1283 Encounter for screening for malignant neoplasm of skin: Secondary | ICD-10-CM

## 2020-11-17 DIAGNOSIS — L720 Epidermal cyst: Secondary | ICD-10-CM

## 2020-11-17 DIAGNOSIS — L219 Seborrheic dermatitis, unspecified: Secondary | ICD-10-CM | POA: Diagnosis not present

## 2020-11-17 DIAGNOSIS — D3611 Benign neoplasm of peripheral nerves and autonomic nervous system of face, head, and neck: Secondary | ICD-10-CM | POA: Diagnosis not present

## 2020-11-17 NOTE — Patient Instructions (Signed)
Clotrimazole and 1% hydrocortisone. Mix them together. Apply to right ear.  Biopsy, Surgery (Curettage) & Surgery (Excision) Aftercare Instructions  1. Okay to remove bandage in 24 hours  2. Wash area with soap and water  3. Apply Vaseline to area twice daily until healed (Not Neosporin)  4. Okay to cover with a Band-Aid to decrease the chance of infection or prevent irritation from clothing; also it's okay to uncover lesion at home.  5. Suture instructions: return to our office in 7-10 or 10-14 days for a nurse visit for suture removal. Variable healing with sutures, if pain or itching occurs call our office. It's okay to shower or bathe 24 hours after sutures are given.  6. The following risks may occur after a biopsy, curettage or excision: bleeding, scarring, discoloration, recurrence, infection (redness, yellow drainage, pain or swelling).  7. For questions, concerns and results call our office at [facility] before 4pm & Friday before 3pm. Biopsy results will be available in 1 week.

## 2020-12-07 ENCOUNTER — Telehealth: Payer: Self-pay | Admitting: Dermatology

## 2020-12-07 NOTE — Telephone Encounter (Signed)
Patient left message on office voice mail that she was supposed to call in 3 weeks with an update on lesions that were treated.  Patient stated that the lesion on her neck has healed completely and that the lesion behind her right knee has scabbed and is healing well.

## 2020-12-08 ENCOUNTER — Encounter: Payer: Self-pay | Admitting: Internal Medicine

## 2020-12-13 ENCOUNTER — Encounter: Payer: Self-pay | Admitting: Dermatology

## 2020-12-13 NOTE — Progress Notes (Signed)
Follow-Up Visit   Subjective  Natasha Campbell is a 70 y.o. female who presents for the following: Skin Problem (Patient here today for yearly skin check. Patient here today for lesion on right posterior knee x she just noticed a few weeks ago. Per patient the lesion is dark, no bleeding, no pain. Per patient she does have a swollen toe on her right foot x 1.5 years and she's seen several Physicians that can't give her a diagnosis. No personal history of atypical moles. Melanoma or non mole skin cancer. Family history of non mole skin cancer. ).  Growths on right leg and back of neck. Location:  Duration:  Quality:  Associated Signs/Symptoms: Modifying Factors:  Severity:  Timing: Context:   Objective  Well appearing patient in no apparent distress; mood and affect are within normal limits. Right Knee - Anterior Partially detached inflamed 6 mm verrucous papule: Wart versus I SK versus SCCA.       Neck - Posterior Pearly 4 mm papule, rule out BCC       Chest - Medial (Center) 7 mm noninflamed epidermoid cyst  Left Ear, Right Ear Mild erythema with some scale  Mid Back Full body skin exam.  No atypical pigmented lesions    A full examination was performed including scalp, head, eyes, ears, nose, lips, neck, chest, axillae, abdomen, back, buttocks, bilateral upper extremities, bilateral lower extremities, hands, feet, fingers, toes, fingernails, and toenails. All findings within normal limits unless otherwise noted below.  Areas beneath undergarments not fully examined.   Assessment & Plan    Neoplasm of uncertain behavior of skin (2) Right Knee - Anterior  Skin / nail biopsy Type of biopsy: tangential   Informed consent: discussed and consent obtained   Timeout: patient name, date of birth, surgical site, and procedure verified   Anesthesia: the lesion was anesthetized in a standard fashion   Anesthetic:  1% lidocaine w/ epinephrine 1-100,000 local  infiltration Instrument used: flexible razor blade   Hemostasis achieved with: ferric subsulfate   Outcome: patient tolerated procedure well   Post-procedure details: wound care instructions given    Specimen 1 - Surgical pathology Differential Diagnosis: scc vs bcc  Check Margins: No  Neck - Posterior  Skin / nail biopsy Type of biopsy: tangential   Informed consent: discussed and consent obtained   Timeout: patient name, date of birth, surgical site, and procedure verified   Anesthesia: the lesion was anesthetized in a standard fashion   Anesthetic:  1% lidocaine w/ epinephrine 1-100,000 local infiltration Instrument used: flexible razor blade   Hemostasis achieved with: ferric subsulfate   Outcome: patient tolerated procedure well   Post-procedure details: wound care instructions given    Specimen 2 - Surgical pathology Differential Diagnosis: scc vs bcc  Check Margins: No  Epidermal cyst Chest - Medial (Center)  Discussed elective removal, no intervention for now  Seborrheic dermatitis Left Ear; Right Ear  OTC clotrimazole and 1% hydrocortisone nightly for 3 weeks  Screening for malignant neoplasm of skin Mid Back  Yearly skin exam.    [name] also pointed out a very subtle fullness on her distal dorsal medial right foot which has been evaluated by several doctors.  There is no inflammation and I do not feel any underlying growth.  We discussed gout and pseudogout which tend to be more episodic.  I do not believe this is cutaneous disease and feel that biopsy would be a low yield procedure.  She may choose to get  an opinion from a rheumatologist.   I, Natasha Monarch, MD, have reviewed all documentation for this visit.  The documentation on 12/13/20 for the exam, diagnosis, procedures, and orders are all accurate and complete.

## 2021-02-16 ENCOUNTER — Ambulatory Visit (AMBULATORY_SURGERY_CENTER): Payer: Self-pay | Admitting: *Deleted

## 2021-02-16 ENCOUNTER — Other Ambulatory Visit: Payer: Self-pay

## 2021-02-16 VITALS — Ht 65.25 in | Wt 130.0 lb

## 2021-02-16 DIAGNOSIS — Z86010 Personal history of colonic polyps: Secondary | ICD-10-CM

## 2021-02-16 DIAGNOSIS — Z85038 Personal history of other malignant neoplasm of large intestine: Secondary | ICD-10-CM

## 2021-02-16 DIAGNOSIS — Z85048 Personal history of other malignant neoplasm of rectum, rectosigmoid junction, and anus: Secondary | ICD-10-CM

## 2021-02-16 MED ORDER — NA SULFATE-K SULFATE-MG SULF 17.5-3.13-1.6 GM/177ML PO SOLN: 1.0000 | Freq: Once | ORAL | 0 refills | Status: AC

## 2021-02-16 NOTE — Progress Notes (Signed)
No egg or soy allergy known to patient  No issues known to pt with past sedation with any surgeries or procedures Patient denies ever being told they had issues or difficulty with intubation  No FH of Malignant Hyperthermia Pt is not on diet pills Pt is not on  home 02  Pt is not on blood thinners  Pt denies issues with constipation  No A fib or A flutter  Pt is  vaccinated  for Covid   Due to the COVID-19 pandemic we are asking patients to follow certain guidelines in PV and the [place]   Pt aware of COVID protocols and [facility] guidelines   PV completed over the phone. Pt verified name, DOB, address and insurance during PV today.  Pt mailed instruction packet with copy of consent form to read and not return, and instructions.  Pt encouraged to call with questions or issues.   Pt. Expressed concern about coverage of insurance for procedures,explained to  pt. That we send it over to get pre-cert.and they will send a letter,she requested that we call letting her know about coverage,explained to her that I was not sure that she would be able to do that but I will include it in the information along with number to reach her at,she verbalized understanding.

## 2021-02-22 ENCOUNTER — Ambulatory Visit: Payer: Medicare Other | Admitting: Dermatology

## 2021-02-24 DIAGNOSIS — M8589 Other specified disorders of bone density and structure, multiple sites: Secondary | ICD-10-CM | POA: Diagnosis not present

## 2021-02-24 DIAGNOSIS — Z780 Asymptomatic menopausal state: Secondary | ICD-10-CM | POA: Diagnosis not present

## 2021-03-02 HISTORY — PX: COLONOSCOPY: SHX174

## 2021-03-03 ENCOUNTER — Other Ambulatory Visit: Payer: Self-pay

## 2021-03-03 ENCOUNTER — Encounter: Payer: Self-pay | Admitting: Internal Medicine

## 2021-03-03 ENCOUNTER — Ambulatory Visit (AMBULATORY_SURGERY_CENTER): Payer: Medicare Other | Admitting: Internal Medicine

## 2021-03-03 VITALS — BP 127/81 | HR 76 | Temp 97.8°F | Resp 21 | Ht 65.25 in | Wt 130.0 lb

## 2021-03-03 DIAGNOSIS — Z86010 Personal history of colonic polyps: Secondary | ICD-10-CM

## 2021-03-03 DIAGNOSIS — Z85038 Personal history of other malignant neoplasm of large intestine: Secondary | ICD-10-CM | POA: Diagnosis not present

## 2021-03-03 DIAGNOSIS — K635 Polyp of colon: Secondary | ICD-10-CM | POA: Diagnosis not present

## 2021-03-03 DIAGNOSIS — D123 Benign neoplasm of transverse colon: Secondary | ICD-10-CM | POA: Diagnosis not present

## 2021-03-03 DIAGNOSIS — D124 Benign neoplasm of descending colon: Secondary | ICD-10-CM | POA: Diagnosis not present

## 2021-03-03 DIAGNOSIS — Z85048 Personal history of other malignant neoplasm of rectum, rectosigmoid junction, and anus: Secondary | ICD-10-CM

## 2021-03-03 MED ORDER — SODIUM CHLORIDE 0.9 % IV SOLN: 500.0000 mL | Freq: Once | INTRAVENOUS | Status: DC

## 2021-03-03 NOTE — Patient Instructions (Signed)
Handout on polyps given. ? ?Await pathology results. ? ?YOU HAD AN ENDOSCOPIC PROCEDURE TODAY AT THE [facility]:   Refer to the procedure report that was given to you for any specific questions about what was found during the examination.  If the procedure report does not answer your questions, please call your gastroenterologist to clarify.  If you requested that your care partner not be given the details of your procedure findings, then the procedure report has been included in a sealed envelope for you to review at your convenience later. ? ?YOU SHOULD EXPECT: Some feelings of bloating in the abdomen. Passage of more gas than usual.  Walking can help get rid of the air that was put into your GI tract during the procedure and reduce the bloating. If you had a lower endoscopy (such as a colonoscopy or flexible sigmoidoscopy) you may notice spotting of blood in your stool or on the toilet paper. If you underwent a bowel prep for your procedure, you may not have a normal bowel movement for a few days. ? ?Please Note:  You might notice some irritation and congestion in your nose or some drainage.  This is from the oxygen used during your procedure.  There is no need for concern and it should clear up in a day or so. ? ?SYMPTOMS TO REPORT IMMEDIATELY: ? ?Following lower endoscopy (colonoscopy or flexible sigmoidoscopy): ? Excessive amounts of blood in the stool ? Significant tenderness or worsening of abdominal pains ? Swelling of the abdomen that is new, acute ? Fever of 100?F or higher ?For urgent or emergent issues, a gastroenterologist can be reached at any hour by calling [phone]. ?Do not use MyChart messaging for urgent concerns.  ? ? ?DIET:  We do recommend a small meal at first, but then you may proceed to your regular diet.  Drink plenty of fluids but you should avoid alcoholic beverages for 24 hours. ? ?ACTIVITY:  You should plan to take it easy for the rest of today and you should NOT  DRIVE or use heavy machinery until tomorrow (because of the sedation medicines used during the test).   ? ?FOLLOW UP: ?Our staff will call the number listed on your records 48-72 hours following your procedure to check on you and address any questions or concerns that you may have regarding the information given to you following your procedure. If we do not reach you, we will leave a message.  We will attempt to reach you two times.  During this call, we will ask if you have developed any symptoms of COVID 19. If you develop any symptoms (ie: fever, flu-like symptoms, shortness of breath, cough etc.) before then, please call [phone].  If you test positive for Covid 19 in the 2 weeks post procedure, please call and report this information to us.   ? ?If any biopsies were taken you will be contacted by phone or by letter within the next 1-3 weeks.  Please call us at [phone] if you have not heard about the biopsies in 3 weeks.  ? ? ?SIGNATURES/CONFIDENTIALITY: ?You and/or your care partner have signed paperwork which will be entered into your electronic medical record.  These signatures attest to the fact that that the information above on your After Visit Summary has been reviewed and is understood.  Full responsibility of the confidentiality of this discharge information lies with you and/or your care-partner.  ?

## 2021-03-03 NOTE — Progress Notes (Signed)
Vital signs checked by:DT ? ?The patient states no changes in medical or surgical history since pre-visit screening on 02/16/21. ? ? ?

## 2021-03-03 NOTE — Progress Notes (Signed)
GASTROENTEROLOGY PROCEDURE H&P NOTE   Primary Care Physician: Velna Hatchet, MD    Reason for Procedure:  Personal history of colorectal cancer  Plan:    Surveillance colonoscopy  Patient is appropriate for endoscopic procedure(s) in the ambulatory ([facility]) setting.  The nature of the procedure, as well as the risks, benefits, and alternatives were carefully and thoroughly reviewed with the patient. Ample time for discussion and questions allowed. The patient understood, was satisfied, and agreed to proceed.     HPI: Natasha Campbell is a 71 y.o. female who presents for colonoscopy.  Medical history as below.  Tolerated the prep.  No recent chest pain or shortness of breath.  No abdominal pain today.  Past Medical History:  Diagnosis Date   Allergy    Arthritis    hands   Blood in stool    history   Blood transfusion without reported diagnosis 2012   with colon surgery   Chronic headaches    Chronic kidney disease 2014   20% kidney function failure per patient  acute episode per patient,    Family history of breast cancer    Family history of colon cancer    Family history of malignant neoplasm of gastrointestinal tract    Family history of prostate cancer    Headache(784.0)    migraines   Insomnia    Rectal cancer ([place]) 2018   rectal surgery   Rectal cancer, T1N0 s/p LAR 12/02/2010 11/01/2010   colon resection    Past Surgical History:  Procedure Laterality Date   COLON RESECTION  12/02/2010   Procedure: COLON RESECTION LAPAROSCOPIC;  Surgeon: Adin Hector, MD;  Location: [facility];  Service: General;  Laterality: N/A;  Laparoscopic Low Anterior Resection, Rigid Proctoscopy   COLON SURGERY  02/2016   rectal cancer   COLON SURGERY  2012   colon resection rectal ca   COLONOSCOPY     multiple d/t rectal cancer dx   PARTIAL PROCTECTOMY BY TEM N/A 02/29/2016   Procedure: PARTIAL PROCTECTOMY BY TEM OF RECTAL MASS ERAS PATHWAY;  Surgeon: Michael Boston, MD;  Location:  [facility];  Service: General;  Laterality: N/A;   POLYPECTOMY     TONSILLECTOMY AND ADENOIDECTOMY     age 17   WISDOM TOOTH EXTRACTION      Prior to Admission medications   Medication Sig Start Date End Date Taking? Authorizing Provider  acetaminophen (TYLENOL) 325 MG tablet Take 325 mg by mouth daily as needed (for migraine headache prevention.).    Yes [provider]  Aspirin-Acetaminophen-Caffeine (EXCEDRIN PO) Take 1 tablet by mouth daily as needed (for migraine headache prevention). HEADACHE   Yes [provider]  CALCIUM CITRATE PO Take 1,000 mg by mouth daily.   Yes [provider]  Cholecalciferol (VITAMIN D) 2000 UNITS tablet Take 2,000 Units by mouth daily.    Yes [provider]  clonazePAM (KLONOPIN) 1 MG tablet Take 0.25-0.5 mg by mouth at bedtime as needed (for sleep.).  10/28/15  Yes [provider]  TRIAMCINOLONE ACETONIDE, TOP, 0.05 % OINT triamcinolone acetonide 0.05 % topical ointment  APPLY A THIN LAYER TO THE AFFECTED AREA(S) BY TOPICAL ROUTE 2 TIMES PER DAY   Yes [provider]  Wheat Dextrin (BENEFIBER DRINK MIX PO) Take by mouth daily.   Yes [provider]  aspirin 325 MG tablet every 4 (four) hours as needed.  Patient not taking: Reported on 02/16/2021    [provider]  azelastine (ASTELIN) 0.1 %  nasal spray Place into both nostrils as needed. Use in each nostril as directed    [provider]  fluticasone (FLONASE) 50 MCG/ACT nasal spray Place 1 spray into both nostrils daily. Patient not taking: Reported on 02/16/2021    [provider]  FLUTICASONE PROPIONATE EX Apply topically. Uses topically twice a month Patient not taking: Reported on 02/16/2021    [provider]  Galcanezumab-gnlm 120 MG/ML SOAJ Inject into the skin.    [provider]  hydrocortisone cream 1 % Apply 1 application topically as needed for itching.    [provider]  ibuprofen  (ADVIL) 200 MG tablet Take 200 mg by mouth daily as needed (for migraine headache prevention.).  Patient not taking: Reported on 02/16/2021    [provider]  loratadine (CLARITIN) 10 MG tablet Take 10 mg by mouth daily. Patient not taking: Reported on 02/16/2021    [provider]  montelukast (SINGULAIR) 10 MG tablet  06/23/20   [provider]  Psyllium (METAMUCIL) 48.57 % POWD Take by mouth daily.    [provider]  rizatriptan (MAXALT) 10 MG tablet Take 10 mg by mouth daily as needed for migraine. Reported on 01/27/2015 Patient not taking: Reported on 02/16/2021    [provider]    Current Outpatient Medications  Medication Sig Dispense Refill   acetaminophen (TYLENOL) 325 MG tablet Take 325 mg by mouth daily as needed (for migraine headache prevention.).      Aspirin-Acetaminophen-Caffeine (EXCEDRIN PO) Take 1 tablet by mouth daily as needed (for migraine headache prevention). HEADACHE     CALCIUM CITRATE PO Take 1,000 mg by mouth daily.     Cholecalciferol (VITAMIN D) 2000 UNITS tablet Take 2,000 Units by mouth daily.      clonazePAM (KLONOPIN) 1 MG tablet Take 0.25-0.5 mg by mouth at bedtime as needed (for sleep.).   5   TRIAMCINOLONE ACETONIDE, TOP, 0.05 % OINT triamcinolone acetonide 0.05 % topical ointment  APPLY A THIN LAYER TO THE AFFECTED AREA(S) BY TOPICAL ROUTE 2 TIMES PER DAY     Wheat Dextrin (BENEFIBER DRINK MIX PO) Take by mouth daily.     aspirin 325 MG tablet every 4 (four) hours as needed.  (Patient not taking: Reported on 02/16/2021)     azelastine (ASTELIN) 0.1 % nasal spray Place into both nostrils as needed. Use in each nostril as directed     fluticasone (FLONASE) 50 MCG/ACT nasal spray Place 1 spray into both nostrils daily. (Patient not taking: Reported on 02/16/2021)     FLUTICASONE PROPIONATE EX Apply topically. Uses topically twice a month (Patient not taking: Reported on 02/16/2021)     Galcanezumab-gnlm 120 MG/ML SOAJ  Inject into the skin.     hydrocortisone cream 1 % Apply 1 application topically as needed for itching.     ibuprofen (ADVIL) 200 MG tablet Take 200 mg by mouth daily as needed (for migraine headache prevention.).  (Patient not taking: Reported on 02/16/2021)     loratadine (CLARITIN) 10 MG tablet Take 10 mg by mouth daily. (Patient not taking: Reported on 02/16/2021)     montelukast (SINGULAIR) 10 MG tablet  (Patient not taking: Reported on 02/16/2021)     Psyllium (METAMUCIL) 48.57 % POWD Take by mouth daily.     rizatriptan (MAXALT) 10 MG tablet Take 10 mg by mouth daily as needed for migraine. Reported on 01/27/2015 (Patient not taking: Reported on 02/16/2021)     Current Facility-Administered Medications  Medication Dose Route Frequency  Provider Last Rate Last Admin   0.9 %  sodium chloride infusion  500 mL Intravenous Once Raylee Adamec, Lajuan Lines, MD        Allergies as of 03/03/2021 - Review Complete 03/03/2021  Allergen Reaction Noted   Bee venom Swelling 02/18/2016   Wasp venom Swelling 02/18/2016    Family History  Problem Relation Age of Onset   Heart disease Mother    Heart disease Father        pacemaker   Skin cancer Father        basal cell   Breast cancer Sister    Colon cancer Maternal Grandmother 53   Colon polyps Maternal Grandmother    Colon cancer Paternal Grandmother 63   Colon cancer Paternal Grandfather 30   Colon polyps Paternal Grandfather    Cancer Cousin 10       sarcoma of the chest wall   Prostate cancer Cousin 50       metastatic   Breast cancer Other    Esophageal cancer Other    Stomach cancer Neg Hx    Rectal cancer Neg Hx     Social History   Socioeconomic History   Marital status: Married    Spouse name: Not on file   Number of children: 2   Years of education: Not on file   Highest education level: Not on file  Occupational History    Employer: [employer]  Tobacco Use   Smoking status: Never    Passive exposure: Never   Smokeless tobacco:  Never  Vaping Use   Vaping Use: Never used  Substance and Sexual Activity   Alcohol use: Yes    Alcohol/week: 0.0 standard drinks    Comment: seldom     Drug use: No   Sexual activity: Yes    Birth control/protection: Post-menopausal  Other Topics Concern   Not on file  Social History Narrative   Not on file   Social Determinants of Health   Financial Resource Strain: Not on file  Food Insecurity: Not on file  Transportation Needs: Not on file  Physical Activity: Not on file  Stress: Not on file  Social Connections: Not on file  Intimate Partner Violence: Not on file    Physical Exam: Vital signs in last 24 hours: @BP  125/72    Pulse 82    Temp 97.8 F (36.6 C) (Temporal)    Ht 5' 5.25" (1.657 m)    Wt 130 lb (59 kg)    SpO2 100%    BMI 21.47 kg/m  GEN: NAD EYE: Sclerae anicteric ENT: MMM CV: Non-tachycardic Pulm: CTA b/l GI: Soft, NT/ND NEURO:  Alert & Oriented x 3   Zenovia Jarred, MD [facility]  03/03/2021 10:46 AM

## 2021-03-03 NOTE — Progress Notes (Signed)
Called to room to assist during endoscopic procedure.  Patient ID and intended procedure confirmed with present staff. Received instructions for my participation in the procedure from the performing physician.  

## 2021-03-03 NOTE — Progress Notes (Signed)
PT taken to PACU. Monitors in place. VSS. Report given to RN. 

## 2021-03-03 NOTE — Op Note (Signed)
[place] ?Patient Name: Natasha Campbell ?Procedure Date: 03/03/2021 10:39 AM ?MRN: [id number] ?Endoscopist: Jerene Bears , MD ?Age: 71 ?Referring MD:  ?Date of Birth: March 06, 1950 ?Gender: Female ?Account #: [id number] ?Procedure:                Colonoscopy ?Indications:              High risk colon cancer surveillance: Personal  ?                          history of rectosigmoid colon cancer status post  ?                          resection with colocolonic anastomosis, subsequent  ?                          tubulovillous adenoma with a foci of ?                          adenocarcinoma in the distal rectum status post  ?                          transanal resection March 2018, personal history of  ?                          adenomatous and sessile-serrated polyps; Last  ?                          colonoscopy: September 2021 (normal) ?Medicines:                Monitored Anesthesia Care ?Procedure:                Pre-Anesthesia Assessment: ?                          - Prior to the procedure, a History and Physical  ?                          was performed, and patient medications and  ?                          allergies were reviewed. The patient's tolerance of  ?                          previous anesthesia was also reviewed. The risks  ?                          and benefits of the procedure and the sedation  ?                          options and risks were discussed with the patient.  ?                          All questions were answered, and informed consent  ?  was obtained. Prior Anticoagulants: The patient has  ?                          taken no previous anticoagulant or antiplatelet  ?                          agents. ASA Grade Assessment: II - A patient with  ?                          mild systemic disease. After reviewing the risks  ?                          and benefits, the patient was deemed in  ?                          satisfactory condition to undergo the  procedure. ?                          After obtaining informed consent, the colonoscope  ?                          was passed under direct vision. Throughout the  ?                          procedure, the patient's blood pressure, pulse, and  ?                          oxygen saturations were monitored continuously. The  ?                          Olympus PCF-H190DL (#[id number]) Colonoscope was  ?                          introduced through the anus and advanced to the  ?                          ileocolonic anastomosis. The colonoscopy was  ?                          performed without difficulty. The patient tolerated  ?                          the procedure well. The quality of the bowel  ?                          preparation was good. The ileocecal valve,  ?                          appendiceal orifice, and rectum were photographed. ?Scope In: 10:55:32 AM ?Scope Out: 11:11:57 AM ?Scope Withdrawal Time: 0 hours 13 minutes 25 seconds  ?Total Procedure Duration: 0 hours 16 minutes 25 seconds  ?Findings:                 The digital exam findings include anal scarring  ?  without significant stricture. ?                          A 6 mm polyp was found in the hepatic flexure. The  ?                          polyp was sessile and covered with mucus cap. The  ?                          polyp was removed with a cold snare. Resection and  ?                          retrieval were complete. ?                          A 1 mm polyp was found in the descending colon. The  ?                          polyp was sessile. The polyp was removed with a  ?                          cold biopsy forceps. Resection and retrieval were  ?                          complete. ?                          A 4 mm polyp was found in the descending colon. The  ?                          polyp was sessile. The polyp was removed with a  ?                          cold snare. Resection and retrieval were complete. ?                           There was evidence of a prior end-to-end  ?                          colo-colonic anastomosis in the distal rectum and  ?                          in the recto-sigmoid colon. This was patent and was  ?                          characterized by healthy appearing mucosa. No  ?                          evidence of adenomatous change. Examined with white  ?                          light and NBI. ?  Retroflexion in the rectum was not performed due to  ?                          post-surgical anatomy. ?Complications:            No immediate complications. ?Estimated Blood Loss:     Estimated blood loss was minimal. ?Impression:               - One 6 mm polyp at the hepatic flexure, removed  ?                          with a cold snare. Resected and retrieved. ?                          - One 1 mm polyp in the descending colon, removed  ?                          with a cold biopsy forceps. Resected and retrieved. ?                          - One 4 mm polyp in the descending colon, removed  ?                          with a cold snare. Resected and retrieved. ?                          - Patent end-to-end colo-colonic anastomosis,  ?                          characterized by healthy appearing mucosa. No  ?                          evidence of recurrent tumor. ?Recommendation:           - Patient has a contact number available for  ?                          emergencies. The signs and symptoms of potential  ?                          delayed complications were discussed with the  ?                          patient. Return to normal activities tomorrow.  ?                          Written discharge instructions were provided to the  ?                          patient. ?                          - Resume previous diet. ?                          - Continue present  medications. ?                          - Await pathology results. ?                          - Repeat colonoscopy is  recommended for  ?                          surveillance. The colonoscopy date will be  ?                          determined after pathology results from today's  ?                          exam become available for review. ?Jerene Bears, MD ?03/03/2021 11:25:55 AM ?This report has been signed electronically. ?

## 2021-03-07 ENCOUNTER — Telehealth: Payer: Self-pay

## 2021-03-07 NOTE — Telephone Encounter (Signed)
?  Follow up Call- ? ?Call back number 03/03/2021 09/30/2019 08/02/2018  ?Post procedure Call Back phone  # [phone]  ?Permission to leave phone message Yes Yes Yes  ?Some recent data might be hidden  ?  ? ?Patient questions: ? ?Do you have a fever, pain , or abdominal swelling? No. ?Pain Score  0 * ? ?Have you tolerated food without any problems? Yes.   ? ?Have you been able to return to your normal activities? Yes.   ? ?Do you have any questions about your discharge instructions: ?Diet   No. ?Medications  No. ?Follow up visit  No. ? ?Do you have questions or concerns about your Care? No. ? ?Actions: ?* If pain score is 4 or above: ?No action needed, pain <4. ? ?Pt reports her appetite is not what it was pre-procedure.  She denied n& v or pain.  She sais she was worried about the results of polyps since she has had colon cancer 2 x.  I advised pt to call the office next Friday to see if path results are back.  maw ? ? ?

## 2021-04-27 DIAGNOSIS — Z20822 Contact with and (suspected) exposure to covid-19: Secondary | ICD-10-CM | POA: Diagnosis not present

## 2021-05-16 DIAGNOSIS — H40013 Open angle with borderline findings, low risk, bilateral: Secondary | ICD-10-CM | POA: Diagnosis not present

## 2021-06-02 DIAGNOSIS — M5416 Radiculopathy, lumbar region: Secondary | ICD-10-CM | POA: Diagnosis not present

## 2021-06-02 DIAGNOSIS — M25552 Pain in left hip: Secondary | ICD-10-CM | POA: Diagnosis not present

## 2021-06-02 DIAGNOSIS — M25562 Pain in left knee: Secondary | ICD-10-CM | POA: Diagnosis not present

## 2021-07-11 DIAGNOSIS — N6323 Unspecified lump in the left breast, lower outer quadrant: Secondary | ICD-10-CM | POA: Diagnosis not present

## 2021-07-14 DIAGNOSIS — N6320 Unspecified lump in the left breast, unspecified quadrant: Secondary | ICD-10-CM | POA: Diagnosis not present

## 2021-07-14 DIAGNOSIS — R928 Other abnormal and inconclusive findings on diagnostic imaging of breast: Secondary | ICD-10-CM | POA: Diagnosis not present

## 2021-08-04 DIAGNOSIS — M5459 Other low back pain: Secondary | ICD-10-CM | POA: Diagnosis not present

## 2021-08-04 DIAGNOSIS — M545 Low back pain, unspecified: Secondary | ICD-10-CM | POA: Insufficient documentation

## 2021-08-04 DIAGNOSIS — M25552 Pain in left hip: Secondary | ICD-10-CM | POA: Diagnosis not present

## 2021-08-04 DIAGNOSIS — M4180 Other forms of scoliosis, site unspecified: Secondary | ICD-10-CM | POA: Diagnosis not present

## 2021-08-04 DIAGNOSIS — M25562 Pain in left knee: Secondary | ICD-10-CM | POA: Diagnosis not present

## 2021-08-13 DIAGNOSIS — M5459 Other low back pain: Secondary | ICD-10-CM | POA: Diagnosis not present

## 2021-08-17 DIAGNOSIS — M5459 Other low back pain: Secondary | ICD-10-CM | POA: Diagnosis not present

## 2021-08-17 DIAGNOSIS — M25552 Pain in left hip: Secondary | ICD-10-CM | POA: Diagnosis not present

## 2021-08-22 DIAGNOSIS — M5451 Vertebrogenic low back pain: Secondary | ICD-10-CM | POA: Diagnosis not present

## 2021-08-26 DIAGNOSIS — M5451 Vertebrogenic low back pain: Secondary | ICD-10-CM | POA: Diagnosis not present

## 2021-08-29 DIAGNOSIS — M5451 Vertebrogenic low back pain: Secondary | ICD-10-CM | POA: Diagnosis not present

## 2021-09-02 DIAGNOSIS — M5451 Vertebrogenic low back pain: Secondary | ICD-10-CM | POA: Diagnosis not present

## 2021-09-07 DIAGNOSIS — M5451 Vertebrogenic low back pain: Secondary | ICD-10-CM | POA: Diagnosis not present

## 2021-09-09 DIAGNOSIS — M5451 Vertebrogenic low back pain: Secondary | ICD-10-CM | POA: Diagnosis not present

## 2021-09-12 DIAGNOSIS — M5451 Vertebrogenic low back pain: Secondary | ICD-10-CM | POA: Diagnosis not present

## 2021-09-16 DIAGNOSIS — M5451 Vertebrogenic low back pain: Secondary | ICD-10-CM | POA: Diagnosis not present

## 2021-09-30 DIAGNOSIS — M5451 Vertebrogenic low back pain: Secondary | ICD-10-CM | POA: Diagnosis not present

## 2021-10-03 DIAGNOSIS — M5451 Vertebrogenic low back pain: Secondary | ICD-10-CM | POA: Diagnosis not present

## 2021-10-06 DIAGNOSIS — M5451 Vertebrogenic low back pain: Secondary | ICD-10-CM | POA: Diagnosis not present

## 2021-10-12 DIAGNOSIS — M5451 Vertebrogenic low back pain: Secondary | ICD-10-CM | POA: Diagnosis not present

## 2021-10-22 DIAGNOSIS — Z23 Encounter for immunization: Secondary | ICD-10-CM | POA: Diagnosis not present

## 2021-11-03 DIAGNOSIS — F419 Anxiety disorder, unspecified: Secondary | ICD-10-CM | POA: Diagnosis not present

## 2021-11-03 DIAGNOSIS — E785 Hyperlipidemia, unspecified: Secondary | ICD-10-CM | POA: Diagnosis not present

## 2021-11-03 DIAGNOSIS — C179 Malignant neoplasm of small intestine, unspecified: Secondary | ICD-10-CM | POA: Diagnosis not present

## 2021-11-03 DIAGNOSIS — R7989 Other specified abnormal findings of blood chemistry: Secondary | ICD-10-CM | POA: Diagnosis not present

## 2021-11-03 DIAGNOSIS — M8580 Other specified disorders of bone density and structure, unspecified site: Secondary | ICD-10-CM | POA: Diagnosis not present

## 2021-11-03 DIAGNOSIS — D638 Anemia in other chronic diseases classified elsewhere: Secondary | ICD-10-CM | POA: Diagnosis not present

## 2021-11-03 DIAGNOSIS — N2581 Secondary hyperparathyroidism of renal origin: Secondary | ICD-10-CM | POA: Diagnosis not present

## 2021-11-07 DIAGNOSIS — Z1339 Encounter for screening examination for other mental health and behavioral disorders: Secondary | ICD-10-CM | POA: Diagnosis not present

## 2021-11-07 DIAGNOSIS — M8580 Other specified disorders of bone density and structure, unspecified site: Secondary | ICD-10-CM | POA: Diagnosis not present

## 2021-11-07 DIAGNOSIS — F419 Anxiety disorder, unspecified: Secondary | ICD-10-CM | POA: Diagnosis not present

## 2021-11-07 DIAGNOSIS — G43909 Migraine, unspecified, not intractable, without status migrainosus: Secondary | ICD-10-CM | POA: Diagnosis not present

## 2021-11-07 DIAGNOSIS — N2581 Secondary hyperparathyroidism of renal origin: Secondary | ICD-10-CM | POA: Diagnosis not present

## 2021-11-07 DIAGNOSIS — G4700 Insomnia, unspecified: Secondary | ICD-10-CM | POA: Diagnosis not present

## 2021-11-07 DIAGNOSIS — E785 Hyperlipidemia, unspecified: Secondary | ICD-10-CM | POA: Diagnosis not present

## 2021-11-07 DIAGNOSIS — N1831 Chronic kidney disease, stage 3a: Secondary | ICD-10-CM | POA: Diagnosis not present

## 2021-11-07 DIAGNOSIS — Z1331 Encounter for screening for depression: Secondary | ICD-10-CM | POA: Diagnosis not present

## 2021-11-07 DIAGNOSIS — N390 Urinary tract infection, site not specified: Secondary | ICD-10-CM | POA: Diagnosis not present

## 2021-11-07 DIAGNOSIS — Z0000 Encounter for general adult medical examination without abnormal findings: Secondary | ICD-10-CM | POA: Diagnosis not present

## 2021-11-21 ENCOUNTER — Ambulatory Visit: Payer: Medicare Other | Admitting: Dermatology

## 2021-11-30 ENCOUNTER — Ambulatory Visit: Payer: Medicare Other | Admitting: Dermatology

## 2021-12-01 DIAGNOSIS — Z6831 Body mass index (BMI) 31.0-31.9, adult: Secondary | ICD-10-CM | POA: Diagnosis not present

## 2021-12-01 DIAGNOSIS — Z1151 Encounter for screening for human papillomavirus (HPV): Secondary | ICD-10-CM | POA: Diagnosis not present

## 2021-12-01 DIAGNOSIS — Z124 Encounter for screening for malignant neoplasm of cervix: Secondary | ICD-10-CM | POA: Diagnosis not present

## 2022-04-05 ENCOUNTER — Ambulatory Visit: Payer: Medicare Other | Admitting: Podiatry

## 2022-04-10 ENCOUNTER — Encounter: Payer: Self-pay | Admitting: Podiatry

## 2022-04-10 ENCOUNTER — Ambulatory Visit (INDEPENDENT_AMBULATORY_CARE_PROVIDER_SITE_OTHER): Payer: Medicare Other | Admitting: Podiatry

## 2022-04-10 ENCOUNTER — Ambulatory Visit (INDEPENDENT_AMBULATORY_CARE_PROVIDER_SITE_OTHER): Payer: Medicare Other

## 2022-04-10 DIAGNOSIS — M7671 Peroneal tendinitis, right leg: Secondary | ICD-10-CM | POA: Diagnosis not present

## 2022-04-10 DIAGNOSIS — M778 Other enthesopathies, not elsewhere classified: Secondary | ICD-10-CM | POA: Diagnosis not present

## 2022-04-10 MED ORDER — TRIAMCINOLONE ACETONIDE 10 MG/ML IJ SUSP
10.0000 mg | Freq: Once | INTRAMUSCULAR | Status: AC
Start: 2022-04-10 — End: ?

## 2022-04-10 MED ORDER — TRIAMCINOLONE ACETONIDE 10 MG/ML IJ SUSP
10.0000 mg | Freq: Once | INTRAMUSCULAR | Status: AC
Start: 2022-04-10 — End: 2022-04-10
  Administered 2022-04-10: 10 mg

## 2022-04-11 NOTE — Progress Notes (Signed)
Subjective:   Patient ID: Natasha Campbell, female   DOB: 72 y.o.   MRN: [id number]   HPI Patient presents stating she is developed a lot of pain on the outside of her right foot and it has been going on for around a month does not remember injury   ROS      Objective:  Physical Exam  Neurovascular status intact with inflammation in the lateral side right foot base of fifth metatarsal around the insertion of the peroneal tendon with no indications of current peroneal damage     Assessment:  Peroneal tendinitis right inflammatory at insertion     Plan:  H&P x-ray reviewed sterile prep injected the sheath of the tendon near insertion 3 mg Dexasone Kenalog 5 mg Xylocaine advised on ice therapy and I first explained risk of injection.  I advised on a period of reduced activity patient will be seen back to recheck and dispensed fascial brace that was fitted properly with instructions on bringing the lateral side of the foot up to take stress off the peroneal tendon  X-rays were negative for signs of bone structural issues or other pathology from that perspective

## 2022-04-14 DIAGNOSIS — M79671 Pain in right foot: Secondary | ICD-10-CM | POA: Diagnosis not present

## 2022-05-04 DIAGNOSIS — M79671 Pain in right foot: Secondary | ICD-10-CM | POA: Diagnosis not present

## 2022-05-12 DIAGNOSIS — E785 Hyperlipidemia, unspecified: Secondary | ICD-10-CM | POA: Diagnosis not present

## 2022-05-12 DIAGNOSIS — M791 Myalgia, unspecified site: Secondary | ICD-10-CM | POA: Diagnosis not present

## 2022-05-12 DIAGNOSIS — L4052 Psoriatic arthritis mutilans: Secondary | ICD-10-CM | POA: Diagnosis not present

## 2022-05-24 DIAGNOSIS — H40013 Open angle with borderline findings, low risk, bilateral: Secondary | ICD-10-CM | POA: Diagnosis not present

## 2022-05-25 ENCOUNTER — Encounter: Payer: Self-pay | Admitting: Internal Medicine

## 2022-07-05 DIAGNOSIS — Z1283 Encounter for screening for malignant neoplasm of skin: Secondary | ICD-10-CM | POA: Diagnosis not present

## 2022-07-05 DIAGNOSIS — D225 Melanocytic nevi of trunk: Secondary | ICD-10-CM | POA: Diagnosis not present

## 2022-07-05 DIAGNOSIS — L820 Inflamed seborrheic keratosis: Secondary | ICD-10-CM | POA: Diagnosis not present

## 2022-07-20 DIAGNOSIS — Z1231 Encounter for screening mammogram for malignant neoplasm of breast: Secondary | ICD-10-CM | POA: Diagnosis not present

## 2022-07-26 DIAGNOSIS — N6002 Solitary cyst of left breast: Secondary | ICD-10-CM | POA: Diagnosis not present

## 2022-07-26 DIAGNOSIS — N63 Unspecified lump in unspecified breast: Secondary | ICD-10-CM | POA: Diagnosis not present

## 2022-07-26 DIAGNOSIS — R922 Inconclusive mammogram: Secondary | ICD-10-CM | POA: Diagnosis not present

## 2022-08-04 DIAGNOSIS — M8580 Other specified disorders of bone density and structure, unspecified site: Secondary | ICD-10-CM | POA: Insufficient documentation

## 2022-08-04 DIAGNOSIS — G43909 Migraine, unspecified, not intractable, without status migrainosus: Secondary | ICD-10-CM | POA: Insufficient documentation

## 2022-08-04 DIAGNOSIS — Z85048 Personal history of other malignant neoplasm of rectum, rectosigmoid junction, and anus: Secondary | ICD-10-CM | POA: Insufficient documentation

## 2022-08-16 ENCOUNTER — Ambulatory Visit (AMBULATORY_SURGERY_CENTER): Payer: Medicare Other

## 2022-08-16 VITALS — Ht 66.0 in | Wt 125.0 lb

## 2022-08-16 DIAGNOSIS — Z86010 Personal history of colonic polyps: Secondary | ICD-10-CM

## 2022-08-16 DIAGNOSIS — Z85038 Personal history of other malignant neoplasm of large intestine: Secondary | ICD-10-CM

## 2022-08-16 MED ORDER — NA SULFATE-K SULFATE-MG SULF 17.5-3.13-1.6 GM/177ML PO SOLN: 1.0000 | Freq: Once | ORAL | 0 refills | Status: AC

## 2022-08-16 NOTE — Progress Notes (Signed)
Pre visit completed via phone call; Patient verified name, DOB, and address;  No egg or soy allergy known to patient;  No issues known to pt with past sedation with any surgeries or procedures; Patient denies ever being told they had issues or difficulty with intubation;  No FH of Malignant Hyperthermia; Pt is not on diet pills; Pt is not on home 02;  Pt is not on blood thinners; Pt denies issues with constipation- patient reports she takes Metamucil and Benefiber daily to assist with constipation; patient advised to increase oral fluids, activity, and fruits and veggies as allowed prrio to her prep;  patient also advised she can take Miralax or Dulcolax if need as well; No A fib or A flutter;  Have any cardiac testing pending--NO Insurance verified during PV appt--- Medicare  Pt can ambulate without assistance;  Pt denies use of chewing tobacco Discussed diabetic/weight loss medication holds; Discussed NSAID holds; Checked BMI to be less than 50; Pt instructed to use [employer] or GoodRx for a price reduction on prep    Pre visit completed and red dot placed by patient's name on their procedure day (on provider's schedule).    Instructions sent to patient via mail after being printed;

## 2022-08-24 ENCOUNTER — Telehealth: Payer: Self-pay | Admitting: Internal Medicine

## 2022-08-24 NOTE — Telephone Encounter (Signed)
Inbound call from patient requesting to speak about coding for 9/5 colonoscopy. States that insurance will not cover if it has "screening" in the coding. Patient requesting a call back to discuss further. Please advise, thank you.

## 2022-09-06 ENCOUNTER — Telehealth: Payer: Self-pay | Admitting: Internal Medicine

## 2022-09-06 NOTE — Telephone Encounter (Signed)
PT would like to discuss instructions about what she can eat during the clear liquid phase. Please advise.

## 2022-09-06 NOTE — Telephone Encounter (Signed)
Pt asked if she could drink Pineapple juice if it had no pulp in it. RN instructed pt that the flulids need to be clear only and off the list. Pt states she understood.

## 2022-09-07 ENCOUNTER — Ambulatory Visit: Payer: Medicare Other | Admitting: Internal Medicine

## 2022-09-07 ENCOUNTER — Encounter: Payer: Self-pay | Admitting: Internal Medicine

## 2022-09-07 VITALS — BP 136/67 | HR 69 | Temp 98.6°F | Resp 10 | Ht 66.0 in | Wt 125.0 lb

## 2022-09-07 DIAGNOSIS — Z08 Encounter for follow-up examination after completed treatment for malignant neoplasm: Secondary | ICD-10-CM | POA: Diagnosis not present

## 2022-09-07 DIAGNOSIS — Z85038 Personal history of other malignant neoplasm of large intestine: Secondary | ICD-10-CM | POA: Diagnosis not present

## 2022-09-07 DIAGNOSIS — Z86010 Personal history of colonic polyps: Secondary | ICD-10-CM | POA: Diagnosis not present

## 2022-09-07 MED ORDER — SODIUM CHLORIDE 0.9 % IV SOLN: 500.0000 mL | INTRAVENOUS | Status: AC

## 2022-09-07 NOTE — Patient Instructions (Signed)

## 2022-09-07 NOTE — Progress Notes (Signed)
GASTROENTEROLOGY PROCEDURE H&P NOTE   Primary Care Physician: Alysia Penna, MD    Reason for Procedure:  History of colon cancer and colon polyps  Plan:    Colonoscopy  Patient is appropriate for endoscopic procedure(s) in the ambulatory ([facility]) setting.  The nature of the procedure, as well as the risks, benefits, and alternatives were carefully and thoroughly reviewed with the patient. Ample time for discussion and questions allowed. The patient understood, was satisfied, and agreed to proceed.     HPI: Natasha Campbell is a 72 y.o. female who presents for surveillance colonoscopy.  Medical history as below.  Tolerated the prep.  No recent chest pain or shortness of breath.  No abdominal pain today.  Past Medical History:  Diagnosis Date   Allergy    Arthritis    generalized   Blood in stool    history   Blood transfusion without reported diagnosis 2012   with colon surgery   Chronic headaches    Chronic kidney disease 2014   hx of-20% kidney function failure per patient  acute episode per patient,   Family history of breast cancer    Family history of colon cancer    Family history of malignant neoplasm of gastrointestinal tract    Family history of prostate cancer    Headache(784.0)    migraines   Insomnia    Rectal cancer (HCC) 2018   rectal surgery   Rectal cancer, T1N0 s/p LAR 12/02/2010 11/01/2010   colon resection    Past Surgical History:  Procedure Laterality Date   COLON RESECTION  12/02/2010   Procedure: COLON RESECTION LAPAROSCOPIC;  Surgeon: Ardeth Sportsman, MD;  Location: [facility];  Service: General;  Laterality: N/A;  Laparoscopic Low Anterior Resection, Rigid Proctoscopy   COLON SURGERY  02/2016   rectal cancer   COLON SURGERY  2012   colon resection rectal ca   COLONOSCOPY  03/2021   JMP-MAC-suprep(good)-SSp/patent anastamosis   PARTIAL PROCTECTOMY BY TEM N/A 02/29/2016   Procedure: PARTIAL PROCTECTOMY BY TEM OF RECTAL MASS ERAS PATHWAY;   Surgeon: Karie Soda, MD;  Location: [facility];  Service: General;  Laterality: N/A;   POLYPECTOMY     TONSILLECTOMY AND ADENOIDECTOMY  1973   WISDOM TOOTH EXTRACTION      Prior to Admission medications   Medication Sig Start Date End Date Taking? Authorizing Provider  aspirin EC 81 MG tablet Take 81 mg by mouth daily.   Yes [provider]  CALCIUM CITRATE PO Take 1,000 mg by mouth daily.   Yes [provider]  CALCIUM-VITAMIN D PO Take 1 tablet by mouth daily at 6 (six) AM.   Yes [provider]  Cholecalciferol (VITAMIN D) 2000 UNITS tablet Take 2,000 Units by mouth daily.    Yes [provider]  clonazePAM (KLONOPIN) 1 MG tablet Take 0.25-0.5 mg by mouth at bedtime as needed (for sleep.).  10/28/15  Yes [provider]  Coenzyme Q10 (COQ-10 PO) Take 1 tablet by mouth daily.   Yes [provider]  Galcanezumab-gnlm 120 MG/ML SOAJ Inject into the skin.   Yes [provider]  Multiple Vitamins-Minerals (WOMENS 50+ MULTI VITAMIN) TABS Take 1 tablet by mouth daily at 6 (six) AM.   Yes [provider]  acetaminophen (TYLENOL) 325 MG tablet Take 325 mg by mouth daily.    [provider]  Aspirin-Acetaminophen-Caffeine (EXCEDRIN PO) Take 1 tablet by mouth daily as needed (for migraine headache prevention). HEADACHE    [provider]  azelastine (ASTELIN) 0.1 % nasal spray Place into both nostrils as needed. Use in each nostril as directed Patient not taking: Reported on 08/16/2022    [provider]  fluticasone (FLONASE) 50 MCG/ACT nasal spray Place 1 spray into both nostrils daily as needed.    [provider]  FLUTICASONE PROPIONATE EX Apply topically. Uses topically twice a month    [provider]  ibuprofen (ADVIL) 200 MG tablet Take 200 mg by mouth daily as needed (for migraine headache prevention.).    [provider]  levocetirizine (XYZAL) 5 MG tablet Take 5 mg by  mouth every evening.    [provider]  Psyllium (METAMUCIL) 48.57 % POWD Take by mouth daily.    [provider]  rizatriptan (MAXALT) 10 MG tablet Take 10 mg by mouth daily as needed for migraine. Reported on 01/27/2015    [provider]  Wheat Dextrin (BENEFIBER DRINK MIX PO) Take by mouth daily.    [provider]    Current Outpatient Medications  Medication Sig Dispense Refill   aspirin EC 81 MG tablet Take 81 mg by mouth daily.     CALCIUM CITRATE PO Take 1,000 mg by mouth daily.     CALCIUM-VITAMIN D PO Take 1 tablet by mouth daily at 6 (six) AM.     Cholecalciferol (VITAMIN D) 2000 UNITS tablet Take 2,000 Units by mouth daily.      clonazePAM (KLONOPIN) 1 MG tablet Take 0.25-0.5 mg by mouth at bedtime as needed (for sleep.).   5   Coenzyme Q10 (COQ-10 PO) Take 1 tablet by mouth daily.     Galcanezumab-gnlm 120 MG/ML SOAJ Inject into the skin.     Multiple Vitamins-Minerals (WOMENS 50+ MULTI VITAMIN) TABS Take 1 tablet by mouth daily at 6 (six) AM.     acetaminophen (TYLENOL) 325 MG tablet Take 325 mg by mouth daily.     Aspirin-Acetaminophen-Caffeine (EXCEDRIN PO) Take 1 tablet by mouth daily as needed (for migraine headache prevention). HEADACHE     azelastine (ASTELIN) 0.1 % nasal spray Place into both nostrils as needed. Use in each nostril as directed (Patient not taking: Reported on 08/16/2022)     fluticasone (FLONASE) 50 MCG/ACT nasal spray Place 1 spray into both nostrils daily as needed.     FLUTICASONE PROPIONATE EX Apply topically. Uses topically twice a month     ibuprofen (ADVIL) 200 MG tablet Take 200 mg by mouth daily as needed (for migraine headache prevention.).     levocetirizine (XYZAL) 5 MG tablet Take 5 mg by mouth every evening.     Psyllium (METAMUCIL) 48.57 % POWD Take by mouth daily.     rizatriptan (MAXALT) 10 MG tablet Take 10 mg by mouth daily as needed for migraine. Reported on 01/27/2015     Wheat Dextrin (BENEFIBER  DRINK MIX PO) Take by mouth daily.     Current Facility-Administered Medications  Medication Dose Route Frequency Provider Last Rate Last Admin   0.9 %  sodium chloride infusion  500 mL Intravenous Continuous Costas Sena, Carie Caddy, MD       triamcinolone acetonide (KENALOG) 10 MG/ML injection 10 mg  10 mg Other Once Lenn Sink, DPM        Allergies as of 09/07/2022 - Review Complete 09/07/2022  Allergen Reaction Noted   Bee venom Swelling 02/18/2016   Wasp venom Swelling 02/18/2016    Family History  Problem Relation Age of Onset   Heart disease Mother  Heart disease Father        pacemaker   Skin cancer Father        basal cell   Colon polyps Sister 21   Breast cancer Sister    Colon cancer Maternal Grandmother 46   Colon polyps Maternal Grandmother    Colon polyps Paternal Grandmother    Colon cancer Paternal Grandmother 52   Colon cancer Paternal Grandfather 58   Colon polyps Paternal Grandfather    Cancer Cousin 58       sarcoma of the chest wall   Prostate cancer Cousin 50       metastatic   Breast cancer Other    Stomach cancer Neg Hx    Rectal cancer Neg Hx    Esophageal cancer Neg Hx     Social History   Socioeconomic History   Marital status: Married    Spouse name: Not on file   Number of children: 2   Years of education: Not on file   Highest education level: Not on file  Occupational History    Employer: [employer]  Tobacco Use   Smoking status: Never    Passive exposure: Never   Smokeless tobacco: Never  Vaping Use   Vaping status: Never Used  Substance and Sexual Activity   Alcohol use: Yes    Alcohol/week: 0.0 standard drinks of alcohol    Comment: seldom     Drug use: No   Sexual activity: Yes    Birth control/protection: Post-menopausal  Other Topics Concern   Not on file  Social History Narrative   Not on file   Social Determinants of Health   Financial Resource Strain: Not on file  Food Insecurity: Not on file  Transportation  Needs: Not on file  Physical Activity: Not on file  Stress: Not on file  Social Connections: Unknown (05/14/2021)   Received from [facility]   Social Network    Social Network: Not on file  Intimate Partner Violence: Unknown (04/06/2021)   Received from [facility]   HITS    Physically Hurt: Not on file    Insult or Talk Down To: Not on file    Threaten Physical Harm: Not on file    Scream or Curse: Not on file    Physical Exam: Vital signs in last 24 hours: @BP  123/76   Pulse 74   Temp 98.6 F (37 C)   Ht 5\' 6"  (1.676 m)   Wt 125 lb (56.7 kg)   SpO2 100%   BMI 20.18 kg/m  GEN: NAD EYE: Sclerae anicteric ENT: MMM CV: Non-tachycardic Pulm: CTA b/l GI: Soft, NT/ND NEURO:  Alert & Oriented x 3   Erick Blinks, MD [facility]  09/07/2022 11:31 AM

## 2022-09-07 NOTE — Op Note (Signed)
[facility] Patient Name: Natasha Campbell Procedure Date: 09/07/2022 11:30 AM MRN: [id number] Endoscopist: Beverley Fiedler , MD, [id number] Age: 72 Referring MD:  Date of Birth: Oct 16, 1950 Gender: Female Account #: [id number] Procedure:                Colonoscopy Indications:              High risk colon cancer surveillance: Personal                            history of colon cancer, Last colonoscopy: March                            2023; (History of rectosigmoid cancer status post                            resection, subsequent TVA with foci of                            adenocarcinoma in the distal rectum requiring                            transanal excision in March 2018); history of other                            adenomatous and sessile serrated polyps Procedure:                Pre-Anesthesia Assessment:                           - Prior to the procedure, a History and Physical                            was performed, and patient medications and                            allergies were reviewed. The patient's tolerance of                            previous anesthesia was also reviewed. The risks                            and benefits of the procedure and the sedation                            options and risks were discussed with the patient.                            All questions were answered, and informed consent                            was obtained. Prior Anticoagulants: The patient has                            taken no anticoagulant or antiplatelet agents. ASA  Grade Assessment: II - A patient with mild systemic                            disease. After reviewing the risks and benefits,                            the patient was deemed in satisfactory condition to                            undergo the procedure.                           After obtaining informed consent, the colonoscope                            was passed  under direct vision. Throughout the                            procedure, the patient's blood pressure, pulse, and                            oxygen saturations were monitored continuously. The                            Olympus PCF-H190DL (#[id number]) Colonoscope was                            introduced through the anus and advanced to the                            cecum, identified by appendiceal orifice and                            ileocecal valve. The colonoscopy was performed with                            ease. The patient tolerated the procedure well. The                            quality of the bowel preparation was excellent. The                            ileocecal valve, appendiceal orifice, and rectum                            were photographed. Scope In: 11:49:06 AM Scope Out: 12:00:38 PM Scope Withdrawal Time: 0 hours 9 minutes 37 seconds  Total Procedure Duration: 0 hours 11 minutes 32 seconds  Findings:                 An anal fissure was found on perianal exam.                           There was evidence of a prior end-to-end  colo-colonic anastomosis in the recto-sigmoid                            colon. This was patent and was characterized by                            healthy appearing mucosa.                           The entire examined colon appeared normal. Complications:            No immediate complications. Estimated Blood Loss:     Estimated blood loss: none. Impression:               - Anal fissure found on perianal exam.                           - Patent end-to-end colo-colonic anastomosis,                            characterized by healthy appearing mucosa.                           - The entire examined colon is normal.                           - No specimens collected. Recommendation:           - Patient has a contact number available for                            emergencies. The signs and symptoms of potential                             delayed complications were discussed with the                            patient. Return to normal activities tomorrow.                            Written discharge instructions were provided to the                            patient.                           - Resume previous diet.                           - Continue present medications.                           - Repeat colonoscopy in 2 years for surveillance. Beverley Fiedler, MD 09/07/2022 12:08:32 PM This report has been signed electronically.

## 2022-09-07 NOTE — Progress Notes (Signed)
Pt's states no medical or surgical changes since previsit or office visit. 

## 2022-09-07 NOTE — Progress Notes (Signed)
Uneventful anesthetic. Report to pacu rn. Vss. Care resumed by rn. 

## 2022-09-08 ENCOUNTER — Telehealth: Payer: Self-pay

## 2022-09-08 NOTE — Telephone Encounter (Signed)
  Follow up Call-     09/07/2022   10:34 AM 03/03/2021    9:51 AM  Call back number  Post procedure Call Back phone  # [phone]  Permission to leave phone message Yes Yes     Patient questions:  Do you have a fever, pain , or abdominal swelling? No. Pain Score  0 *  Have you tolerated food without any problems? Yes.    Have you been able to return to your normal activities? Yes.    Do you have any questions about your discharge instructions: Diet   No. Medications  No. Follow up visit  No.  Do you have questions or concerns about your Care? No.  Actions: * If pain score is 4 or above: No action needed, pain <4.

## 2022-10-04 DIAGNOSIS — Z23 Encounter for immunization: Secondary | ICD-10-CM | POA: Diagnosis not present

## 2022-11-20 DIAGNOSIS — D638 Anemia in other chronic diseases classified elsewhere: Secondary | ICD-10-CM | POA: Diagnosis not present

## 2022-11-20 DIAGNOSIS — N2581 Secondary hyperparathyroidism of renal origin: Secondary | ICD-10-CM | POA: Diagnosis not present

## 2022-11-20 DIAGNOSIS — E785 Hyperlipidemia, unspecified: Secondary | ICD-10-CM | POA: Diagnosis not present

## 2022-11-20 DIAGNOSIS — M8580 Other specified disorders of bone density and structure, unspecified site: Secondary | ICD-10-CM | POA: Diagnosis not present

## 2022-11-20 DIAGNOSIS — N1831 Chronic kidney disease, stage 3a: Secondary | ICD-10-CM | POA: Diagnosis not present

## 2022-11-20 DIAGNOSIS — Z79899 Other long term (current) drug therapy: Secondary | ICD-10-CM | POA: Diagnosis not present

## 2022-11-27 DIAGNOSIS — Z1331 Encounter for screening for depression: Secondary | ICD-10-CM | POA: Diagnosis not present

## 2022-11-27 DIAGNOSIS — M79671 Pain in right foot: Secondary | ICD-10-CM | POA: Diagnosis not present

## 2022-11-27 DIAGNOSIS — N2581 Secondary hyperparathyroidism of renal origin: Secondary | ICD-10-CM | POA: Diagnosis not present

## 2022-11-27 DIAGNOSIS — N1831 Chronic kidney disease, stage 3a: Secondary | ICD-10-CM | POA: Diagnosis not present

## 2022-11-27 DIAGNOSIS — M8580 Other specified disorders of bone density and structure, unspecified site: Secondary | ICD-10-CM | POA: Diagnosis not present

## 2022-11-27 DIAGNOSIS — E785 Hyperlipidemia, unspecified: Secondary | ICD-10-CM | POA: Diagnosis not present

## 2022-11-27 DIAGNOSIS — Z23 Encounter for immunization: Secondary | ICD-10-CM | POA: Diagnosis not present

## 2022-11-27 DIAGNOSIS — F419 Anxiety disorder, unspecified: Secondary | ICD-10-CM | POA: Diagnosis not present

## 2022-11-27 DIAGNOSIS — Z1339 Encounter for screening examination for other mental health and behavioral disorders: Secondary | ICD-10-CM | POA: Diagnosis not present

## 2022-11-27 DIAGNOSIS — M1990 Unspecified osteoarthritis, unspecified site: Secondary | ICD-10-CM | POA: Diagnosis not present

## 2022-11-27 DIAGNOSIS — G4700 Insomnia, unspecified: Secondary | ICD-10-CM | POA: Diagnosis not present

## 2022-11-27 DIAGNOSIS — N183 Chronic kidney disease, stage 3 unspecified: Secondary | ICD-10-CM | POA: Diagnosis not present

## 2022-11-27 DIAGNOSIS — Z0000 Encounter for general adult medical examination without abnormal findings: Secondary | ICD-10-CM | POA: Diagnosis not present

## 2023-04-09 ENCOUNTER — Encounter: Payer: Self-pay | Admitting: Student-PharmD

## 2023-04-09 NOTE — Progress Notes (Signed)
 Patient has been approved to receive Em[facility] from [employer] in 2025. Medication will be shipped to patient's home.  Jeanella Craze, PharmD Clinical Pharmacist [phone]

## 2023-06-05 DIAGNOSIS — H40013 Open angle with borderline findings, low risk, bilateral: Secondary | ICD-10-CM | POA: Diagnosis not present

## 2023-06-14 DIAGNOSIS — D2112 Benign neoplasm of connective and other soft tissue of left upper limb, including shoulder: Secondary | ICD-10-CM | POA: Diagnosis not present

## 2023-06-14 DIAGNOSIS — L720 Epidermal cyst: Secondary | ICD-10-CM | POA: Diagnosis not present

## 2023-06-14 DIAGNOSIS — D3612 Benign neoplasm of peripheral nerves and autonomic nervous system, upper limb, including shoulder: Secondary | ICD-10-CM | POA: Diagnosis not present

## 2023-06-14 DIAGNOSIS — D225 Melanocytic nevi of trunk: Secondary | ICD-10-CM | POA: Diagnosis not present

## 2023-06-14 DIAGNOSIS — Z1283 Encounter for screening for malignant neoplasm of skin: Secondary | ICD-10-CM | POA: Diagnosis not present

## 2023-06-14 DIAGNOSIS — D3614 Benign neoplasm of peripheral nerves and autonomic nervous system of thorax: Secondary | ICD-10-CM | POA: Diagnosis not present

## 2023-06-14 DIAGNOSIS — D216 Benign neoplasm of connective and other soft tissue of trunk, unspecified: Secondary | ICD-10-CM | POA: Diagnosis not present

## 2023-08-28 DIAGNOSIS — M810 Age-related osteoporosis without current pathological fracture: Secondary | ICD-10-CM | POA: Diagnosis not present

## 2023-08-28 DIAGNOSIS — Z1231 Encounter for screening mammogram for malignant neoplasm of breast: Secondary | ICD-10-CM | POA: Diagnosis not present

## 2023-08-28 LAB — HM DEXA SCAN

## 2023-08-29 DIAGNOSIS — M19071 Primary osteoarthritis, right ankle and foot: Secondary | ICD-10-CM | POA: Diagnosis not present

## 2023-08-29 DIAGNOSIS — M205X1 Other deformities of toe(s) (acquired), right foot: Secondary | ICD-10-CM | POA: Diagnosis not present

## 2023-09-20 ENCOUNTER — Ambulatory Visit: Admitting: Family Medicine

## 2023-09-24 NOTE — Progress Notes (Unsigned)
   IIleana Collet, PhD, LAT, ATC acting as a scribe for Artist Lloyd, MD.  Natasha Campbell is a 73 y.o. female who presents to [facility] at [facility] today for osteoporosis management  DEXA scan (date, T-score): 08/28/23: L-radius= -1.6, R-FN= -2.6, L-FN= -1.9 Prior treatment: none History of Hip, Spine, or Wrist Fx: no Heart disease or stroke: no Cancer: yes- colon cancer x2, no chemo, no radiation Kidney Disease: no Gastric/Peptic Ulcer: no Gastric bypass surgery: no Severe GERD: no Hx of seizures: no Age at Menopause: 73y/o Calcium intake: yes- 1000mg  Vitamin D intake: yes- 2000iU Hormone replacement therapy: no Smoking history: never smoked Alcohol : none Exercise: yes- walking x 5days/wk Major dental work in past year: no Parents with hip/spine fracture: yes- mom wrist fx Height loss: yes 5'6.75 to 5' 5   Pertinent review of systems: No fevers or chills  Relevant historical information: History of colon and rectal cancer status post resection.  Patient did not require chemotherapy.   Exam:  BP 120/74   Pulse 83   Ht 5' 6 (1.676 m)   Wt 131 lb (59.4 kg)   SpO2 98%   BMI 21.14 kg/m  General: Well Developed, well nourished, and in no acute distress.   MSK: Normal motion and gait       Assessment and Plan: 73 y.o. female with osteoporosis with a T-score of -2.6 without a fracture history.  Patient is doing a pretty good job with conservative management doing weightbearing exercise and adequate calcium and vitamin D.  Despite this her bone density has worsened.  We spent a fair amount of time discussing her medical options.  After discussion we will recommend using an anabolic agent like Tymlos or teriparatide first before using a stabilizing medicine like Reclast infusion.  However she is not quite sure which she would like to do and will contact my office and let me know in a week or 2.   PDMP not reviewed this encounter. No orders of  the defined types were placed in this encounter.  No orders of the defined types were placed in this encounter.    Discussed warning signs or symptoms. Please see discharge instructions. Patient expresses understanding.   The above documentation has been reviewed and is accurate and complete Artist Lloyd, M.D.

## 2023-09-25 ENCOUNTER — Ambulatory Visit: Admitting: Family Medicine

## 2023-09-25 VITALS — BP 120/74 | HR 83 | Ht 66.0 in | Wt 131.0 lb

## 2023-09-25 DIAGNOSIS — M810 Age-related osteoporosis without current pathological fracture: Secondary | ICD-10-CM | POA: Diagnosis not present

## 2023-09-25 NOTE — Patient Instructions (Signed)
 Thank you for coming in today.   Add resistance training  I recommend Tymlos  Let me know what you decide

## 2023-09-28 ENCOUNTER — Encounter: Payer: Self-pay | Admitting: Family Medicine

## 2023-10-03 DIAGNOSIS — N1831 Chronic kidney disease, stage 3a: Secondary | ICD-10-CM | POA: Diagnosis not present

## 2023-10-03 DIAGNOSIS — N2581 Secondary hyperparathyroidism of renal origin: Secondary | ICD-10-CM | POA: Diagnosis not present

## 2023-10-03 DIAGNOSIS — M810 Age-related osteoporosis without current pathological fracture: Secondary | ICD-10-CM | POA: Diagnosis not present

## 2023-10-03 DIAGNOSIS — Z23 Encounter for immunization: Secondary | ICD-10-CM | POA: Diagnosis not present

## 2023-10-04 NOTE — Telephone Encounter (Signed)
 Forwarding to Dr. Denyse Amass as Lorain Childes.

## 2023-10-05 ENCOUNTER — Encounter: Payer: Self-pay | Admitting: Obstetrics and Gynecology

## 2023-11-22 DIAGNOSIS — D649 Anemia, unspecified: Secondary | ICD-10-CM | POA: Diagnosis not present

## 2023-11-22 DIAGNOSIS — E7849 Other hyperlipidemia: Secondary | ICD-10-CM | POA: Diagnosis not present

## 2023-11-22 DIAGNOSIS — N189 Chronic kidney disease, unspecified: Secondary | ICD-10-CM | POA: Diagnosis not present

## 2023-12-06 DIAGNOSIS — Z01419 Encounter for gynecological examination (general) (routine) without abnormal findings: Secondary | ICD-10-CM | POA: Diagnosis not present

## 2023-12-06 DIAGNOSIS — Z6823 Body mass index (BMI) 23.0-23.9, adult: Secondary | ICD-10-CM | POA: Diagnosis not present
